# Patient Record
Sex: Female | Born: 1995 | Hispanic: No | Marital: Married | Smoking: Never smoker
Health system: Southern US, Community
[De-identification: ages and names within clinical notes are randomized; demographics above are authoritative.]

## PROBLEM LIST (undated history)

## (undated) ENCOUNTER — Inpatient Hospital Stay (HOSPITAL_COMMUNITY): Payer: Self-pay

## (undated) DIAGNOSIS — E119 Type 2 diabetes mellitus without complications: Secondary | ICD-10-CM

## (undated) DIAGNOSIS — D649 Anemia, unspecified: Secondary | ICD-10-CM

## (undated) DIAGNOSIS — R002 Palpitations: Secondary | ICD-10-CM

## (undated) DIAGNOSIS — F419 Anxiety disorder, unspecified: Secondary | ICD-10-CM

## (undated) DIAGNOSIS — G43909 Migraine, unspecified, not intractable, without status migrainosus: Secondary | ICD-10-CM

## (undated) DIAGNOSIS — J45909 Unspecified asthma, uncomplicated: Secondary | ICD-10-CM

## (undated) DIAGNOSIS — F32A Depression, unspecified: Secondary | ICD-10-CM

## (undated) HISTORY — DX: Unspecified asthma, uncomplicated: J45.909

## (undated) HISTORY — PX: CYST REMOVAL NECK: SHX6281

## (undated) HISTORY — PX: NECK SURGERY: SHX720

## (undated) HISTORY — DX: Palpitations: R00.2

## (undated) HISTORY — DX: Migraine, unspecified, not intractable, without status migrainosus: G43.909

---

## 2020-08-20 DIAGNOSIS — L249 Irritant contact dermatitis, unspecified cause: Secondary | ICD-10-CM | POA: Diagnosis not present

## 2020-09-10 DIAGNOSIS — Z3202 Encounter for pregnancy test, result negative: Secondary | ICD-10-CM | POA: Diagnosis not present

## 2020-09-14 DIAGNOSIS — Z3201 Encounter for pregnancy test, result positive: Secondary | ICD-10-CM | POA: Diagnosis not present

## 2020-09-15 DIAGNOSIS — Z3A00 Weeks of gestation of pregnancy not specified: Secondary | ICD-10-CM | POA: Diagnosis not present

## 2020-09-15 DIAGNOSIS — Z20822 Contact with and (suspected) exposure to covid-19: Secondary | ICD-10-CM | POA: Diagnosis not present

## 2020-09-15 DIAGNOSIS — O24311 Unspecified pre-existing diabetes mellitus in pregnancy, first trimester: Secondary | ICD-10-CM | POA: Diagnosis not present

## 2020-09-15 DIAGNOSIS — Z3A01 Less than 8 weeks gestation of pregnancy: Secondary | ICD-10-CM | POA: Diagnosis not present

## 2020-09-15 DIAGNOSIS — R109 Unspecified abdominal pain: Secondary | ICD-10-CM | POA: Diagnosis not present

## 2020-09-15 DIAGNOSIS — O26891 Other specified pregnancy related conditions, first trimester: Secondary | ICD-10-CM | POA: Diagnosis not present

## 2020-09-15 DIAGNOSIS — E1165 Type 2 diabetes mellitus with hyperglycemia: Secondary | ICD-10-CM | POA: Diagnosis not present

## 2020-09-15 DIAGNOSIS — R42 Dizziness and giddiness: Secondary | ICD-10-CM | POA: Diagnosis not present

## 2020-09-15 DIAGNOSIS — O99891 Other specified diseases and conditions complicating pregnancy: Secondary | ICD-10-CM | POA: Diagnosis not present

## 2020-09-15 DIAGNOSIS — Z794 Long term (current) use of insulin: Secondary | ICD-10-CM | POA: Diagnosis not present

## 2020-09-19 DIAGNOSIS — Z8759 Personal history of other complications of pregnancy, childbirth and the puerperium: Secondary | ICD-10-CM | POA: Diagnosis not present

## 2020-09-19 DIAGNOSIS — E119 Type 2 diabetes mellitus without complications: Secondary | ICD-10-CM | POA: Diagnosis not present

## 2020-09-19 DIAGNOSIS — Z3201 Encounter for pregnancy test, result positive: Secondary | ICD-10-CM | POA: Diagnosis not present

## 2020-09-19 DIAGNOSIS — Z3689 Encounter for other specified antenatal screening: Secondary | ICD-10-CM | POA: Diagnosis not present

## 2020-09-19 LAB — OB RESULTS CONSOLE HEPATITIS B SURFACE ANTIGEN: Hepatitis B Surface Ag: NEGATIVE

## 2020-09-19 LAB — OB RESULTS CONSOLE GC/CHLAMYDIA
Chlamydia: NEGATIVE
Gonorrhea: NEGATIVE

## 2020-09-19 LAB — OB RESULTS CONSOLE HIV ANTIBODY (ROUTINE TESTING): HIV: NONREACTIVE

## 2020-09-19 LAB — OB RESULTS CONSOLE RUBELLA ANTIBODY, IGM: Rubella: IMMUNE

## 2020-09-19 LAB — OB RESULTS CONSOLE ABO/RH: RH Type: POSITIVE

## 2020-09-19 LAB — OB RESULTS CONSOLE RPR: RPR: NONREACTIVE

## 2020-09-19 LAB — OB RESULTS CONSOLE ANTIBODY SCREEN: Antibody Screen: NEGATIVE

## 2020-09-19 LAB — HEPATITIS C ANTIBODY: HCV Ab: NEGATIVE

## 2020-09-21 DIAGNOSIS — Z8759 Personal history of other complications of pregnancy, childbirth and the puerperium: Secondary | ICD-10-CM | POA: Diagnosis not present

## 2020-09-27 DIAGNOSIS — K219 Gastro-esophageal reflux disease without esophagitis: Secondary | ICD-10-CM | POA: Diagnosis not present

## 2020-09-27 DIAGNOSIS — O200 Threatened abortion: Secondary | ICD-10-CM | POA: Diagnosis not present

## 2020-09-27 DIAGNOSIS — O24119 Pre-existing diabetes mellitus, type 2, in pregnancy, unspecified trimester: Secondary | ICD-10-CM | POA: Diagnosis not present

## 2020-10-11 DIAGNOSIS — Z3201 Encounter for pregnancy test, result positive: Secondary | ICD-10-CM | POA: Diagnosis not present

## 2020-10-17 ENCOUNTER — Other Ambulatory Visit: Payer: Self-pay

## 2020-10-17 ENCOUNTER — Encounter: Payer: Self-pay | Attending: Obstetrics | Admitting: Registered"

## 2020-10-17 ENCOUNTER — Encounter: Payer: Self-pay | Admitting: Registered"

## 2020-10-17 DIAGNOSIS — O24119 Pre-existing diabetes mellitus, type 2, in pregnancy, unspecified trimester: Secondary | ICD-10-CM | POA: Insufficient documentation

## 2020-10-17 NOTE — Progress Notes (Signed)
Patient has Type 2 Diabetes in Pregnancy and was given the option to have a one on one visit with dietitian. Patient decided to stay for Gestational Diabetes self-management class. Much of the same information applies to Type 2 in pregnancy. Patient stayed after class for some additional information regarding the difference between gestational diabetes and Type 2 diabetes in pregnancy.  The following learning objectives were met by the patient during this course:  States the definition of Gestational Diabetes States why dietary management is important in controlling blood glucose Describes the effects each nutrient has on blood glucose levels Demonstrates ability to create a balanced meal plan Demonstrates carbohydrate counting  States when to check blood glucose levels Demonstrates proper blood glucose monitoring techniques States the effect of stress and exercise on blood glucose levels States the importance of limiting caffeine and abstaining from alcohol and smoking  Blood glucose monitor given: Patient has meter and is checking blood sugar prior to class   Patient instructed to monitor glucose levels: FBS: 60 - <95; 1 hour: <140; 2 hour: <120  Patient received handouts: Nutrition Diabetes and Pregnancy, including carb counting list A1c chart with diagnosing criteria for Type 2 Diabetes  Patient will be seen for follow-up as needed.

## 2020-10-31 DIAGNOSIS — Z3689 Encounter for other specified antenatal screening: Secondary | ICD-10-CM | POA: Diagnosis not present

## 2020-10-31 DIAGNOSIS — O24111 Pre-existing diabetes mellitus, type 2, in pregnancy, first trimester: Secondary | ICD-10-CM | POA: Diagnosis not present

## 2020-10-31 DIAGNOSIS — Z3A10 10 weeks gestation of pregnancy: Secondary | ICD-10-CM | POA: Diagnosis not present

## 2020-11-16 DIAGNOSIS — Z23 Encounter for immunization: Secondary | ICD-10-CM | POA: Diagnosis not present

## 2020-11-16 DIAGNOSIS — Z3A12 12 weeks gestation of pregnancy: Secondary | ICD-10-CM | POA: Diagnosis not present

## 2020-11-16 DIAGNOSIS — O368990 Maternal care for other specified fetal problems, unspecified trimester, not applicable or unspecified: Secondary | ICD-10-CM | POA: Diagnosis not present

## 2020-11-29 DIAGNOSIS — U071 COVID-19: Secondary | ICD-10-CM | POA: Diagnosis not present

## 2020-11-29 DIAGNOSIS — R070 Pain in throat: Secondary | ICD-10-CM | POA: Diagnosis not present

## 2020-11-29 DIAGNOSIS — Z20822 Contact with and (suspected) exposure to covid-19: Secondary | ICD-10-CM | POA: Diagnosis not present

## 2020-12-12 DIAGNOSIS — O26892 Other specified pregnancy related conditions, second trimester: Secondary | ICD-10-CM | POA: Diagnosis not present

## 2020-12-12 DIAGNOSIS — O26899 Other specified pregnancy related conditions, unspecified trimester: Secondary | ICD-10-CM | POA: Diagnosis not present

## 2020-12-12 DIAGNOSIS — Z361 Encounter for antenatal screening for raised alphafetoprotein level: Secondary | ICD-10-CM | POA: Diagnosis not present

## 2020-12-12 DIAGNOSIS — Z3A16 16 weeks gestation of pregnancy: Secondary | ICD-10-CM | POA: Diagnosis not present

## 2020-12-12 DIAGNOSIS — O24419 Gestational diabetes mellitus in pregnancy, unspecified control: Secondary | ICD-10-CM | POA: Diagnosis not present

## 2020-12-22 ENCOUNTER — Emergency Department (HOSPITAL_COMMUNITY)
Admission: EM | Admit: 2020-12-22 | Discharge: 2020-12-22 | Disposition: A | Discharge status: Home / Self Care | Payer: BC Managed Care – PPO | Attending: Emergency Medicine | Admitting: Emergency Medicine

## 2020-12-22 ENCOUNTER — Emergency Department (HOSPITAL_COMMUNITY): Payer: BC Managed Care – PPO

## 2020-12-22 ENCOUNTER — Encounter (HOSPITAL_COMMUNITY): Payer: Self-pay

## 2020-12-22 DIAGNOSIS — O209 Hemorrhage in early pregnancy, unspecified: Secondary | ICD-10-CM | POA: Insufficient documentation

## 2020-12-22 DIAGNOSIS — Z3A18 18 weeks gestation of pregnancy: Secondary | ICD-10-CM | POA: Insufficient documentation

## 2020-12-22 DIAGNOSIS — K5641 Fecal impaction: Secondary | ICD-10-CM

## 2020-12-22 DIAGNOSIS — N939 Abnormal uterine and vaginal bleeding, unspecified: Secondary | ICD-10-CM

## 2020-12-22 DIAGNOSIS — O99612 Diseases of the digestive system complicating pregnancy, second trimester: Secondary | ICD-10-CM | POA: Diagnosis not present

## 2020-12-22 DIAGNOSIS — K5900 Constipation, unspecified: Secondary | ICD-10-CM | POA: Insufficient documentation

## 2020-12-22 LAB — CBC WITH DIFFERENTIAL/PLATELET
Abs Immature Granulocytes: 0.05 10*3/uL (ref 0.00–0.07)
Basophils Absolute: 0 10*3/uL (ref 0.0–0.1)
Basophils Relative: 0 %
Eosinophils Absolute: 0.1 10*3/uL (ref 0.0–0.5)
Eosinophils Relative: 0 %
HCT: 37.6 % (ref 36.0–46.0)
Hemoglobin: 12.8 g/dL (ref 12.0–15.0)
Immature Granulocytes: 0 %
Lymphocytes Relative: 18 %
Lymphs Abs: 2 10*3/uL (ref 0.7–4.0)
MCH: 29 pg (ref 26.0–34.0)
MCHC: 34 g/dL (ref 30.0–36.0)
MCV: 85.1 fL (ref 80.0–100.0)
Monocytes Absolute: 0.7 10*3/uL (ref 0.1–1.0)
Monocytes Relative: 6 %
Neutro Abs: 8.7 10*3/uL — ABNORMAL HIGH (ref 1.7–7.7)
Neutrophils Relative %: 76 %
Platelets: 248 10*3/uL (ref 150–400)
RBC: 4.42 MIL/uL (ref 3.87–5.11)
RDW: 13.2 % (ref 11.5–15.5)
WBC: 11.6 10*3/uL — ABNORMAL HIGH (ref 4.0–10.5)
nRBC: 0 % (ref 0.0–0.2)

## 2020-12-22 LAB — COMPREHENSIVE METABOLIC PANEL
ALT: 15 U/L (ref 0–44)
AST: 24 U/L (ref 15–41)
Albumin: 3.5 g/dL (ref 3.5–5.0)
Alkaline Phosphatase: 44 U/L (ref 38–126)
Anion gap: 9 (ref 5–15)
BUN: 14 mg/dL (ref 6–20)
CO2: 18 mmol/L — ABNORMAL LOW (ref 22–32)
Calcium: 9.2 mg/dL (ref 8.9–10.3)
Chloride: 107 mmol/L (ref 98–111)
Creatinine, Ser: 0.51 mg/dL (ref 0.44–1.00)
GFR, Estimated: >60 mL/min (ref >60)
Glucose, Bld: 121 mg/dL — ABNORMAL HIGH (ref 70–99)
Potassium: 4 mmol/L (ref 3.5–5.1)
Sodium: 134 mmol/L — ABNORMAL LOW (ref 135–145)
Total Bilirubin: 0.5 mg/dL (ref 0.3–1.2)
Total Protein: 7.7 g/dL (ref 6.5–8.1)

## 2020-12-22 LAB — PROTIME-INR
INR: 0.9 (ref 0.8–1.2)
Prothrombin Time: 12.5 seconds (ref 11.4–15.2)

## 2020-12-22 LAB — HCG, QUANTITATIVE, PREGNANCY: hCG, Beta Chain, Quant, S: 30317 m[IU]/mL — ABNORMAL HIGH (ref <5)

## 2020-12-22 LAB — TYPE AND SCREEN
ABO/RH(D): O POS
Antibody Screen: NEGATIVE

## 2020-12-22 MED ORDER — FLEET ENEMA 7-19 GM/118ML RE ENEM
1.0000 | ENEMA | Freq: Once | RECTAL | Status: AC
  Administered 2020-12-22: 1 via RECTAL
  Filled 2020-12-22: qty 1

## 2020-12-22 NOTE — ED Provider Notes (Signed)
[facility]-EMERGENCY DEPT Provider Note   CSN: [id number] Arrival date & time: 12/22/20  7412     History Chief Complaint  Patient presents with   Constipation    Allison Friedman is a 25 y.o. female.  HPI    25 year old female comes in with chief complaint of constipation and questionable bleeding.  Patient is G2 P1, her first pregnancy ended in miscarriage.  She reports that she has been constipated throughout this pregnancy.  Today when she was straining, she felt like she had stretching sensation around her rectum.  She also noted that there was blood in the commode.  She is unsure if she had vaginal bleeding or bleeding from her rectum due to constipation and straining.  So far there has not been any complications from this pregnancy.   History reviewed. No pertinent past medical history.  There are no problems to display for this patient.   History reviewed. No pertinent surgical history.   OB History     Gravida  1   Para      Term      Preterm      AB      Living         SAB      IAB      Ectopic      Multiple      Live Births              History reviewed. No pertinent family history.  Social History   Tobacco Use   Smoking status: Never   Smokeless tobacco: Never    Home Medications Prior to Admission medications   Not on File    Allergies    Patient has no known allergies.  Review of Systems   Review of Systems  Constitutional:  Positive for activity change.  Gastrointestinal:  Positive for constipation.  All other systems reviewed and are negative.  Physical Exam Updated Vital Signs BP 123/74   Pulse 96   Temp 99.2 F (37.3 C) (Oral)   Resp 20   SpO2 97%   Physical Exam Vitals and nursing note reviewed.  Constitutional:      Appearance: She is well-developed.  HENT:     Head: Atraumatic.  Cardiovascular:     Rate and Rhythm: Normal rate.  Pulmonary:     Effort: Pulmonary effort is  normal.  Abdominal:     Comments: Fecal impaction appreciated  Musculoskeletal:     Cervical back: Normal range of motion and neck supple.  Skin:    General: Skin is warm and dry.  Neurological:     Mental Status: She is alert and oriented to person, place, and time.    ED Results / Procedures / Treatments   Labs (all labs ordered are listed, but only abnormal results are displayed) Labs Reviewed  COMPREHENSIVE METABOLIC PANEL - Abnormal; Notable for the following components:      Result Value   Sodium 134 (*)    CO2 18 (*)    Glucose, Bld 121 (*)    All other components within normal limits  CBC WITH DIFFERENTIAL/PLATELET - Abnormal; Notable for the following components:   WBC 11.6 (*)    Neutro Abs 8.7 (*)    All other components within normal limits  HCG, QUANTITATIVE, PREGNANCY - Abnormal; Notable for the following components:   hCG, Beta Chain, Quant, S 30,317 (*)    All other components within normal limits  PROTIME-INR  TYPE AND SCREEN  ABO/RH    EKG None  Radiology US OB Comp + 14 Wk  Result Date: 12/22/2020 CLINICAL DATA:  Vaginal bleeding EXAM: OBSTETRIC <14 WK ULTRASOUND TECHNIQUE: Transabdominal ultrasound was performed for evaluation of the gestation as well as the maternal uterus and adnexal regions. COMPARISON:  09/15/2020 FINDINGS: Single live intrauterine gestation is seen. Placenta is along the anterior wall. Amount of fluid around the fetus is unremarkable. Cervix is closed. Heart Rate: 149 bpm Biparietal diameter is 4.06 cm suggesting gestational age of [redacted] weeks 2 days. Estimated date of delivery is 05/23/2021. Subchorionic hemorrhage:  None visualized. Maternal uterus/adnexae: Uterus is unremarkable. Ovaries are not adequately visualized. There are no adnexal masses. There is no free fluid in the pelvis. IMPRESSION: Single live intrauterine pregnancy seen. Sonographically estimated gestational age is 18 weeks 2 days. There is no demonstrable subchorionic  bleed. Cervix is closed. Electronically Signed   By: Ernie Avena M.D.   On: 12/22/2020 14:02    Procedures Fecal disimpaction  Date/Time: 12/22/2020 3:30 PM Performed by: Derwood Kaplan, MD Authorized by: Derwood Kaplan, MD  Consent: Verbal consent obtained. Risks and benefits: risks, benefits and alternatives were discussed Consent given by: patient Patient understanding: patient states understanding of the procedure being performed Patient identity confirmed: arm band Preparation: Patient was prepped and draped in the usual sterile fashion. Local anesthesia used: no  Anesthesia: Local anesthesia used: no  Sedation: Patient sedated: no  Patient tolerance: patient tolerated the procedure well with no immediate complications     Medications Ordered in ED Medications  sodium phosphate (FLEET) 7-19 GM/118ML enema 1 enema (1 enema Rectal Given 12/22/20 4356)    ED Course  I have reviewed the triage vital signs and the nursing notes.  Pertinent labs & imaging results that were available during my care of the patient were reviewed by me and considered in my medical decision making (see chart for details).    MDM Rules/Calculators/A&P                          25 year old female comes in with chief complaint of constipation.  She was noted to have fecal impaction.  She was manually disimpacted.  We did not see any blood in the stool.  Patient did notice some blood in her commode earlier today and is concerned about vaginal bleeding.  She had miscarriage with her first pregnancy and wants to make sure everything is fine with this pregnancy.  No pelvic pain, back pain.  After much discussion with the patient, ultrasound pelvis was ordered and it confirms successful IUP.  No evidence of any placental disruption or chorionic bleeding.  Patient was reassured. She also received enema, there after she had a large BM and which was helpful in improving her symptoms.  Stable for  discharge.  Final Clinical Impression(s) / ED Diagnoses Final diagnoses:  Constipation during pregnancy in second trimester  Fecal impaction [facility])  Vaginal bleeding    Rx / DC Orders ED Discharge Orders     None        Derwood Kaplan, MD 12/22/20 1532

## 2020-12-22 NOTE — Discharge Instructions (Addendum)
Ultrasound shows 18 weeks and 2 day old fetus/baby. Hydrate well.

## 2020-12-22 NOTE — ED Triage Notes (Signed)
Pt arrived via POV, c/o constipation since yesterday. States she also saw some bright red blood while trying to go to bathroom. States she has been able to have bowel mvmts this week but stool has been hard.   States [redacted]wks pregnant

## 2021-01-07 DIAGNOSIS — Z3A20 20 weeks gestation of pregnancy: Secondary | ICD-10-CM | POA: Diagnosis not present

## 2021-01-07 DIAGNOSIS — Z363 Encounter for antenatal screening for malformations: Secondary | ICD-10-CM | POA: Diagnosis not present

## 2021-01-07 DIAGNOSIS — R5383 Other fatigue: Secondary | ICD-10-CM | POA: Diagnosis not present

## 2021-01-24 DIAGNOSIS — Z362 Encounter for other antenatal screening follow-up: Secondary | ICD-10-CM | POA: Diagnosis not present

## 2021-02-20 DIAGNOSIS — Z3689 Encounter for other specified antenatal screening: Secondary | ICD-10-CM | POA: Diagnosis not present

## 2021-02-20 DIAGNOSIS — O24419 Gestational diabetes mellitus in pregnancy, unspecified control: Secondary | ICD-10-CM | POA: Diagnosis not present

## 2021-03-08 DIAGNOSIS — Z3689 Encounter for other specified antenatal screening: Secondary | ICD-10-CM | POA: Diagnosis not present

## 2021-03-08 DIAGNOSIS — Z362 Encounter for other antenatal screening follow-up: Secondary | ICD-10-CM | POA: Diagnosis not present

## 2021-03-08 DIAGNOSIS — Z23 Encounter for immunization: Secondary | ICD-10-CM | POA: Diagnosis not present

## 2021-03-12 ENCOUNTER — Other Ambulatory Visit: Payer: Self-pay

## 2021-03-12 ENCOUNTER — Inpatient Hospital Stay (HOSPITAL_COMMUNITY)
Admission: AD | Admit: 2021-03-12 | Discharge: 2021-03-12 | Disposition: A | Discharge status: Home / Self Care | Payer: BC Managed Care – PPO | Attending: Obstetrics and Gynecology | Admitting: Obstetrics and Gynecology

## 2021-03-12 ENCOUNTER — Encounter (HOSPITAL_COMMUNITY): Payer: Self-pay | Admitting: Obstetrics and Gynecology

## 2021-03-12 DIAGNOSIS — Z3A29 29 weeks gestation of pregnancy: Secondary | ICD-10-CM | POA: Diagnosis not present

## 2021-03-12 DIAGNOSIS — O24113 Pre-existing diabetes mellitus, type 2, in pregnancy, third trimester: Secondary | ICD-10-CM | POA: Insufficient documentation

## 2021-03-12 DIAGNOSIS — O368130 Decreased fetal movements, third trimester, not applicable or unspecified: Secondary | ICD-10-CM | POA: Insufficient documentation

## 2021-03-12 HISTORY — DX: Type 2 diabetes mellitus without complications: E11.9

## 2021-03-12 HISTORY — DX: Anemia, unspecified: D64.9

## 2021-03-12 NOTE — MAU Note (Addendum)
..  Allison Friedman is a 25 y.o. at [redacted]w[redacted]d here in MAU reporting: DFM - last movement felt at 0600 on 03/11/2021. Pt states baby is usually very active. Pt reports having eaten and is hydrated without change in movement status. Pt denies SROM, vaginal bleeding or bloody show. Pt denies pain, contractions or cramping.  Vitals:   03/12/21 0237  BP: (!) 146/83  Pulse: 90  Resp: 18  Temp: 98.6 F (37 C)  SpO2: 100%     FHT:152

## 2021-03-12 NOTE — MAU Provider Note (Signed)
History     CSN: [id number]  Arrival date and time: 03/12/21 0221   None     Chief Complaint  Patient presents with   Decreased Fetal Movement   HPI Allison Friedman is a 26 y.o. G1P0 at [redacted]w[redacted]d who presents to MAU for decreased fetal movement. Pregnancy is significant for T2DM Patient reports prior to MAU arrival, she had not felt baby move since 0600 yesterday morning. She has tried eating and drinking, but nothing has helped. Since her arrival to MAU, she reports she has felt baby move several times. She denies contractions, leaking fluid, or vaginal bleeding. No headaches, visual disturbances or RUQ pain.  OB History     Gravida  2   Para      Term      Preterm      AB  1   Living  0      SAB  1   IAB      Ectopic      Multiple      Live Births              Past Medical History:  Diagnosis Date   Anemia    Diabetes mellitus without complication (HCC)     Past Surgical History:  Procedure Laterality Date   NECK SURGERY      Family History  Problem Relation Age of Onset   Hypertension Mother     Social History   Tobacco Use   Smoking status: Never   Smokeless tobacco: Never  Vaping Use   Vaping Use: Never used  Substance Use Topics   Alcohol use: Never   Drug use: Never    Allergies: No Known Allergies  Medications Prior to Admission  Medication Sig Dispense Refill Last Dose   ferrous sulfate 325 (65 FE) MG tablet Take 325 mg by mouth daily with breakfast.   03/11/2021   insulin glargine (LANTUS) 100 UNIT/ML injection Inject 40 Units into the skin at bedtime.   03/11/2021   Pediatric Multiple Vitamins (FLINSTONES GUMMIES OMEGA-3 DHA PO) Take by mouth.   03/11/2021    Review of Systems  Constitutional: Negative.   Respiratory: Negative.    Cardiovascular: Negative.   Gastrointestinal: Negative.   Genitourinary: Negative.   Musculoskeletal: Negative.   Skin: Negative.   Neurological: Negative.    Physical Exam   Patient Vitals  for the past 24 hrs:  BP Temp Temp src Pulse Resp SpO2 Height Weight  03/12/21 0450 117/70 98.4 F (36.9 C) Oral 86 17 99 % -- --  03/12/21 0331 103/74 -- -- 83 -- -- -- --  03/12/21 0324 111/65 -- -- 82 -- -- -- --  03/12/21 0306 (!) 95/41 98.7 F (37.1 C) Oral 87 18 98 % -- --  03/12/21 0237 (!) 146/83 98.6 F (37 C) Oral 90 18 100 % 5\' 4"  (1.626 m) 132 kg   Physical Exam Vitals and nursing note reviewed.  Constitutional:      General: She is not in acute distress.    Appearance: She is obese.  Eyes:     Extraocular Movements: Extraocular movements intact.     Pupils: Pupils are equal, round, and reactive to light.  Cardiovascular:     Rate and Rhythm: Normal rate.  Pulmonary:     Effort: Pulmonary effort is normal.  Abdominal:     Palpations: Abdomen is soft.     Tenderness: There is no abdominal tenderness.     Comments: Gravid   Musculoskeletal:  General: Normal range of motion.     Cervical back: Normal range of motion.  Skin:    General: Skin is warm and dry.  Neurological:     General: No focal deficit present.     Mental Status: She is alert and oriented to person, place, and time.  Psychiatric:        Mood and Affect: Mood normal.        Behavior: Behavior normal.        Thought Content: Thought content normal.        Judgment: Judgment normal.   NST FHR: 140 bpm, moderate variability, +10x10 accels, no decels Toco: quiet  MAU Course  Procedures NST  MDM NST reassuring for gestational age. NST clicker given to patient at approximately 0315 and pushed 20 times in 1 hr. Patient reports feeling normal fetal movement since arrival to MAU and being placed on monitors. Initial BP elevated, however subsequent BP's normotensive. Patient asymptomatic.   Assessment and Plan  [redacted] weeks gestation of pregnancy Decreased fetal movement   - Discharge home in stable condition - Reviewed fetal kick counts - Strict return precautions reviewed. Return to MAU as  needed for worsening symptoms - Keep OB appointment as scheduled    Brand Males, CNM 03/12/2021, 4:53 AM

## 2021-03-18 DIAGNOSIS — Z3A30 30 weeks gestation of pregnancy: Secondary | ICD-10-CM | POA: Diagnosis not present

## 2021-03-18 DIAGNOSIS — Z0371 Encounter for suspected problem with amniotic cavity and membrane ruled out: Secondary | ICD-10-CM | POA: Diagnosis not present

## 2021-03-18 DIAGNOSIS — O24419 Gestational diabetes mellitus in pregnancy, unspecified control: Secondary | ICD-10-CM | POA: Diagnosis not present

## 2021-03-27 DIAGNOSIS — O24113 Pre-existing diabetes mellitus, type 2, in pregnancy, third trimester: Secondary | ICD-10-CM | POA: Diagnosis not present

## 2021-03-27 DIAGNOSIS — Z3A31 31 weeks gestation of pregnancy: Secondary | ICD-10-CM | POA: Diagnosis not present

## 2021-04-03 DIAGNOSIS — O24419 Gestational diabetes mellitus in pregnancy, unspecified control: Secondary | ICD-10-CM | POA: Diagnosis not present

## 2021-04-03 DIAGNOSIS — Z3A32 32 weeks gestation of pregnancy: Secondary | ICD-10-CM | POA: Diagnosis not present

## 2021-04-04 DIAGNOSIS — R109 Unspecified abdominal pain: Secondary | ICD-10-CM | POA: Diagnosis not present

## 2021-04-04 DIAGNOSIS — Z3A32 32 weeks gestation of pregnancy: Secondary | ICD-10-CM | POA: Diagnosis not present

## 2021-04-04 DIAGNOSIS — O368130 Decreased fetal movements, third trimester, not applicable or unspecified: Secondary | ICD-10-CM | POA: Diagnosis not present

## 2021-04-04 DIAGNOSIS — O24113 Pre-existing diabetes mellitus, type 2, in pregnancy, third trimester: Secondary | ICD-10-CM | POA: Diagnosis not present

## 2021-04-08 DIAGNOSIS — E119 Type 2 diabetes mellitus without complications: Secondary | ICD-10-CM | POA: Diagnosis not present

## 2021-04-08 DIAGNOSIS — Z3A33 33 weeks gestation of pregnancy: Secondary | ICD-10-CM | POA: Diagnosis not present

## 2021-04-08 DIAGNOSIS — O24113 Pre-existing diabetes mellitus, type 2, in pregnancy, third trimester: Secondary | ICD-10-CM | POA: Diagnosis not present

## 2021-04-11 DIAGNOSIS — Z3A33 33 weeks gestation of pregnancy: Secondary | ICD-10-CM | POA: Diagnosis not present

## 2021-04-11 DIAGNOSIS — O24113 Pre-existing diabetes mellitus, type 2, in pregnancy, third trimester: Secondary | ICD-10-CM | POA: Diagnosis not present

## 2021-04-16 DIAGNOSIS — O24113 Pre-existing diabetes mellitus, type 2, in pregnancy, third trimester: Secondary | ICD-10-CM | POA: Diagnosis not present

## 2021-04-16 DIAGNOSIS — Z3A34 34 weeks gestation of pregnancy: Secondary | ICD-10-CM | POA: Diagnosis not present

## 2021-04-17 ENCOUNTER — Inpatient Hospital Stay (HOSPITAL_BASED_OUTPATIENT_CLINIC_OR_DEPARTMENT_OTHER): Payer: BC Managed Care – PPO

## 2021-04-17 ENCOUNTER — Other Ambulatory Visit: Payer: Self-pay

## 2021-04-17 ENCOUNTER — Encounter (HOSPITAL_COMMUNITY): Payer: Self-pay | Admitting: Obstetrics and Gynecology

## 2021-04-17 ENCOUNTER — Inpatient Hospital Stay (HOSPITAL_COMMUNITY)
Admission: AD | Admit: 2021-04-17 | Discharge: 2021-04-17 | Disposition: A | Discharge status: Home / Self Care | Payer: BC Managed Care – PPO | Attending: Obstetrics and Gynecology | Admitting: Obstetrics and Gynecology

## 2021-04-17 DIAGNOSIS — O368130 Decreased fetal movements, third trimester, not applicable or unspecified: Secondary | ICD-10-CM

## 2021-04-17 DIAGNOSIS — E119 Type 2 diabetes mellitus without complications: Secondary | ICD-10-CM | POA: Diagnosis not present

## 2021-04-17 DIAGNOSIS — O24113 Pre-existing diabetes mellitus, type 2, in pregnancy, third trimester: Secondary | ICD-10-CM | POA: Diagnosis not present

## 2021-04-17 DIAGNOSIS — Z3A34 34 weeks gestation of pregnancy: Secondary | ICD-10-CM

## 2021-04-17 DIAGNOSIS — K5900 Constipation, unspecified: Secondary | ICD-10-CM | POA: Diagnosis not present

## 2021-04-17 DIAGNOSIS — E1165 Type 2 diabetes mellitus with hyperglycemia: Secondary | ICD-10-CM | POA: Diagnosis not present

## 2021-04-17 DIAGNOSIS — O99613 Diseases of the digestive system complicating pregnancy, third trimester: Secondary | ICD-10-CM | POA: Insufficient documentation

## 2021-04-17 DIAGNOSIS — Z3689 Encounter for other specified antenatal screening: Secondary | ICD-10-CM

## 2021-04-17 LAB — URINALYSIS, ROUTINE W REFLEX MICROSCOPIC
Bilirubin Urine: NEGATIVE
Glucose, UA: 50 mg/dL — AB
Hgb urine dipstick: NEGATIVE
Ketones, ur: 5 mg/dL — AB
Nitrite: NEGATIVE
Protein, ur: 30 mg/dL — AB
Specific Gravity, Urine: 1.03 (ref 1.005–1.030)
pH: 6 (ref 5.0–8.0)

## 2021-04-17 NOTE — MAU Note (Signed)
Pt reports she has only felt her baby move once today around 2pm. C/o mild abd cramping as well. Denies any vag bleeding or leaking

## 2021-04-17 NOTE — MAU Provider Note (Addendum)
History     CSN: [id number]  Arrival date and time: 04/17/21 1805   Event Date/Time   First Provider Initiated Contact with Patient 04/17/21 1840      Chief Complaint  Patient presents with   Decreased Fetal Movement   HPI Patient Allison Friedman is 26 y.o. G2P0010  At [redacted]w[redacted]d here with complaints of decreased fetal movements. Baby normally moves in the morning time and at night time. She feels like the movements were decreased on Monday (two days ago). Then, on Tuesday (yesterday), she had some movements. She told Dr. Ernestina Penna yesterday; she had a BPP where she scored "perfect" per patient. She denies any blurry vision, floating spots, she denies NV, diarrhea, chest pain, fever. She denies VB, contractions, LOF. She endorses constipation. She reports that her blood sugar has not been well controlled; she has an appt tomorrow at [facility]. She reports that she went to [employer] today and she went out for lunch for her husband. She reports that she then "laid down in bed" and tried to get her to move. Patient reports that she tried laying down and rubbing the top of her belly to get her to move. She says that she laid there for at least 1.5 hours. She called [facility] this afternoon and they told her to come into MAU.   OB History     Gravida  2   Para      Term      Preterm      AB  1   Living  0      SAB  1   IAB      Ectopic      Multiple      Live Births              Past Medical History:  Diagnosis Date   Anemia    Diabetes mellitus without complication (HCC)     Past Surgical History:  Procedure Laterality Date   NECK SURGERY      Family History  Problem Relation Age of Onset   Hypertension Mother     Social History   Tobacco Use   Smoking status: Never   Smokeless tobacco: Never  Vaping Use   Vaping Use: Never used  Substance Use Topics   Alcohol use: Never   Drug use: Never    Allergies: No Known Allergies  Medications Prior to Admission   Medication Sig Dispense Refill Last Dose   ferrous sulfate 325 (65 FE) MG tablet Take 325 mg by mouth daily with breakfast.   04/17/2021   insulin glargine (LANTUS) 100 UNIT/ML injection Inject 56 Units into the skin at bedtime.   04/16/2021   metFORMIN (GLUCOPHAGE) 500 MG tablet Take by mouth daily after supper.      Pediatric Multiple Vitamins (FLINSTONES GUMMIES OMEGA-3 DHA PO) Take by mouth.   04/17/2021    Review of Systems  Constitutional: Negative.   HENT: Negative.    Eyes: Negative.   Respiratory: Negative.    Cardiovascular: Negative.   Genitourinary: Negative.   Musculoskeletal: Negative.   Neurological: Negative.   Hematological: Negative.   Psychiatric/Behavioral: Negative.    Physical Exam   Blood pressure 107/65, pulse 99, temperature 98.3 F (36.8 C), temperature source Oral, resp. rate 18.  Physical Exam Cardiovascular:     Rate and Rhythm: Normal rate.  Pulmonary:     Effort: Pulmonary effort is normal.  Abdominal:     General: Abdomen is flat.  Genitourinary:  General: Normal vulva.  Skin:    General: Skin is warm.  Neurological:     General: No focal deficit present.     Mental Status: She is alert.  Psychiatric:        Mood and Affect: Mood normal.  Cervix checked upon arrival in MAU due to complaints of occasional side pain, cervix is closed, long, thick.   MAU Course  Procedures  MDM -NST: 140 bpm, mod var, present acel, no decels, no contractions  Patient still endorsing decreased fetal movements; based on poorly controlled DM and report of decreased fetal movements, will send for BPP.   BPP: 8/8 Assessment and Plan   1. NST (non-stress test) reactive   2. [redacted] weeks gestation of pregnancy   -patient has appt with [facility] tomorrow -reviewed BPP results with patient, discussed fetal movements and when to come to MAU. Reassured patient that she can come to MAU whenever she is concerned about movement, and that checking her BS and managing  her DM is the best thing she can do for her baby.  -reviewed other third trimester warning signs; patient verbalized understanding    Marylene Land 04/17/2021, 6:43 PM

## 2021-04-18 DIAGNOSIS — O24113 Pre-existing diabetes mellitus, type 2, in pregnancy, third trimester: Secondary | ICD-10-CM | POA: Diagnosis not present

## 2021-04-18 DIAGNOSIS — O26819 Pregnancy related exhaustion and fatigue, unspecified trimester: Secondary | ICD-10-CM | POA: Diagnosis not present

## 2021-04-18 DIAGNOSIS — O368130 Decreased fetal movements, third trimester, not applicable or unspecified: Secondary | ICD-10-CM | POA: Diagnosis not present

## 2021-04-18 DIAGNOSIS — Z3A34 34 weeks gestation of pregnancy: Secondary | ICD-10-CM | POA: Diagnosis not present

## 2021-04-18 DIAGNOSIS — O26813 Pregnancy related exhaustion and fatigue, third trimester: Secondary | ICD-10-CM | POA: Diagnosis not present

## 2021-04-22 DIAGNOSIS — Z3A35 35 weeks gestation of pregnancy: Secondary | ICD-10-CM | POA: Diagnosis not present

## 2021-04-22 DIAGNOSIS — Z3685 Encounter for antenatal screening for Streptococcus B: Secondary | ICD-10-CM | POA: Diagnosis not present

## 2021-04-22 DIAGNOSIS — R1011 Right upper quadrant pain: Secondary | ICD-10-CM | POA: Diagnosis not present

## 2021-04-22 DIAGNOSIS — O24113 Pre-existing diabetes mellitus, type 2, in pregnancy, third trimester: Secondary | ICD-10-CM | POA: Diagnosis not present

## 2021-04-22 LAB — OB RESULTS CONSOLE GBS: GBS: POSITIVE

## 2021-04-25 DIAGNOSIS — Z3A35 35 weeks gestation of pregnancy: Secondary | ICD-10-CM | POA: Diagnosis not present

## 2021-04-25 DIAGNOSIS — O24113 Pre-existing diabetes mellitus, type 2, in pregnancy, third trimester: Secondary | ICD-10-CM | POA: Diagnosis not present

## 2021-04-29 ENCOUNTER — Telehealth (HOSPITAL_COMMUNITY): Payer: Self-pay | Admitting: *Deleted

## 2021-04-29 NOTE — Telephone Encounter (Signed)
Preadmission screen  

## 2021-04-30 ENCOUNTER — Telehealth (HOSPITAL_COMMUNITY): Payer: Self-pay | Admitting: *Deleted

## 2021-04-30 DIAGNOSIS — Z3685 Encounter for antenatal screening for Streptococcus B: Secondary | ICD-10-CM | POA: Diagnosis not present

## 2021-04-30 NOTE — Telephone Encounter (Signed)
Preadmission screen  

## 2021-05-01 ENCOUNTER — Telehealth (HOSPITAL_COMMUNITY): Payer: Self-pay | Admitting: *Deleted

## 2021-05-01 NOTE — Telephone Encounter (Signed)
Preadmission screen  

## 2021-05-02 ENCOUNTER — Inpatient Hospital Stay (HOSPITAL_COMMUNITY)
Admission: AD | Admit: 2021-05-02 | Discharge: 2021-05-02 | Disposition: A | Discharge status: Home / Self Care | Payer: BC Managed Care – PPO | Attending: Obstetrics & Gynecology | Admitting: Obstetrics & Gynecology

## 2021-05-02 ENCOUNTER — Encounter (HOSPITAL_COMMUNITY): Payer: Self-pay | Admitting: Obstetrics & Gynecology

## 2021-05-02 ENCOUNTER — Encounter (HOSPITAL_COMMUNITY): Payer: Self-pay | Admitting: *Deleted

## 2021-05-02 ENCOUNTER — Other Ambulatory Visit: Payer: Self-pay

## 2021-05-02 ENCOUNTER — Telehealth (HOSPITAL_COMMUNITY): Payer: Self-pay | Admitting: *Deleted

## 2021-05-02 DIAGNOSIS — Z3689 Encounter for other specified antenatal screening: Secondary | ICD-10-CM

## 2021-05-02 DIAGNOSIS — Z3A36 36 weeks gestation of pregnancy: Secondary | ICD-10-CM | POA: Diagnosis not present

## 2021-05-02 DIAGNOSIS — O368130 Decreased fetal movements, third trimester, not applicable or unspecified: Secondary | ICD-10-CM | POA: Diagnosis not present

## 2021-05-02 LAB — URINALYSIS, ROUTINE W REFLEX MICROSCOPIC
Bilirubin Urine: NEGATIVE
Glucose, UA: NEGATIVE mg/dL
Hgb urine dipstick: NEGATIVE
Ketones, ur: NEGATIVE mg/dL
Leukocytes,Ua: NEGATIVE
Nitrite: NEGATIVE
Protein, ur: NEGATIVE mg/dL
Specific Gravity, Urine: 1.02 (ref 1.005–1.030)
pH: 8 (ref 5.0–8.0)

## 2021-05-02 NOTE — Telephone Encounter (Signed)
Preadmission screen  

## 2021-05-02 NOTE — MAU Note (Signed)
Allison Friedman is a 26 y.o. at [redacted]w[redacted]d here in MAU reporting: "I haven't felt her move since 1 o'clock today.. I'm just worried about her". States she ate sweet food, drank lemonade, laid down, but nothing has made baby move. Denies VB or LOF.  ? ?Onset of complaint: 1400 ?Pain score: 5 - pelvic pressure  ?Vitals:  ? 05/02/21 2004  ?BP: 122/63  ?Pulse: 70  ?Resp: 20  ?Temp: 98.7 ?F (37.1 ?C)  ?SpO2: 99%  ?   ?FHT:150 ?Lab orders placed from triage: U/A  ? ?

## 2021-05-02 NOTE — MAU Provider Note (Signed)
?History  ?  ? ?CSN: [id number] ? ?Arrival date and time: 05/02/21 1945 ? ? Event Date/Time  ? First Provider Initiated Contact with Patient 05/02/21 2112   ?  ? ?Chief Complaint  ?Patient presents with  ? Decreased Fetal Movement  ? ?Allison Friedman is a 26 y.o. G2P0010 at [redacted]w[redacted]d who receives care at [facility].  She presents today for Decreased Fetal Movement.  She reports she woke around 1pm and "ate thinks I know she would like and that didn't work."  Patient reports she ate some breakfast sausages with syrup, but had nothing else.  She reports having about 2 cups of water and some lemonade, but no other drinks. She reports taking a shower with no improvement in movement, although that stimulated movement yesterday.  She does endorse movement since arrival and denies abdominal cramping or contractions.  She also denies issues with urination or diarrhea, while reporting constipation that has been present throughout the pregnancy.  Patient reports some pelvic pressure, but denies vaginal concerns including abnormal discharge, bleeding, or leaking.  ? ? ?OB History   ? ? Gravida  ?2  ? Para  ?   ? Term  ?   ? Preterm  ?   ? AB  ?1  ? Living  ?0  ?  ? ? SAB  ?1  ? IAB  ?   ? Ectopic  ?   ? Multiple  ?   ? Live Births  ?   ?   ?  ?  ? ? ?Past Medical History:  ?Diagnosis Date  ? Anemia   ? Diabetes mellitus without complication (HCC)   ? Gestational diabetes   ? ? ?Past Surgical History:  ?Procedure Laterality Date  ? NECK SURGERY    ? ? ?Family History  ?Problem Relation Age of Onset  ? Hypertension Mother   ? ? ?Social History  ? ?Tobacco Use  ? Smoking status: Never  ? Smokeless tobacco: Never  ?Vaping Use  ? Vaping Use: Never used  ?Substance Use Topics  ? Alcohol use: Never  ? Drug use: Never  ? ? ?Allergies: No Known Allergies ? ?Medications Prior to Admission  ?Medication Sig Dispense Refill Last Dose  ? ferrous sulfate 325 (65 FE) MG tablet Take 325 mg by mouth daily with breakfast.   05/01/2021  ? insulin  glargine (LANTUS) 100 UNIT/ML injection Inject 56 Units into the skin at bedtime.   05/01/2021  ? metFORMIN (GLUCOPHAGE) 500 MG tablet Take by mouth daily after supper.   05/01/2021  ? Pediatric Multiple Vitamins (FLINSTONES GUMMIES OMEGA-3 DHA PO) Take by mouth.   05/01/2021  ? ? ?Review of Systems  ?Gastrointestinal:  Positive for constipation. Negative for abdominal pain, diarrhea, nausea and vomiting.  ?Genitourinary:  Positive for pelvic pain (Pressure 5/10). Negative for difficulty urinating, dysuria, vaginal bleeding and vaginal discharge.  ?Neurological:  Negative for headaches.  ?Physical Exam  ? ?Blood pressure 122/63, pulse 70, temperature 98.7 ?F (37.1 ?C), temperature source Oral, resp. rate 20, height 5\' 4"  (1.626 m), weight 133.7 kg, SpO2 99 %. ? ?Physical Exam ?Constitutional:   ?   General: She is not in acute distress. ?   Appearance: Normal appearance. She is obese.  ?HENT:  ?   Head: Normocephalic and atraumatic.  ?Eyes:  ?   Conjunctiva/sclera: Conjunctivae normal.  ?Cardiovascular:  ?   Rate and Rhythm: Normal rate.  ?Abdominal:  ?   Comments: Gravid  ?Musculoskeletal:     ?  General: Normal range of motion.  ?   Cervical back: Normal range of motion.  ?Neurological:  ?   Mental Status: She is alert and oriented to person, place, and time.  ?Psychiatric:     ?   Mood and Affect: Mood normal.     ?   Behavior: Behavior normal.     ?   Thought Content: Thought content normal.  ? ? ?Fetal Assessment ?145 bpm, Mod Var, -Decels, +Accels ?Toco: No Ctx Graphed ? ?MAU Course  ? ?Results for orders placed or performed during the hospital encounter of 05/02/21 (from the past 24 hour(s))  ?Urinalysis, Routine w reflex microscopic Urine, Clean Catch     Status: Abnormal  ? Collection Time: 05/02/21  8:18 PM  ?Result Value Ref Range  ? Color, Urine YELLOW YELLOW  ? APPearance CLOUDY (A) CLEAR  ? Specific Gravity, Urine 1.020 1.005 - 1.030  ? pH 8.0 5.0 - 8.0  ? Glucose, UA NEGATIVE NEGATIVE mg/dL  ? Hgb urine  dipstick NEGATIVE NEGATIVE  ? Bilirubin Urine NEGATIVE NEGATIVE  ? Ketones, ur NEGATIVE NEGATIVE mg/dL  ? Protein, ur NEGATIVE NEGATIVE mg/dL  ? Nitrite NEGATIVE NEGATIVE  ? Leukocytes,Ua NEGATIVE NEGATIVE  ? ?No results found. ? ?MDM ?PE ?Labs: UA ?EFM ?Assessment and Plan  ?26 year old G2P0010  ?SIUP at 36.4 weeks ?Cat I FT ?DFM ? ?-Movement present and reassurances given. ?-Will monitor and if continues to experience appropriate fetal movement, will discharge. ?-Will give liter of water for oral consumption.  ? ? ?Cherre Robins MSN, CNM ?05/02/2021, 9:12 PM  ? ?Reassessment (9:55 PM) ? ?-Reports continued movement. ?-NST reactive ?-Patient reports she is scheduled for IOL next week. ?-Further reports appt tomorrow. ?-Instructed to keep all appts as scheduled. ?-Encouraged to call primary office or return to MAU if symptoms worsen or with the onset of new symptoms. ?-Discharged to home in stable condition. ? ?Cherre Robins MSN, CNM ?Systems developer, [facility] [employer] ? ? ?

## 2021-05-02 NOTE — Progress Notes (Signed)
Total number of clicks: 31 ?

## 2021-05-03 DIAGNOSIS — Z3A36 36 weeks gestation of pregnancy: Secondary | ICD-10-CM | POA: Diagnosis not present

## 2021-05-03 DIAGNOSIS — O24113 Pre-existing diabetes mellitus, type 2, in pregnancy, third trimester: Secondary | ICD-10-CM | POA: Diagnosis not present

## 2021-05-06 ENCOUNTER — Other Ambulatory Visit: Payer: Self-pay | Admitting: Obstetrics

## 2021-05-06 ENCOUNTER — Encounter (HOSPITAL_COMMUNITY): Payer: Self-pay | Admitting: Obstetrics

## 2021-05-06 ENCOUNTER — Other Ambulatory Visit: Payer: Self-pay

## 2021-05-06 NOTE — H&P (Incomplete)
Allison Friedman is a 26 y.o. G2P0010 at [redacted]w[redacted]d presenting for IOL due to poorly controlled T2DM in pregnancy. Pt notes occarional contractions and overall pelvic pains/ discomfort . Not always feeling adequate fetal movement, No vaginal bleeding, not leaking fluid.  [facility] at [facility] since 7 wks - Dated by 7 wk u/s, unsure LMP - Pre-existing T2DM which she had been 'managing on her her own'. Starting A1C 9.2. Started on metformin 500 bid and quickly moved to Lantus, up to 68 units qhs by the end of the pregnancy. Nl fetal echo. Morbid obesity, 27# wt gain. Baby ASA, 3rd trimester testing with reactive fetal status. A1C dropped to 6.1 by the middle of the pregnancy but then despite increasing Lantus, A1C increased to 7.1 1 month ago and FBS in low 100s-120s and PP up to 200.s MFM consult/ curbside with Dr. Grace Bushy recc delivery at 37 wks - Anxiety/ depression. On Lexapro - Baseline elevation in AST/ALT into 40's, normalized into teens through pregnancy - GERD - GBS positive.    Prenatal Transfer Tool  Maternal Diabetes: Yes:  Diabetes Type:  Pre-pregnancy Genetic Screening: Normal Maternal Ultrasounds/Referrals: Normal Fetal Ultrasounds or other Referrals:  Fetal echo Maternal Substance Abuse:  No Significant Maternal Medications:  Meds include: Other:  Significant Maternal Lab Results: Group B Strep positive     OB History     Gravida  2   Para      Term      Preterm      AB  1   Living  0      SAB  1   IAB      Ectopic      Multiple      Live Births             Past Medical History:  Diagnosis Date   Anemia    Anxiety    Depression    Diabetes mellitus without complication (HCC)    Past Surgical History:  Procedure Laterality Date   NECK SURGERY     Family History: family history includes Hypertension in her mother. Social History:  reports that she has never smoked. She has never used smokeless tobacco. She reports that she does not drink alcohol  and does not use drugs.  Review of Systems - Negative except discomfort of pregnancy, anxiety     There were no vitals taken for this visit.  Physical Exam: *** Gen: well appearing, no distress CV: RRR Pulm: CTAB Back: no CVAT Abd: gravid, NT, no RUQ pain LE: 1+ edema, equal bilaterally, non-tender Toco: *** FH: baseline ***, accelerations present, no deceleratons, 10 beat variability  Prenatal labs: ABO, Rh: --/--/O POS (10/29 3664) Antibody: NEG (10/29 4034) Rubella: Immune (07/27 0000) RPR: Nonreactive (07/27 0000)  HBsAg: Negative (07/27 0000)  HIV: Non-reactive (07/27 0000)  GBS: Positive/-- (02/27 0000)  1 hr Glucola not done, known T2Dm  Genetic screening nl [employer] NIPS, nl AFP Anatomy US normal   Assessment/Plan: 26 y.o. G2P0010 at [redacted]w[redacted]d - Poorly controlled T2DM, recc IOL - IOL, cytotec x 2 o/n, pit/ foley in am.  - GBS pos, start PCN with pitocin - T2DM. Check BS q 4 hrs, may need to increase in active labor if abnl BS. Pt instructed to take 40 units Lantus (normally 68) the evening prior to MN IOL.  - SW consult PP, depression/ anxiety   Lendon Colonel 05/06/2021, 9:33 PM

## 2021-05-07 ENCOUNTER — Other Ambulatory Visit: Payer: Self-pay

## 2021-05-07 ENCOUNTER — Inpatient Hospital Stay (HOSPITAL_COMMUNITY)
Admission: AD | Admit: 2021-05-07 | Discharge: 2021-05-11 | DRG: 788 | Disposition: A | Discharge status: Home / Self Care | Payer: BC Managed Care – PPO | Attending: Obstetrics and Gynecology | Admitting: Obstetrics and Gynecology

## 2021-05-07 ENCOUNTER — Encounter (HOSPITAL_COMMUNITY): Payer: Self-pay | Admitting: Obstetrics

## 2021-05-07 ENCOUNTER — Inpatient Hospital Stay (HOSPITAL_COMMUNITY): Payer: BC Managed Care – PPO

## 2021-05-07 DIAGNOSIS — O99344 Other mental disorders complicating childbirth: Secondary | ICD-10-CM | POA: Diagnosis present

## 2021-05-07 DIAGNOSIS — O9902 Anemia complicating childbirth: Secondary | ICD-10-CM | POA: Diagnosis not present

## 2021-05-07 DIAGNOSIS — Z7984 Long term (current) use of oral hypoglycemic drugs: Secondary | ICD-10-CM | POA: Diagnosis not present

## 2021-05-07 DIAGNOSIS — Z3A37 37 weeks gestation of pregnancy: Secondary | ICD-10-CM

## 2021-05-07 DIAGNOSIS — Z20822 Contact with and (suspected) exposure to covid-19: Secondary | ICD-10-CM | POA: Diagnosis not present

## 2021-05-07 DIAGNOSIS — Z3490 Encounter for supervision of normal pregnancy, unspecified, unspecified trimester: Secondary | ICD-10-CM | POA: Diagnosis present

## 2021-05-07 DIAGNOSIS — O24429 Gestational diabetes mellitus in childbirth, unspecified control: Secondary | ICD-10-CM | POA: Diagnosis not present

## 2021-05-07 DIAGNOSIS — B951 Streptococcus, group B, as the cause of diseases classified elsewhere: Secondary | ICD-10-CM | POA: Diagnosis present

## 2021-05-07 DIAGNOSIS — E119 Type 2 diabetes mellitus without complications: Secondary | ICD-10-CM

## 2021-05-07 DIAGNOSIS — Z794 Long term (current) use of insulin: Secondary | ICD-10-CM

## 2021-05-07 DIAGNOSIS — O99214 Obesity complicating childbirth: Secondary | ICD-10-CM | POA: Diagnosis not present

## 2021-05-07 DIAGNOSIS — O99824 Streptococcus B carrier state complicating childbirth: Secondary | ICD-10-CM | POA: Diagnosis present

## 2021-05-07 DIAGNOSIS — F419 Anxiety disorder, unspecified: Secondary | ICD-10-CM | POA: Diagnosis not present

## 2021-05-07 DIAGNOSIS — F32A Depression, unspecified: Secondary | ICD-10-CM | POA: Diagnosis present

## 2021-05-07 DIAGNOSIS — E1165 Type 2 diabetes mellitus with hyperglycemia: Secondary | ICD-10-CM | POA: Diagnosis present

## 2021-05-07 DIAGNOSIS — O2412 Pre-existing diabetes mellitus, type 2, in childbirth: Secondary | ICD-10-CM | POA: Diagnosis not present

## 2021-05-07 DIAGNOSIS — Z98891 History of uterine scar from previous surgery: Secondary | ICD-10-CM

## 2021-05-07 HISTORY — DX: Anxiety disorder, unspecified: F41.9

## 2021-05-07 HISTORY — DX: Depression, unspecified: F32.A

## 2021-05-07 LAB — TYPE AND SCREEN
ABO/RH(D): O POS
Antibody Screen: NEGATIVE

## 2021-05-07 LAB — GLUCOSE, RANDOM: Glucose, Bld: 219 mg/dL — ABNORMAL HIGH (ref 70–99)

## 2021-05-07 LAB — CBC
HCT: 38.8 % (ref 36.0–46.0)
Hemoglobin: 12.6 g/dL (ref 12.0–15.0)
MCH: 28 pg (ref 26.0–34.0)
MCHC: 32.5 g/dL (ref 30.0–36.0)
MCV: 86.2 fL (ref 80.0–100.0)
Platelets: 197 10*3/uL (ref 150–400)
RBC: 4.5 MIL/uL (ref 3.87–5.11)
RDW: 14.4 % (ref 11.5–15.5)
WBC: 7.7 10*3/uL (ref 4.0–10.5)
nRBC: 0 % (ref 0.0–0.2)

## 2021-05-07 LAB — RESP PANEL BY RT-PCR (FLU A&B, COVID) ARPGX2
Influenza A by PCR: NEGATIVE
Influenza B by PCR: NEGATIVE
SARS Coronavirus 2 by RT PCR: NEGATIVE

## 2021-05-07 LAB — RPR: RPR Ser Ql: NONREACTIVE

## 2021-05-07 LAB — GLUCOSE, CAPILLARY
Glucose-Capillary: 175 mg/dL — ABNORMAL HIGH (ref 70–99)
Glucose-Capillary: 164 mg/dL — ABNORMAL HIGH (ref 70–99)
Glucose-Capillary: 111 mg/dL — ABNORMAL HIGH (ref 70–99)
Glucose-Capillary: 99 mg/dL (ref 70–99)
Glucose-Capillary: 118 mg/dL — ABNORMAL HIGH (ref 70–99)
Glucose-Capillary: 108 mg/dL — ABNORMAL HIGH (ref 70–99)
Glucose-Capillary: 68 mg/dL — ABNORMAL LOW (ref 70–99)
Glucose-Capillary: 86 mg/dL (ref 70–99)

## 2021-05-07 MED ORDER — DIPHENHYDRAMINE HCL 50 MG/ML IJ SOLN: 12.5000 mg | INTRAMUSCULAR | Status: DC | PRN

## 2021-05-07 MED ORDER — MISOPROSTOL 25 MCG QUARTER TABLET
25.0000 ug | ORAL_TABLET | ORAL | Status: AC | PRN
  Administered 2021-05-07 (×2): 25 ug via VAGINAL
  Filled 2021-05-07 (×2): qty 1

## 2021-05-07 MED ORDER — ACETAMINOPHEN 325 MG PO TABS
650.0000 mg | ORAL_TABLET | ORAL | Status: DC | PRN
  Administered 2021-05-07: 650 mg via ORAL
  Filled 2021-05-07: qty 2

## 2021-05-07 MED ORDER — FENTANYL CITRATE (PF) 100 MCG/2ML IJ SOLN
50.0000 ug | INTRAMUSCULAR | Status: DC | PRN
  Administered 2021-05-07 – 2021-05-08 (×3): 50 ug via INTRAVENOUS
  Filled 2021-05-07 (×3): qty 2

## 2021-05-07 MED ORDER — TERBUTALINE SULFATE 1 MG/ML IJ SOLN: 0.2500 mg | Freq: Once | INTRAMUSCULAR | Status: DC | PRN

## 2021-05-07 MED ORDER — PENICILLIN G POT IN DEXTROSE 60000 UNIT/ML IV SOLN
3.0000 10*6.[IU] | INTRAVENOUS | Status: DC
  Administered 2021-05-07 – 2021-05-08 (×10): 3 10*6.[IU] via INTRAVENOUS
  Filled 2021-05-07 (×10): qty 50

## 2021-05-07 MED ORDER — INSULIN ASPART 100 UNIT/ML IJ SOLN
0.0000 [IU] | INTRAMUSCULAR | Status: DC
  Administered 2021-05-07: 1 [IU] via SUBCUTANEOUS
  Administered 2021-05-07: 3 [IU] via SUBCUTANEOUS
  Administered 2021-05-08: 2 [IU] via SUBCUTANEOUS
  Administered 2021-05-08: 3 [IU] via SUBCUTANEOUS

## 2021-05-07 MED ORDER — PENICILLIN G POTASSIUM 5000000 UNITS IJ SOLR
5.0000 10*6.[IU] | Freq: Once | INTRAMUSCULAR | Status: AC
  Administered 2021-05-07: 5 10*6.[IU] via INTRAVENOUS
  Filled 2021-05-07: qty 5

## 2021-05-07 MED ORDER — PHENYLEPHRINE 40 MCG/ML (10ML) SYRINGE FOR IV PUSH (FOR BLOOD PRESSURE SUPPORT): 80.0000 ug | PREFILLED_SYRINGE | INTRAVENOUS | Status: DC | PRN

## 2021-05-07 MED ORDER — LIDOCAINE HCL (PF) 1 % IJ SOLN: 30.0000 mL | INTRAMUSCULAR | Status: DC | PRN

## 2021-05-07 MED ORDER — OXYTOCIN-SODIUM CHLORIDE 30-0.9 UT/500ML-% IV SOLN
1.0000 m[IU]/min | INTRAVENOUS | Status: DC
  Administered 2021-05-07: 2 m[IU]/min via INTRAVENOUS
  Filled 2021-05-07: qty 500

## 2021-05-07 MED ORDER — OXYTOCIN BOLUS FROM INFUSION: 333.0000 mL | Freq: Once | INTRAVENOUS | Status: DC

## 2021-05-07 MED ORDER — LACTATED RINGERS IV SOLN
INTRAVENOUS | Status: DC
Start: 2021-05-07 — End: 2021-05-09

## 2021-05-07 MED ORDER — ONDANSETRON HCL 4 MG/2ML IJ SOLN
4.0000 mg | Freq: Four times a day (QID) | INTRAMUSCULAR | Status: DC | PRN
Start: 2021-05-07 — End: 2021-05-09
  Administered 2021-05-07: 4 mg via INTRAVENOUS
  Filled 2021-05-07: qty 2

## 2021-05-07 MED ORDER — EPHEDRINE 5 MG/ML INJ: 10.0000 mg | INTRAVENOUS | Status: DC | PRN

## 2021-05-07 MED ORDER — LACTATED RINGERS IV SOLN: 500.0000 mL | Freq: Once | INTRAVENOUS | Status: DC

## 2021-05-07 MED ORDER — OXYTOCIN-SODIUM CHLORIDE 30-0.9 UT/500ML-% IV SOLN
2.5000 [IU]/h | INTRAVENOUS | Status: DC
Start: 2021-05-07 — End: 2021-05-09

## 2021-05-07 MED ORDER — FENTANYL-BUPIVACAINE-NACL 0.5-0.125-0.9 MG/250ML-% EP SOLN: 12.0000 mL/h | EPIDURAL | Status: DC | PRN

## 2021-05-07 MED ORDER — SOD CITRATE-CITRIC ACID 500-334 MG/5ML PO SOLN
30.0000 mL | ORAL | Status: DC | PRN
  Administered 2021-05-08: 30 mL via ORAL
  Filled 2021-05-07: qty 30

## 2021-05-07 MED ORDER — LACTATED RINGERS IV SOLN
500.0000 mL | INTRAVENOUS | Status: DC | PRN
  Administered 2021-05-07: 500 mL via INTRAVENOUS

## 2021-05-07 NOTE — Progress Notes (Signed)
S: ?Feeling some cramping. Husband, Allison Friedman, present and supportive. Discussed the R/B/A of Foley balloon placement for cervical ripening and patient consents to procedure. Eagerly anticipating baby girl, "Allison Friedman".  ? ?O: ?Vitals:  ? 05/07/21 0222 05/07/21 0455 05/07/21 0723 05/07/21 0800  ?BP: 131/66 124/76 132/76   ?Pulse: 81 70 81   ?Resp:  18 17 18   ?Temp:  98.3 ?F (36.8 ?C) 98.5 ?F (36.9 ?C)   ?TempSrc:  Oral Axillary   ? ?FHT:  FHR: 155 bpm, variability: moderate,  accelerations:  Present,  decelerations:  Absent ?UC:   irregular, every 3-7 minutes ?SVE:   Dilation: 1.5 ?Effacement (%): 50 ?Station: -3 ?Exam by: A. CNM ? ?Foley balloon placed without difficulty. 60cc of fluid instilled in balloon and taped to leg for traction. Patient tolerated procedure well.  ? ?A / P: ?Induction of labor due to maternal medical conditions, s/p 2 doses of Cytotec overnight, Foley balloon now placed ? ?Fetal Wellbeing:  Category I ?Pain Control:  Labor support without medications ?Anticipated MOD:   Working towards NSVB ? ?Dr. Yetta Barre updated on patient status and plan of care.  ? ?Allison Penna, MSN ?05/07/2021, 8:58 AM ? ?   ?

## 2021-05-07 NOTE — Progress Notes (Signed)
Inpatient Diabetes Program Recommendations ? ?ADA Standards of Care 2021 ?Diabetes in Pregnancy Target Glucose Ranges: ? ?Fasting: 60 - 90 mg/dL ?Preprandial: 60 - 105 mg/dL ?1 hr postprandial: Less than 140mg /dL (from first bite of meal) ?2 hr postprandial: Less than 120 mg/dL (from first bit of meal) ?  ? ?Lab Results  ?Component Value Date  ? GLUCAP 99 05/07/2021  ? ? ?Review of Glycemic Control ? ?Diabetes history: DM ?Outpatient Diabetes medications: Lantus 56 units QD, Metformin 500 mg with supper ?Current orders for Inpatient glycemic control: Novolog 0-14 units Q4H ? ? ?Referral received for DM evaluation.  Agree with current regimen. Noted correction was held this morning for CBG of 99 mg/dL.  Should have received 1 unit.   ? ?Will continue to follow while inpatient. ? ?Thank you, ?Reche Dixon, MSN, RN ?Diabetes Coordinator ?Inpatient Diabetes Program ?[phone] (team pager from 8a-5p) ? ? ? ? ? ?

## 2021-05-07 NOTE — Progress Notes (Signed)
S: ?Feeling much better after Foley balloon was expelled. ? ?O: ?Vitals:  ? 05/07/21 0222 05/07/21 0455 05/07/21 0723 05/07/21 0800  ?BP: 131/66 124/76 132/76   ?Pulse: 81 70 81   ?Resp:  18 17 18   ?Temp:  98.3 ?F (36.8 ?C) 98.5 ?F (36.9 ?C)   ?TempSrc:  Oral Axillary   ? ?FHT:  FHR: 155 bpm, variability: moderate,  accelerations:  Present,  decelerations:  Absent ?UC:   irregular, every 3-7 minutes ?SVE:   Dilation: 3 ?Effacement (%): 50 ?Station: -3 ?Exam by: A. CNM ? ?A / P: ?Induction of labor due to maternal medical conditions, s/p 2 doses of Cytotec and Foley balloon now expelled ? ?Fetal Wellbeing:  Category I ?Pain Control:  IV pain meds ?Anticipated MOD:   Working towards NSVB ? ?Will start Pitocin 2x2. Consider AROM in active labor. Epidural upon request.  ? ?Yetta Barre, MSN ?05/07/2021, 9:10 AM ? ?   ?

## 2021-05-07 NOTE — Progress Notes (Signed)
S: ?Comfortable. Mild cramping. Discussed the R/B/A of AROM for labor induction and patient consents to procedure.  ? ?O: ?Vitals:  ? 05/07/21 1801 05/07/21 1831 05/07/21 1839 05/07/21 1845  ?BP: (!) 138/92 (!) 153/87 (!) 136/97   ?Pulse: 72 75 74   ?Resp: 17 17    ?Temp:    98.4 ?F (36.9 ?C)  ?TempSrc:    Oral  ? ?FHT:  FHR: 135 bpm, variability: moderate,  accelerations:  Present,  decelerations:  Absent ?UC:   irregular, every 1-4 minutes ?SVE:   Dilation: 3 ?Effacement (%): 50 ?Station: -3 ?Exam by:: A Jaiveer Panas CNM ? ?AROM of a small amount of clear fluid at 1832.  ? ?A / P: ?Induction of labor due to maternal medical conditions, s/p 2 doses of Cytotec and Foley balloon, Pitocin infusing at 89mu ? ?Fetal Wellbeing:  Category I ?I/D: GBS positive, adequately treated ?Pain Control:  IV pain meds ?Anticipated MOD:   Working towards NSVB ? ?AROM now of clear fluid. May need Pitocin break if no active labor. Epidural on request. ? ?Dr. Ernestina Penna updated on patient status and plan of care.  ? ?Clancy Gourd, MSN ?05/07/2021, 7:02 PM ? ?   ?

## 2021-05-07 NOTE — Progress Notes (Signed)
S: ?Doing well, no complaints, pain adequately  controlled with position changes.  ? ?O: ?BP 105/60   Pulse (!) 59   Temp 98.6 ?F (37 ?C) (Oral)   Resp 18  ?Vitals:  ? 05/07/21 1930 05/07/21 2000 05/07/21 2030 05/07/21 2100  ?BP: (!) 109/58 (!) 108/58 (!) 102/59 105/60  ?Pulse: 69 73 (!) 57 (!) 59  ?Resp:      ?Temp:      ?TempSrc:      ? ? ? ? ?FHT:  FHR: 140s bpm, variability: moderate,  accelerations:  Present,  decelerations:  Absent difficult to get a continuous tracing.  ?UC:   not tracing well ?SVE:   Dilation: 3 ?Effacement (%): 50 ?Station: -3 ?Exam by:: A Jones CNM ? ?Repeat exam by me: 2/80%/vtx -3 ? ?A / P:  26 y.o.  ?OB History  ?Gravida Para Term Preterm AB Living  ?2 0 0 0 1 0  ?SAB IAB Ectopic Multiple Live Births  ?1 0 0 0 0  ? at [redacted]w[redacted]d ?IOL poorly controlled T2DM, Slow progress on IOL, s/p cervical foley (expulsed in a few hrs), cytotec x 2 and pitocin >12 hrs, up to max of 30 munits/ min, not yet in labor. Will give 4 hr pitocin break, allow diabetic diet and will follow with SSI. Overall stable  BS.  ? ?Fetal Wellbeing:  Category I ?Pain Control:  Labor support without medications ? ?Anticipated MOD:   unclear, AGA, need to get pt into active labor ? ?Allison Friedman ?05/07/2021, 10:02 PM ? ?

## 2021-05-07 NOTE — Progress Notes (Signed)
S: ?Uncomfortable with ctx ? ?O: ?BP (!) 136/97   Pulse 74   Temp 98.4 ?F (36.9 ?C) (Oral)   Resp 17  ?BS reviewed ?Bps reviewed ? ? ?FHT:  FHR: 140s bpm, variability: moderate,  accelerations:  Present,  decelerations:  Absent ?UC:   not tracing well, pit at 30 munits/ min ?SVE:   Dilation: 3 ?Effacement (%): 50 ?Station: -3 ?Exam by:: A Jones CNM ?AROM by A Jones ? ?A / P:  26 y.o.  ?OB History  ?Gravida Para Term Preterm AB Living  ?2 0 0 0 1 0  ?SAB IAB Ectopic Multiple Live Births  ?1 0 0 0 0  ? at [redacted]w[redacted]d ?IOL, poorly controlled GDM ?Cont pitocin, may need IUPC, may need pit breat ?Cont SSI ?Watch bps ? ?Fetal Wellbeing:  Category I ?Pain Control:  Labor support without medications ? ?Anticipated MOD:   working towards vaginal delivery ? ?Ala Dach ?05/07/2021, 6:57 PM ? ?

## 2021-05-08 ENCOUNTER — Inpatient Hospital Stay (HOSPITAL_COMMUNITY): Payer: BC Managed Care – PPO | Admitting: Anesthesiology

## 2021-05-08 ENCOUNTER — Encounter (HOSPITAL_COMMUNITY)
Admission: AD | Disposition: A | Discharge status: Home / Self Care | Payer: Self-pay | Source: Home / Self Care | Attending: Obstetrics and Gynecology

## 2021-05-08 LAB — CBC
HCT: 35 % — ABNORMAL LOW (ref 36.0–46.0)
Hemoglobin: 11.6 g/dL — ABNORMAL LOW (ref 12.0–15.0)
MCH: 28.1 pg (ref 26.0–34.0)
MCHC: 33.1 g/dL (ref 30.0–36.0)
MCV: 84.7 fL (ref 80.0–100.0)
Platelets: 160 10*3/uL (ref 150–400)
RBC: 4.13 MIL/uL (ref 3.87–5.11)
RDW: 14.3 % (ref 11.5–15.5)
WBC: 13.2 10*3/uL — ABNORMAL HIGH (ref 4.0–10.5)
nRBC: 0 % (ref 0.0–0.2)

## 2021-05-08 LAB — GLUCOSE, CAPILLARY
Glucose-Capillary: 130 mg/dL — ABNORMAL HIGH (ref 70–99)
Glucose-Capillary: 167 mg/dL — ABNORMAL HIGH (ref 70–99)
Glucose-Capillary: 75 mg/dL (ref 70–99)
Glucose-Capillary: 82 mg/dL (ref 70–99)
Glucose-Capillary: 89 mg/dL (ref 70–99)
Glucose-Capillary: 90 mg/dL (ref 70–99)

## 2021-05-08 SURGERY — Surgical Case
Anesthesia: Spinal

## 2021-05-08 MED ORDER — OXYTOCIN-SODIUM CHLORIDE 30-0.9 UT/500ML-% IV SOLN
INTRAVENOUS | Status: DC | PRN
  Administered 2021-05-08: 30 [IU] via INTRAVENOUS

## 2021-05-08 MED ORDER — FENTANYL CITRATE (PF) 100 MCG/2ML IJ SOLN
INTRAMUSCULAR | Status: DC | PRN
  Administered 2021-05-08: 15 ug via INTRAVENOUS
  Administered 2021-05-08: 50 ug via INTRAVENOUS

## 2021-05-08 MED ORDER — SODIUM CHLORIDE 0.9 % IV SOLN
INTRAVENOUS | Status: AC
Start: 2021-05-08 — End: ?
  Filled 2021-05-08: qty 5

## 2021-05-08 MED ORDER — CEFAZOLIN IN SODIUM CHLORIDE 3-0.9 GM/100ML-% IV SOLN
INTRAVENOUS | Status: AC
  Filled 2021-05-08: qty 100

## 2021-05-08 MED ORDER — SODIUM CHLORIDE 0.9 % IV SOLN
500.0000 mg | Freq: Once | INTRAVENOUS | Status: AC
  Administered 2021-05-08: 500 mg via INTRAVENOUS

## 2021-05-08 MED ORDER — PHENYLEPHRINE HCL-NACL 20-0.9 MG/250ML-% IV SOLN
INTRAVENOUS | Status: DC | PRN
  Administered 2021-05-08: 60 ug/min via INTRAVENOUS

## 2021-05-08 MED ORDER — PHENYLEPHRINE HCL-NACL 20-0.9 MG/250ML-% IV SOLN
INTRAVENOUS | Status: AC
  Filled 2021-05-08: qty 250

## 2021-05-08 MED ORDER — CEFAZOLIN IN SODIUM CHLORIDE 3-0.9 GM/100ML-% IV SOLN
3.0000 g | Freq: Once | INTRAVENOUS | Status: AC
  Administered 2021-05-08: 3 g via INTRAVENOUS
  Filled 2021-05-08 (×2): qty 100

## 2021-05-08 MED ORDER — SODIUM CHLORIDE 0.9 % IV SOLN: INTRAVENOUS | Status: DC | PRN

## 2021-05-08 MED ORDER — MORPHINE SULFATE (PF) 0.5 MG/ML IJ SOLN
INTRAMUSCULAR | Status: AC
  Filled 2021-05-08: qty 10

## 2021-05-08 MED ORDER — FENTANYL CITRATE (PF) 100 MCG/2ML IJ SOLN
100.0000 ug | INTRAMUSCULAR | Status: DC | PRN
  Administered 2021-05-08 (×2): 100 ug via INTRAVENOUS
  Filled 2021-05-08 (×2): qty 2

## 2021-05-08 MED ORDER — ONDANSETRON HCL 4 MG/2ML IJ SOLN
INTRAMUSCULAR | Status: AC
  Filled 2021-05-08: qty 2

## 2021-05-08 MED ORDER — FENTANYL CITRATE (PF) 100 MCG/2ML IJ SOLN
INTRAMUSCULAR | Status: AC
  Filled 2021-05-08: qty 2

## 2021-05-08 MED ORDER — MISOPROSTOL 50MCG HALF TABLET
50.0000 ug | ORAL_TABLET | ORAL | Status: DC
  Administered 2021-05-08: 50 ug via BUCCAL
  Filled 2021-05-08: qty 1

## 2021-05-08 MED ORDER — OXYTOCIN-SODIUM CHLORIDE 30-0.9 UT/500ML-% IV SOLN
INTRAVENOUS | Status: AC
  Filled 2021-05-08: qty 500

## 2021-05-08 MED ORDER — OXYTOCIN-SODIUM CHLORIDE 30-0.9 UT/500ML-% IV SOLN
1.0000 m[IU]/min | INTRAVENOUS | Status: DC
  Administered 2021-05-08: 4 m[IU]/min via INTRAVENOUS
  Filled 2021-05-08: qty 500

## 2021-05-08 MED ORDER — LACTATED RINGERS IV SOLN: INTRAVENOUS | Status: DC | PRN

## 2021-05-08 MED ORDER — TERBUTALINE SULFATE 1 MG/ML IJ SOLN: 0.2500 mg | Freq: Once | INTRAMUSCULAR | Status: DC | PRN

## 2021-05-08 SURGICAL SUPPLY — 35 items
BENZOIN TINCTURE PRP APPL 2/3 (GAUZE/BANDAGES/DRESSINGS) IMPLANT
CHLORAPREP W/TINT 26ML (MISCELLANEOUS) ×2 IMPLANT
CLAMP CORD UMBIL (MISCELLANEOUS) IMPLANT
CLOTH BEACON ORANGE TIMEOUT ST (SAFETY) ×2 IMPLANT
DRESSING PREVENA PLUS CUSTOM (GAUZE/BANDAGES/DRESSINGS) IMPLANT
DRSG OPSITE POSTOP 4X10 (GAUZE/BANDAGES/DRESSINGS) ×2 IMPLANT
DRSG PREVENA PLUS CUSTOM (GAUZE/BANDAGES/DRESSINGS) ×2
ELECT REM PT RETURN 9FT ADLT (ELECTROSURGICAL) ×2
ELECTRODE REM PT RTRN 9FT ADLT (ELECTROSURGICAL) ×1 IMPLANT
EXTRACTOR VACUUM KIWI (MISCELLANEOUS) IMPLANT
GLOVE BIOGEL PI IND STRL 7.0 (GLOVE) ×3 IMPLANT
GLOVE BIOGEL PI INDICATOR 7.0 (GLOVE) ×3
GLOVE ECLIPSE 6.5 STRL STRAW (GLOVE) ×2 IMPLANT
GOWN STRL REUS W/TWL LRG LVL3 (GOWN DISPOSABLE) ×4 IMPLANT
KIT ABG SYR 3ML LUER SLIP (SYRINGE) IMPLANT
LIGASURE IMPACT 36 18CM CVD LR (INSTRUMENTS) ×2 IMPLANT
NDL HYPO 25X5/8 SAFETYGLIDE (NEEDLE) IMPLANT
NEEDLE HYPO 25X5/8 SAFETYGLIDE (NEEDLE) IMPLANT
NS IRRIG 1000ML POUR BTL (IV SOLUTION) ×2 IMPLANT
PACK C SECTION WH (CUSTOM PROCEDURE TRAY) ×2 IMPLANT
PAD OB MATERNITY 4.3X12.25 (PERSONAL CARE ITEMS) ×2 IMPLANT
RETAINER VISCERAL (MISCELLANEOUS) ×1 IMPLANT
STRIP CLOSURE SKIN 1/2X4 (GAUZE/BANDAGES/DRESSINGS) IMPLANT
SUT MNCRL 0 VIOLET CTX 36 (SUTURE) ×2 IMPLANT
SUT MONOCRYL 0 CTX 36 (SUTURE) ×3
SUT PLAIN 0 NONE (SUTURE) IMPLANT
SUT PLAIN 2 0 (SUTURE) ×1
SUT PLAIN 2 0 XLH (SUTURE) ×1 IMPLANT
SUT PLAIN ABS 2-0 CT1 27XMFL (SUTURE) ×1 IMPLANT
SUT VIC AB 0 CT1 27 (SUTURE) ×2
SUT VIC AB 0 CT1 27XBRD ANBCTR (SUTURE) ×1 IMPLANT
SUT VIC AB 4-0 KS 27 (SUTURE) ×2 IMPLANT
TOWEL OR 17X24 6PK STRL BLUE (TOWEL DISPOSABLE) ×2 IMPLANT
TRAY FOLEY W/BAG SLVR 14FR LF (SET/KITS/TRAYS/PACK) IMPLANT
WATER STERILE IRR 1000ML POUR (IV SOLUTION) ×3 IMPLANT

## 2021-05-08 NOTE — Progress Notes (Signed)
S: ?Doing well, Pt notes back pain, trying to rest during 4 hr "pit break.". Needed IV replaced by IV team. Planning epidural ? ?O: ?BP 108/61   Pulse 80   Temp 99 ?F (37.2 ?C) (Oral)   Resp 17  ? ? ?FHT:  FHR: 140s bpm, variability: moderate,  accelerations:  Present,  decelerations:  Absent ?UC:   rare, IUPC just placed, pit restarting ?SVE:   Dilation: 3 ?Effacement (%): 50 ?Station: -3 ?Exam by:: Ernestina Penna, MD ? ? ?A / P:  26 y.o.  ?OB History  ?Gravida Para Term Preterm AB Living  ?2 0 0 0 1 0  ?SAB IAB Ectopic Multiple Live Births  ?1 0 0 0 0  ? at [redacted]w[redacted]d ?IOL poorly controlled GDM, trying to get pt into active labor. REstart pitocin.  ? ?Fetal Wellbeing:  Category I ?Pain Control:  Labor support without medications ? ?Anticipated MOD:   unclear ? ?Lendon Colonel ?05/08/2021, 3:06 AM ? ?

## 2021-05-08 NOTE — Progress Notes (Signed)
S: ?Doing well, no complaints, pain adequately controlled, mostly back pain, not in labor, eventually planning  epidural ? ?O: ?BP (!) 112/52   Pulse 84   Temp 98.6 ?F (37 ?C) (Axillary)   Resp 18  ? ? ?FHT:  FHR: 140s bpm, variability: moderate,  accelerations:  Present,  decelerations:  Absent ?UC:   irritibitlity ?SVE:   Dilation: 3 ?Effacement (%): 50 ?Station: -3 ?Exam by:: Ernestina Penna, MD ? ? ?A / P:  26 y.o.  ?OB History  ?Gravida Para Term Preterm AB Living  ?2 0 0 0 1 0  ?SAB IAB Ectopic Multiple Live Births  ?1 0 0 0 0  ? at [redacted]w[redacted]d ?IOL for T2DM, slow progress, s/p cytotec x 2, cervical foley, full day pit, 4 hr pit break, now back on pitocin. AROM about 7p on 3/14. Continue to attempt to get pt to active labor.  ? ?Fetal Wellbeing:  Category I ?Pain Control:  Labor support without medications ? ?Anticipated MOD:   unclear, working to get pt to active labor.  ? ?Lendon Colonel ?05/08/2021, 7:41 AM ? ?

## 2021-05-08 NOTE — Progress Notes (Signed)
Labor Progress Note ? ?S/O: Pt resting in recliner chair. Denies feeling ctxs. Requesting to proceed with cesarean section at this time. Patient feels exhausted, uncomfortable and does not want to continue with IOL ? ?Vitals:  ? 05/08/21 1900 05/08/21 2103  ?BP: 117/74 106/72  ?Pulse: 79 87  ?Resp: 16   ?Temp: 98.4 ?F (36.9 ?C)   ? ?SVE: declined, last checked at 1618 4/60/-1 ? ?EFM: cat I baseline 150 bpm min to mod var, no accels, no decels ?Toco: no ctxs detected ? ?A/P: 25Y G2P0010 @ 37.3 IOL for uncontrolled type II diabetes with arrest of dilation, failure to progress ? ?Patient has had prolonged induction course that has included vaginal cytotec, Foley catheter balloon, AROM, Pitocin to 19mU x2 with Pitocin breaks in between, and most recently buccal cytotec. Patient has now been ruptured for over 24 hours. She has not been able to progress past 4 cm and has not shown any signs of active labor. Patient has been extensively counseled on risks of cesarean section including bleeding, infection, damage to surrounding organs, increased risks to future pregnancies including higher risk for repeat cesarean section, DVT/VTE risks, and inherit risks of anesthesia. Additionally, patient understands she is at increased risk for postoperative complications, specifically surgical incision site infection given poorly controlled diabetes. Patient verbalizes understanding of these risks, declines continued trial of labor, and requests proceeding with primary cesarean section. Consents signed at patient bedside. She is also consented for blood transfusion if indicated. ? ?Patient last ate small amount of soup and bread around 2:30 PM earlier today ?Given reassuring fetal status and non-urgent indication, will wait 8 hours of NPO status and plan for OR at 10:30 PM ?OR team notified ?Ancef 3 g and Azithromycin 500 mg IV antibiotic prophylaxis ?SCE VTE prophylaxis ?Plan for Pravena wound vac due to BMI ?Type II DM- FSG now  82 ?Routine intraop care ? ?Rosemae Mcquown A Carmalita Wakefield ?05/08/21 ?9:45 PM ? ?

## 2021-05-08 NOTE — Progress Notes (Signed)
Labor Progress Note ? ?S: Pt resting in bed s/p taking induction break to eat and take a shower. Patient c/o being extremely tired and uncomfortable. Unable to get relief with change in position. Unable to get rest since feeling uncomfortable. Also feeling discouraged by lack of progress and how long induction is taking. She is asking about option for cesarean section. Reports occasional ctx since Pitocin turned off. Patient partner at bedside providing supportive care ? ?O: ?Vitals:  ? 05/08/21 1500 05/08/21 1605  ?BP:  112/62  ?Pulse:  84  ?Resp: 18   ?Temp:    ? ?FSG 167 @ 1600 ? ?SVE: 4/60/-1 no appreciable molding or caput ? ?EFM: cat II baseline 150 bpm mod var +accels, rare occ variable decel ?Toco: rare occ ctx ? ?A/P: 25Y G2P0010 @ 37.3 IOL poorly controlled type II DM ? ?-IOL: prolonged induction course started from 3/13 PM with vaginal cytotec, Foley balloon, AROM, and Pitocin up to 48mU with IUPC showing inadequate contraction pattern. At length discussion had regarding IOL expectations and risks and benefits of continued IOL trying for vaginal delivery versus cesarean section. Since patient has just had a meal, and no indication for urgent delivery, would need to wait 6-8 hours for anesthesia clearance. Reviewed high risk for poor wound healing and postop surgical incision complications due to BMI and poorly controlled Type II DM, would recommend proceeding with continued IOL attempts. Patient is agreeable to continued IOL efforts but if no significant change within 6-8 hours desires primary cesarean section. Will restart with Cytotec buccal 50 mcg q4hr PRN and reassess ?-Cont EFM/Toco- IUPC removed for patient shower, consider replacing if unable to adequately assess ctx pattern ?-Patient may have epidural upon request ?-Type II DM: ISS coverage per protocol, cont q4hr FSG check and inc to q1hr in active labor ?-GBS POS cont PCN protocol ?-Routine intrapartum care ?-Working towards vaginal delivery but  remain guarded given slow progress ? ?Kruti Horacek A Caren Garske ?05/08/21 ?5:27 PM ? ?

## 2021-05-08 NOTE — Progress Notes (Signed)
S: ?Doing well, no complaints, pain worsening despite IV pain medicines.  Patient getting ready to ask for epidural.  Pain mostly in her back ? ?O: ?BP (!) 108/52   Pulse 75   Temp 98.4 ?F (36.9 ?C) (Oral)   Resp 18  ? ? ?FHT:  FHR: 145's bpm, variability: moderate,  accelerations:  Present,  decelerations:  Absent ?UC:   Has been mostly irritability however over the last hour starting to have more regular contractions with Pitocin at 30 milliunits/min.  [place] units had been around 80 but now getting to the low 100s ?SVE:   Dilation: 4 ?Effacement (%): 50 ?Station: -2 ?Exam by:: Dr. Ernestina Penna ? ? ?A / P:  25 y.o.  ?OB History  ?Gravida Para Term Preterm AB Living  ?2 0 0 0 1 0  ?SAB IAB Ectopic Multiple Live Births  ?1 0 0 0 0  ? at [redacted]w[redacted]d ?Induction of labor due to poorly controlled type 2 diabetes.  Slow progress but may be entering active labor.  GBS positive.  On penicillin.  Ruptured since 7 PM on 05/07/2021.  Type 2 diabetes.  Patient has not had any Lantus since the evening prior to admission on 313.  Blood sugars have been overall well controlled but adding sliding scale insulin.  Checking blood sugars every 4 hours ? ?Fetal Wellbeing:  Category I ?Pain Control:   IV pain meds now but expect epidural soon ? ?Anticipated MOD:   Unclear.  Patient has had a prolonged induction of labor but has not been in active labor.  EFW about 7 pounds and pelvis seems adequate.  Fetal station still remains very high but suspect this is due to absence of active labor.  We will continue to follow closely ? ?Lendon Colonel ?05/08/2021, 12:21 PM ? ?

## 2021-05-08 NOTE — Anesthesia Preprocedure Evaluation (Addendum)
Anesthesia Evaluation  ?Patient identified by MRN, date of birth, ID band ?Patient awake ? ? ? ?Reviewed: ?Allergy & Precautions, NPO status , Patient's Chart, lab work & pertinent test results ? ?Airway ?Mallampati: II ? ?TM Distance: >3 FB ?Neck ROM: Full ? ? ? Dental ?no notable dental hx. ? ?  ?Pulmonary ?neg pulmonary ROS,  ?  ?Pulmonary exam normal ?breath sounds clear to auscultation ? ? ? ? ? ? Cardiovascular ?negative cardio ROS ?Normal cardiovascular exam ?Rhythm:Regular Rate:Normal ? ? ?  ?Neuro/Psych ?Anxiety Depression negative neurological ROS ? negative psych ROS  ? GI/Hepatic ?negative GI ROS, Neg liver ROS,   ?Endo/Other  ?negative endocrine ROSdiabetes, GestationalMorbid obesity ? Renal/GU ?negative Renal ROS  ?negative genitourinary ?  ?Musculoskeletal ?negative musculoskeletal ROS ?(+)  ? Abdominal ?(+) + obese,   ?Peds ?negative pediatric ROS ?(+)  Hematology ?negative hematology ROS ?(+) Blood dyscrasia, anemia ,   ?Anesthesia Other Findings ? ? Reproductive/Obstetrics ?negative OB ROS ?(+) Pregnancy ? ?  ? ? ? ? ? ? ? ? ? ? ? ? ? ?  ?  ? ? ? ? ? ? ? ?Anesthesia Physical ?Anesthesia Plan ? ?ASA: 3 ? ?Anesthesia Plan: Spinal  ? ?Post-op Pain Management: Regional block* and Minimal or no pain anticipated  ? ?Induction: Intravenous ? ?PONV Risk Score and Plan: 2 and Ondansetron, Midazolam and Treatment may vary due to age or medical condition ? ?Airway Management Planned: Natural Airway ? ?Additional Equipment:  ? ?Intra-op Plan:  ? ?Post-operative Plan:  ? ?Informed Consent: I have reviewed the patients History and Physical, chart, labs and discussed the procedure including the risks, benefits and alternatives for the proposed anesthesia with the patient or authorized representative who has indicated his/her understanding and acceptance.  ? ? ? ?Dental advisory given ? ?Plan Discussed with: CRNA ? ?Anesthesia Plan Comments:   ? ? ? ? ? ? ?Anesthesia Quick  Evaluation ? ?

## 2021-05-09 ENCOUNTER — Encounter (HOSPITAL_COMMUNITY): Payer: Self-pay | Admitting: Obstetrics and Gynecology

## 2021-05-09 DIAGNOSIS — Z98891 History of uterine scar from previous surgery: Secondary | ICD-10-CM

## 2021-05-09 LAB — CBC
HCT: 31.2 % — ABNORMAL LOW (ref 36.0–46.0)
Hemoglobin: 10.6 g/dL — ABNORMAL LOW (ref 12.0–15.0)
MCH: 29 pg (ref 26.0–34.0)
MCHC: 34 g/dL (ref 30.0–36.0)
MCV: 85.5 fL (ref 80.0–100.0)
Platelets: 166 10*3/uL (ref 150–400)
RBC: 3.65 MIL/uL — ABNORMAL LOW (ref 3.87–5.11)
RDW: 14.6 % (ref 11.5–15.5)
WBC: 13 10*3/uL — ABNORMAL HIGH (ref 4.0–10.5)
nRBC: 0 % (ref 0.0–0.2)

## 2021-05-09 LAB — GLUCOSE, CAPILLARY
Glucose-Capillary: 82 mg/dL (ref 70–99)
Glucose-Capillary: 79 mg/dL (ref 70–99)
Glucose-Capillary: 139 mg/dL — ABNORMAL HIGH (ref 70–99)
Glucose-Capillary: 88 mg/dL (ref 70–99)
Glucose-Capillary: 86 mg/dL (ref 70–99)

## 2021-05-09 MED ORDER — MENTHOL 3 MG MT LOZG: 1.0000 | LOZENGE | OROMUCOSAL | Status: DC | PRN

## 2021-05-09 MED ORDER — MORPHINE SULFATE (PF) 0.5 MG/ML IJ SOLN
INTRAMUSCULAR | Status: DC | PRN
  Administered 2021-05-08: 150 ug via INTRATHECAL

## 2021-05-09 MED ORDER — PRENATAL MULTIVITAMIN CH
1.0000 | ORAL_TABLET | Freq: Every day | ORAL | Status: DC
  Administered 2021-05-09 – 2021-05-11 (×3): 1 via ORAL
  Filled 2021-05-09 (×3): qty 1

## 2021-05-09 MED ORDER — DIPHENHYDRAMINE HCL 25 MG PO CAPS: 25.0000 mg | ORAL_CAPSULE | Freq: Four times a day (QID) | ORAL | Status: DC | PRN

## 2021-05-09 MED ORDER — MEPERIDINE HCL 25 MG/ML IJ SOLN: 6.2500 mg | INTRAMUSCULAR | Status: DC | PRN

## 2021-05-09 MED ORDER — KETOROLAC TROMETHAMINE 30 MG/ML IJ SOLN
30.0000 mg | Freq: Four times a day (QID) | INTRAMUSCULAR | Status: AC
  Administered 2021-05-09: 30 mg via INTRAVENOUS
  Filled 2021-05-09: qty 1

## 2021-05-09 MED ORDER — KETOROLAC TROMETHAMINE 30 MG/ML IJ SOLN
INTRAMUSCULAR | Status: AC
  Filled 2021-05-09: qty 1

## 2021-05-09 MED ORDER — DIPHENHYDRAMINE HCL 25 MG PO CAPS: 25.0000 mg | ORAL_CAPSULE | ORAL | Status: DC | PRN

## 2021-05-09 MED ORDER — INSULIN GLARGINE-YFGN 100 UNIT/ML ~~LOC~~ SOLN
34.0000 [IU] | Freq: Every day | SUBCUTANEOUS | Status: DC
  Filled 2021-05-09 (×2): qty 0.34

## 2021-05-09 MED ORDER — NALOXONE HCL 4 MG/10ML IJ SOLN
1.0000 ug/kg/h | INTRAVENOUS | Status: DC | PRN
  Filled 2021-05-09: qty 5

## 2021-05-09 MED ORDER — PROMETHAZINE HCL 25 MG/ML IJ SOLN: 6.2500 mg | INTRAMUSCULAR | Status: DC | PRN

## 2021-05-09 MED ORDER — IBUPROFEN 600 MG PO TABS
600.0000 mg | ORAL_TABLET | Freq: Four times a day (QID) | ORAL | Status: DC
  Administered 2021-05-09 – 2021-05-11 (×8): 600 mg via ORAL
  Filled 2021-05-09 (×8): qty 1

## 2021-05-09 MED ORDER — OXYCODONE HCL 5 MG PO TABS: 5.0000 mg | ORAL_TABLET | Freq: Once | ORAL | Status: DC | PRN

## 2021-05-09 MED ORDER — DIBUCAINE (PERIANAL) 1 % EX OINT: 1.0000 "application " | TOPICAL_OINTMENT | CUTANEOUS | Status: DC | PRN

## 2021-05-09 MED ORDER — HYDROMORPHONE HCL 1 MG/ML IJ SOLN: 0.2000 mg | INTRAMUSCULAR | Status: DC | PRN

## 2021-05-09 MED ORDER — BUPIVACAINE IN DEXTROSE 0.75-8.25 % IT SOLN
INTRATHECAL | Status: DC | PRN
  Administered 2021-05-08: 1.6 mL via INTRATHECAL

## 2021-05-09 MED ORDER — ZOLPIDEM TARTRATE 5 MG PO TABS: 5.0000 mg | ORAL_TABLET | Freq: Every evening | ORAL | Status: DC | PRN

## 2021-05-09 MED ORDER — OXYCODONE HCL 5 MG PO TABS: 5.0000 mg | ORAL_TABLET | ORAL | Status: DC | PRN

## 2021-05-09 MED ORDER — DIPHENHYDRAMINE HCL 50 MG/ML IJ SOLN: 12.5000 mg | INTRAMUSCULAR | Status: DC | PRN

## 2021-05-09 MED ORDER — OXYTOCIN-SODIUM CHLORIDE 30-0.9 UT/500ML-% IV SOLN: 2.5000 [IU]/h | INTRAVENOUS | Status: AC

## 2021-05-09 MED ORDER — OXYCODONE HCL 5 MG/5ML PO SOLN: 5.0000 mg | Freq: Once | ORAL | Status: DC | PRN

## 2021-05-09 MED ORDER — INSULIN ASPART 100 UNIT/ML IJ SOLN
0.0000 [IU] | Freq: Three times a day (TID) | INTRAMUSCULAR | Status: DC
  Administered 2021-05-09: 3 [IU] via SUBCUTANEOUS

## 2021-05-09 MED ORDER — SODIUM CHLORIDE 0.9% FLUSH: 3.0000 mL | INTRAVENOUS | Status: DC | PRN

## 2021-05-09 MED ORDER — HYDROMORPHONE HCL 1 MG/ML IJ SOLN: 0.2500 mg | INTRAMUSCULAR | Status: DC | PRN

## 2021-05-09 MED ORDER — ACETAMINOPHEN 500 MG PO TABS
1000.0000 mg | ORAL_TABLET | Freq: Four times a day (QID) | ORAL | Status: DC
  Administered 2021-05-09 – 2021-05-11 (×8): 1000 mg via ORAL
  Filled 2021-05-09 (×10): qty 2

## 2021-05-09 MED ORDER — WITCH HAZEL-GLYCERIN EX PADS: 1.0000 "application " | MEDICATED_PAD | CUTANEOUS | Status: DC | PRN

## 2021-05-09 MED ORDER — SIMETHICONE 80 MG PO CHEW
80.0000 mg | CHEWABLE_TABLET | Freq: Three times a day (TID) | ORAL | Status: DC
  Administered 2021-05-09 – 2021-05-11 (×6): 80 mg via ORAL
  Filled 2021-05-09 (×6): qty 1

## 2021-05-09 MED ORDER — SIMETHICONE 80 MG PO CHEW: 80.0000 mg | CHEWABLE_TABLET | ORAL | Status: DC | PRN

## 2021-05-09 MED ORDER — SENNOSIDES-DOCUSATE SODIUM 8.6-50 MG PO TABS
2.0000 | ORAL_TABLET | Freq: Every day | ORAL | Status: DC
  Administered 2021-05-10: 2 via ORAL
  Filled 2021-05-09 (×2): qty 2

## 2021-05-09 MED ORDER — COCONUT OIL OIL: 1.0000 "application " | TOPICAL_OIL | Status: DC | PRN

## 2021-05-09 MED ORDER — KETOROLAC TROMETHAMINE 30 MG/ML IJ SOLN
30.0000 mg | Freq: Once | INTRAMUSCULAR | Status: AC | PRN
  Administered 2021-05-09: 30 mg via INTRAVENOUS

## 2021-05-09 MED ORDER — NALOXONE HCL 0.4 MG/ML IJ SOLN: 0.4000 mg | INTRAMUSCULAR | Status: DC | PRN

## 2021-05-09 MED ORDER — TETANUS-DIPHTH-ACELL PERTUSSIS 5-2.5-18.5 LF-MCG/0.5 IM SUSY: 0.5000 mL | PREFILLED_SYRINGE | Freq: Once | INTRAMUSCULAR | Status: DC

## 2021-05-09 NOTE — Progress Notes (Signed)
? ?  Subjective: ?POD# 1 ?Live born female  ?Birth Weight: 6 lb 11.9 oz (3060 g) ?APGAR: 8, 9 ? ?Newborn Delivery   ?Birth date/time: 05/08/2021 23:21:00 ?Delivery type: C-Section, Low Transverse ?Trial of labor: Yes ?C-section categorization: Primary ?  ?  ?Baby name: Penelope ?Delivering provider: LAW, CASSANDRA A  ? ?Feeding: breast ? ?Pain control at delivery: Spinal  ? ?Reports feeling a bit dizzy with standing.  ?IV site infiltrated, will push PO fluids for now d/t difficult IV placement ? ?Patient reports tolerating PO.   ?Breast symptoms:working on latch ?Pain controlled with  Toradol ?Denies HA/SOB/C/P/N/V/dizziness. Flatus minimal. ?She reports vaginal bleeding as normal, without clots.  Has stood at [facility] once with some dizziness, foley cath in place, dark urine draining. ? ?Objective: ?  VS:  ?  ?Temp:  [98.1 ?F (36.7 ?C)-98.9 ?F (37.2 ?C)] 98.7 ?F (37.1 ?C) (03/16 0930) ?Pulse Rate:  [79-93] 80 (03/16 0500) ?Resp:  [14-29] 20 (03/16 0930) ?BP: (90-123)/(62-92) 115/78 (03/16 0500) ?SpO2:  [97 %-100 %] 99 % (03/16 0930)  ?  ?Intake/Output Summary (Last 24 hours) at 05/09/2021 1239 ?Last data filed at 05/09/2021 0930 ?Gross per 24 hour  ?Intake 3150 ml  ?Output 1478 ml  ?Net 1672 ml  ?   ? ?  ?Recent Labs  ?  05/08/21 ?1247 05/09/21 ?0410  ?WBC 13.2* 13.0*  ?HGB 11.6* 10.6*  ?HCT 35.0* 31.2*  ?PLT 160 166  ?CBG (last 3)  ?Recent Labs  ?  05/09/21 ?0105 05/09/21 ?0530 05/09/21 ?1233  ?GLUCAP 82 79 139*  ?  ? ? Blood type: --/--/O POS (03/14 8546) ? Rubella: Immune (07/27 0000)  ?Vaccines: TDaP          UTD ?        Flu             UTD ?                   COVID-19 UTD ? ? Physical Exam:  ?General: alert, cooperative, and no distress ?CV: Regular rate and rhythm ?Resp: clear ?Abdomen: soft, nontender, normal bowel sounds ?Incision: clean, dry, intact, and Prevena dressing in place ?Uterine Fundus: firm, below umbilicus, nontender ?Lochia: moderate ?Ext: +1 pedal edema, no redness or tenderness in the calves or  thighs ? ?Assessment/Plan: ?26 y.o.   POD# 1. [id number] ?                ?Principal Problem: ?  Postpartum care following cesarean delivery 3/15 ?Active Problems: ?  Encounter for induction of labor ?  Type 2 diabetes mellitus ? - poor control in pregnancy despite insulin and metformin regimen ? - postpartum insulin SS and half dose of previous insulin glargine (34 units at HS) ? - modified carbs diet ? - monitor CBG FBS, AC meals and HS ? - diabetes care coordinator consult as needed ? ?  Status post primary low transverse cesarean section 3/15 - AOD 4 cm ?  Anxiety and depression ? - stable on Lexapro ? - close PP follow up ? ? ?Doing well, stable.    ? Hold on restarting IV site for now as patient tolerating PO ?Will DC foley cath when patient able to ambulate, push PO fluids and continue monitoring UO.          ?Advance diet as tolerated ?Encourage rest when baby rests ?Breastfeeding support ?Encourage to ambulate  ?Routine post-op care ? ?Neta Mends, CNM, MSN ?05/09/2021, 12:39 PM ? ?  ?

## 2021-05-09 NOTE — Op Note (Signed)
Zack Seal ?11-06-1995 ?[id number] ? ?OPERATIVE NOTE ? ?PROCEDURE: primary low transverse cesarean section ? ?PRE-OPERATIVE DIAGNOSIS:  ?Single intrauterine pregnancy 37 weeks 4 days ?Arrest of dilation ?Poorly controlled Type II Diabetes ? ?POST-OPERATIVE DIAGNOSIS: ?Single intrauterine pregnancy 37 weeks 4 days ?Arrest of dilation ?Poorly controlled Type II Diabetes ? ?SURGEON: Clance Boll, DO ? ?ASSISTANT: Dorisann Frames, CNM ? ?FINDINGS: single viable female infant in the vertex presentation apgars 8 and 9 at the 1 and 5 minutes respectively weight 6 pounds 12 ounces ? ?EBL: 878 cc ? ?FLUIDS: 2,300 cc LR ? ?MEDICATIONS: Ancef 3 grams, Azithromycin 500 mg ? ?URINE OUTPUT: 250 cc ? ?COMPLICATIONS: none ? ?PROCEDURE IN DETAIL: ? ?After the patient was appropriately consented she was taken to the operating room where regional anesthesia was obtained without complications. The patient was placed in the dorsal supine position with leftward tilt. Fetal heart tones were obtained and found to be reassuring. A Foley catheter was placed and the bladder was drained for clear yellow urine and remained in place for the duration of the procedure. The patient was prepped and draped in the usual sterile fashion. An appropriate time out was performed that verified the correct patient, procedure, and surgical team.  ? ?The scalpel was used to make a low transverse skin incision. The incision was carried down to the fascia, maintaining hemostasis with the Bovie as needed, The fascia was incised to the left and the right of the midline. The fascia incision was carried laterally using the curved Mayo scissors on either side. The inferior aspect of the fascia was grasped with the Kocher clamps, tented upwards, and the underlying rectus muscle dissected off bluntly and sharply with curved Mayo scissors. The Kocher clamps were removed, placed on the superior aspect of the fascia and the rectus muscles dissected off in a similar  fashion. The rectus muscles were divided at the midline bluntly. The peritoneum was identified, grasped with hemostat clamps and entered sharply with the Metzenbaum scissors. The incision was then extended laterally by bluntly stretching. The bladder blade retractor was introduced. The scalpel was used to make a low transverse uterine incision. The incision was extended caudad and cephalad by bluntly stretching. The anterior placenta was encountered. The infant was found to be in the cephalic presentation. The infant's head was delivered through the hysterotomy while maintaining adequate flexion at the neck. The infant's remaining shoulders and body were subsequently delivered without difficulties. The infant was dried, stimulated, and handed off to the awaiting neonatal team. The placenta was delivered intact by manual extraction. The uterine cavity was cleared of any clot and debris. The hysterotomy was closed using 0-Monocryl in a running locking fashion. A second layer closure was performed using the same suture. Adequate hemostasis of the hysterotomy was noted. The bilateral gutters were cleared of all clot and debris. Bilateral tubes and ovaries were inspected and found to be within normal limits. The underlying fascia planes were inspected and found to be hemostatic. The fascia was closed with 0-Vicryl a normal running fashion. The subcutaneous layer was inspected and hemostasis obtained with the Bovie. The subcutaneous layer was closed with 2-0 plain. The skin layer was closed with 4-0 Vicryl. A Pravena wound dressing was placed. The patient tolerated the procedure well and was taken to the recovery room in stable condition. All instrument, needle, and sponge counts were correct.  ? ?Laiyah Exline A Gohan Collister ?05/09/21 ?12:50 AM ? ? ?

## 2021-05-09 NOTE — Lactation Note (Signed)
This note was copied from a baby's chart. ?Lactation Consultation Note ?Mom chooses to pump and bottle feed. Mom is giving formula until her milk comes in. ?Mom shown how to use DEBP & how to disassemble, clean, & reassemble parts. Mom knows to pump q3h for 15-20 min.  ?Mom pumped and got a glistening of colostrum. ?LC hand expression taught collected 1 ml colostrum and spoon fed to baby. ?Baby has had coffee ground emesis after taking 10 ml formula. ?LC changed blanket and baby's sheet on bassinet. Encouraged FOB to hold baby upright for a little bit. ?Formula feeding sheet given and reviewed. ?Encouraged STS and I&O. ?Encouraged mom to call for questions or concerns. ? ?Patient Name: Allison Friedman ?Today's Date: 05/09/2021 ?Reason for consult: Initial assessment;Early term 37-38.6wks;Maternal endocrine disorder;Primapara;Infant < 6lbs ?Age:58 hours ? ?Maternal Data ?Has patient been taught Hand Expression?: Yes ?Does the patient have breastfeeding experience prior to this delivery?: No ? ?Feeding ?Nipple Type: Slow - flow ? ?LATCH Score ?  ? ?  ? ?Type of Nipple: Everted at rest and after stimulation (very short shaft) ? ?Comfort (Breast/Nipple): Soft / non-tender ? ?  ? ?  ? ? ?Lactation Tools Discussed/Used ?Tools: Pump ?Breast pump type: Double-Electric Breast Pump ?Pump Education: Setup, frequency, and cleaning;Milk Storage ?Reason for Pumping: pump and bottle feeding ?Pumping frequency: q 3 hrs ?Pumped volume: 0 mL ? ?Interventions ?Interventions: Hand express;DEBP;LC Services brochure ? ?Discharge ?  ? ?Consult Status ?Consult Status: Follow-up ?Date: 05/10/21 ?Follow-up type: In-patient ? ? ? ?Charyl Dancer ?05/09/2021, 4:01 AM ? ? ? ?

## 2021-05-09 NOTE — Progress Notes (Signed)
Glucose=79

## 2021-05-09 NOTE — Transfer of Care (Signed)
Immediate Anesthesia Transfer of Care Note ? ?Patient: Allison Friedman ? ?Procedure(s) Performed: CESAREAN SECTION ? ?Patient Location: PACU ? ?Anesthesia Type:Spinal ? ?Level of Consciousness: awake, alert  and oriented ? ?Airway & Oxygen Therapy: Patient Spontanous Breathing ? ?Post-op Assessment: Report given to RN and Post -op Vital signs reviewed and stable ? ?Post vital signs: Reviewed and stable ? ?Last Vitals:  ?Vitals Value Taken Time  ?BP 123/77 05/09/21 0048  ?Temp 36.8 ?C 05/09/21 0048  ?Pulse 87 05/09/21 0052  ?Resp 21 05/09/21 0052  ?SpO2 100 % 05/09/21 0052  ?Vitals shown include unvalidated device data. ? ?Last Pain:  ?Vitals:  ? 05/09/21 0048  ?TempSrc:   ?PainSc: 4   ?   ? ?  ? ?Complications: No notable events documented. ?

## 2021-05-09 NOTE — Social Work (Signed)
MOB was referred for history of depression and anxiety.  ? ?* Referral screened out by Clinical Social Worker because none of the following criteria appear to apply: ? ?~ History of anxiety/depression during this pregnancy, or of post-partum depression following prior delivery. ?~ Diagnosis of anxiety and/or depression within last 3 years. ?OR ?* MOB's symptoms currently being treated with medication and/or therapy. CSW reviewed chart and notes access to Lexapro prescription.  ? ?Please contact the Clinical Social Worker if needs arise, by MOB request, or if MOB scores greater than 9/yes to question 10 on Edinburgh Postpartum Depression Screen. ? ?Allison Hoare, LCSW ?Clinical Social Work ?[facility]  ?[phone]  ?

## 2021-05-09 NOTE — Anesthesia Postprocedure Evaluation (Signed)
Anesthesia Post Note ? ?Patient: Allison Friedman ? ?Procedure(s) Performed: CESAREAN SECTION ? ?  ? ?Patient location during evaluation: PACU ?Anesthesia Type: Spinal ?Level of consciousness: awake and alert ?Pain management: pain level controlled ?Vital Signs Assessment: post-procedure vital signs reviewed and stable ?Respiratory status: spontaneous breathing, nonlabored ventilation and respiratory function stable ?Cardiovascular status: blood pressure returned to baseline and stable ?Postop Assessment: no apparent nausea or vomiting ?Anesthetic complications: no ? ? ?No notable events documented. ? ?Last Vitals:  ?Vitals:  ? 05/09/21 0400 05/09/21 0500  ?BP: 121/85 115/78  ?Pulse: 82 80  ?Resp: 18 18  ?Temp: 36.7 ?C 37.1 ?C  ?SpO2: 98% 98%  ?  ?Last Pain:  ?Vitals:  ? 05/09/21 0500  ?TempSrc: Oral  ?PainSc: 0-No pain  ? ?Pain Goal:   ? ?  ?  ?  ?  ?  ?  ?  ? ?Lowella Curb ? ? ? ? ?

## 2021-05-09 NOTE — Progress Notes (Addendum)
Inpatient Diabetes Program Recommendations ? ?AACE/ADA: New Consensus Statement on Inpatient Glycemic Control (2015) ? ?Target Ranges:  Prepandial:   less than 140 mg/dL ?     Peak postprandial:   less than 180 mg/dL (1-2 hours) ?     Critically ill patients:  140 - 180 mg/dL  ? ?Lab Results  ?Component Value Date  ? GLUCAP 79 05/09/2021  ? ? ?Review of Glycemic Control ? Latest Reference Range & Units 05/09/21 01:05 05/09/21 05:30  ?Glucose-Capillary 70 - 99 mg/dL 82 79  ? ?Diabetes history: DM2 ?Outpatient Diabetes medications:  ? ?DM medications prior to pregnancy-Metformin 500 mg QD ? ?DM medications during pregnancy-Lantus 68 units QD, Metformin 500 mg BID ? ?Current orders for Inpatient glycemic control: Semglee 34 units QHS, Novolog 0-20 units TID ? ?Inpatient Diabetes Program Recommendations:   ? ?1-Add Metformin 500 QD with supper ?2-Discontinue Semglee 34 units  ?3-Decrease correction from Novolog 0-20 units TID to Novolog 0-9 units TID ? ?Addendum@1330  ?Spoke with patient at bedside.  She confirms above home medications.  She states she ate lunch and her CBG was 139 mg/dL 1 hour after eating.  She has a functioning glucometer at home.  Encouraged her to check CBGs a few times a week.  She does not have a PCP. Encouraged her to establish with one for DM2 management.  ? ?Will continue to follow while inpatient. ? ?Thank you, ?Reche Dixon, MSN, RN ?Diabetes Coordinator ?Inpatient Diabetes Program ?[phone] (team pager from 8a-5p) ? ? ? ? ?

## 2021-05-09 NOTE — Anesthesia Procedure Notes (Signed)
Spinal ? ?Patient location during procedure: OB ?Start time: 05/08/2021 10:43 PM ?End time: 05/08/2021 10:48 PM ?Reason for block: surgical anesthesia ?Staffing ?Performed: anesthesiologist  ?Anesthesiologist: Lowella Curb, MD ?Preanesthetic Checklist ?Completed: patient identified, IV checked, risks and benefits discussed, surgical consent, monitors and equipment checked, pre-op evaluation and timeout performed ?Spinal Block ?Patient position: sitting ?Prep: DuraPrep and site prepped and draped ?Patient monitoring: heart rate, cardiac monitor, continuous pulse ox and blood pressure ?Approach: midline ?Location: L3-4 ?Injection technique: single-shot ?Needle ?Needle type: Pencan  ?Needle gauge: 24 G ?Needle length: 10 cm ?Assessment ?Sensory level: T4 ?Events: CSF return ? ? ? ?

## 2021-05-10 LAB — GLUCOSE, CAPILLARY
Glucose-Capillary: 73 mg/dL (ref 70–99)
Glucose-Capillary: 90 mg/dL (ref 70–99)
Glucose-Capillary: 85 mg/dL (ref 70–99)

## 2021-05-10 MED ORDER — POLYETHYLENE GLYCOL 3350 17 G PO PACK
17.0000 g | PACK | Freq: Every day | ORAL | Status: DC
  Administered 2021-05-10: 17 g via ORAL
  Filled 2021-05-10 (×2): qty 1

## 2021-05-10 MED ORDER — METFORMIN HCL 500 MG PO TABS
500.0000 mg | ORAL_TABLET | Freq: Two times a day (BID) | ORAL | Status: DC
  Administered 2021-05-10 – 2021-05-11 (×2): 500 mg via ORAL
  Filled 2021-05-10 (×2): qty 1

## 2021-05-10 MED ORDER — DOCUSATE SODIUM 100 MG PO CAPS
100.0000 mg | ORAL_CAPSULE | Freq: Every day | ORAL | Status: DC
Start: 2021-05-10 — End: 2021-05-11
  Administered 2021-05-10: 100 mg via ORAL
  Filled 2021-05-10 (×2): qty 1

## 2021-05-10 MED ORDER — BUPROPION HCL ER (XL) 150 MG PO TB24
150.0000 mg | ORAL_TABLET | Freq: Every day | ORAL | Status: DC
  Administered 2021-05-10 – 2021-05-11 (×2): 150 mg via ORAL
  Filled 2021-05-10 (×2): qty 1

## 2021-05-10 NOTE — Social Work (Signed)
CSW consulted for "Edinburgh score: 10, pt verbally expressing concern for PPD and desires medication." ? ?CSW met with MOB to assess and provide support. CSW observed infant 'Allison Friedman' sleeping in bassinet and FOB 'Allison Friedman' sleeping on couch. CSW introduced self and role. CSW offered to speak with MOB in private, however MOB declined and stated FOB could remain in room. CSW informed MOB of the reason for consult. MOB was cheerful as she expressed that she is very happy. MOB shared pregnancy was hard due to pelvic pain, leaving her fatigued, however emotionally she is excited to have Allison Friedman. CSW inquired on MOB mental health history. MOB reported she was diagnosed with anxiety in 2019. MOB stated she experienced some anxiety during the pregnancy only related to her body, not the pregnancy. CSW asked MOB how she coped with the feelings. MOB stated she would crochet, read and take care of her dog. MOB identified FOB as also being a positive support. MOB disclosed that she did not take mental health medication while pregnant but she has taken Zoloft and Prozac in the past. MOB stated she was on medication for 3 years before weaning herself off once symptoms improved. MOB expressed having a negative side effects of mood swings with one of the medications, however she could not recall which one. MOB stated the other medication helped her feel energized. MOB was prescribed Lexapro during the pregnancy but expressed she was afraid to take it while pregnant. CSW normalized MOB's feelings. MOB reported she plans to engage in counseling postpartum, which she has done in the past and enjoyed. MOB shared her mother will be staying with her for a month, as postpartum support. MOB denies any current SI or HI. ? ?CSW provided education regarding the baby blues period versus PPD and provided resources. CSW provided the New Mom Checklist and encouraged MOB to self evaluate and contact a medical professional if symptoms are noted at  any time.  ?CSW provided review of Sudden Infant Death Syndrome (SIDS) precautions. MOB stated she has a bassinet and car seat. MOB denies any barriers to infant follow-up care. MOB was receptive to resources received and expressed no additional needs at this time.   ? ?CSW identifies no further need for intervention and no barriers to discharge at this time. ? ?Allison Lis, LCSW ?Clinical Social Work ?[facility] ?[phone] ? ?

## 2021-05-10 NOTE — Progress Notes (Addendum)
Inpatient Diabetes Program Recommendations ? ?AACE/ADA: New Consensus Statement on Inpatient Glycemic Control (2015) ? ?Target Ranges:  Prepandial:   less than 140 mg/dL ?     Peak postprandial:   less than 180 mg/dL (1-2 hours) ?     Critically ill patients:  140 - 180 mg/dL  ? ?Lab Results  ?Component Value Date  ? GLUCAP 86 05/09/2021  ? ? ?Review of Glycemic Control ? Latest Reference Range & Units 05/09/21 05:30 05/09/21 12:33 05/09/21 18:05 05/09/21 22:03  ?Glucose-Capillary 70 - 99 mg/dL 79 403 (H) 88 86  ?(H): Data is abnormally high ?Diabetes history: DM2 ?Outpatient Diabetes medications:  ?  ?DM medications prior to pregnancy-Metformin 500 mg QD ?  ?DM medications during pregnancy-Lantus 68 units QD, Metformin 500 mg BID ?  ?Current orders for Inpatient glycemic control: Semglee 34 units QHS, Novolog 0-20 units TID ?  ?Inpatient Diabetes Program Recommendations:   ? ?Patient refused basal insulin. In agreement.  ? ? Based on current trends consider: ?1-Add Metformin 500 QD with supper ?2-Discontinue Semglee 34 units  ?3-Decrease correction from Novolog 0-20 units TID to Novolog 0-9 units TID ? ?Spoke with Arlan Organ, CNM regarding glucose trends and plan for DM management. Of note A1C prior to pregnancy was 9% on Metformin BID. Assuming diet played a role in this lab value. A GLP would be a great consideration, however if patient is breastfeeding would recommend following up with PCP, being mindful of CHO intake and continue with Metformin based on current inpatient glucose trends. Will follow while inpatient.  ?@0648 : Verified with , RN FSBG in the 80's mg/dL.  ? ?Thanks, ?Ladona Ridgel, MSN, RNC-OB ?Diabetes Coordinator ?[phone] (8a-5p) ? ? ? ?

## 2021-05-10 NOTE — Progress Notes (Signed)
? ?Subjective: ?POD# 2 ?Live born female  ?Birth Weight: 6 lb 11.9 oz (3060 g) ?APGAR: 8, 9 ? ?Newborn Delivery   ?Birth date/time: [birth year]2021 23:21:00 ?Delivery type: C-Section, Low Transverse ?Trial of labor: Yes ?C-section categorization: Primary ?  ?  ?Baby name: Penelope ?Delivering provider: LAW, CASSANDRA A  ? ? ?Feeding: breast and bottle ? ?Pain control at delivery: Spinal  ? ?Reports feeling better today.  ? ?Patient reports tolerating PO.   ?Breast symptoms:working on latch ?Pain controlled with  PO meds ?Denies HA/SOB/C/P/N/V/dizziness. Flatus present. ?She reports vaginal bleeding as normal, without clots.  She is ambulating, urinating without difficulty.    ? ? ?Reports feeling sad and down on herself because she does not feel she is taking good care of her baby, letting the father doing most of the work. She has history of depression and anxiety for witch she took medication and was in counseling, all prior to pregnancy. Patient does not remember which medication she was on but she felt it was giving her more side effects and this is why she stopped it. Per chart patient on Lexapro in the past. Would like to start something again as she is concerned for postpartum depression risk.  ? ?Objective: ?  VS:  ?  ?Vitals:  ? 05/09/21 1805 05/09/21 2203 05/10/21 0645 05/10/21 0850  ?BP: 104/70 118/71 95/77 116/72  ?Pulse: 96 86 82 78  ?Resp: 18 16 16 18   ?Temp: 98.5 ?F (36.9 ?C) 97.9 ?F (36.6 ?C) 97.8 ?F (36.6 ?C) 97.8 ?F (36.6 ?C)  ?TempSrc: Oral Oral Oral Oral  ?SpO2: 99% 99% 100% 97%  ? ?  ?Intake/Output Summary (Last 24 hours) at 05/10/2021 0933 ?Last data filed at 05/09/2021 2200 ?Gross per 24 hour  ?Intake --  ?Output 1450 ml  ?Net -1450 ml  ?   ? ?  ?Recent Labs  ?  05/08/21 ?1247 05/09/21 ?0410  ?WBC 13.2* 13.0*  ?HGB 11.6* 10.6*  ?HCT 35.0* 31.2*  ?PLT 160 166  ?CBG (last 3)  ?Recent Labs  ?  05/09/21 ?1805 05/09/21 ?2203 05/10/21 ?1045  ?GLUCAP 88 86 73  ?  ? ? Blood type: --/--/O POS (03/14  [birth year]1978) ? Rubella: Immune (07/27 0000)  ?Vaccines: TDaP          UTD ?        Flu             UTD ?                   COVID-19 UTD ? ? Physical Exam:  ?General: alert, cooperative, and no distress ?Abdomen: soft, nontender, normal bowel sounds ?Incision: clean, dry, intact, and Prevena dressing patent ?Uterine Fundus: firm, below umbilicus, nontender ?Lochia: minimal ?Ext: trace pedal edema, no redness or tenderness in the calves or thighs ? ?Assessment/Plan: ?26 y.o.   POD# 2. G2P0010  ?                ?Principal Problem: ?  Postpartum care following cesarean delivery 3/15 ? - stable post-op ? - continue routine PP care ?Active Problems: ?  Encounter for induction of labor ?  Type 2 diabetes mellitus ? - CBG's 80's yesterday and insulin glargine held last night with normal fasting this am ? - will change regimen to metformin 500 mg BID and DC insulin ? - continue carb modified diet and CBG monitoring as established.  ? ?  Status post primary low transverse cesarean section 3/15 - AOD 4 cm ?  Anxiety and depression ? - R/B of antidepressants discussed ? - will start patient on Wellbutrin 150ER for now, low weight gain profile  ? - close F/U in office postpartum ? ?POC in consult w/ Dr. Billy Coast.  ? ?Neta Mends, CNM, MSN ?05/10/2021, 9:33 AM ? ?  ?

## 2021-05-10 NOTE — Lactation Note (Signed)
This note was copied from a baby's chart. ?Lactation Consultation Note ? ?Patient Name: Allison Friedman ?Today's Date: 05/10/2021 ?Reason for consult: Follow-up assessment;1st time breastfeeding (Mom's choice is pumping only and formula feeding infant.) ?Age:26 hours ?Per mom, she is  seeing a few drops of colostrum, she has mostly been using the hand pump, LC encouraged mom to use DEBP, mom open to LC's suggestion. ?Daniel reviewed how to use DEBP, mom will pump every 3 hours for 15 minutes on initial setting.  ?LC mention for  mom to lean forward when colostrum is flowing, mom will finger feed infant any EBM that is pumped. ?Per mom, she seen few drops of colostrum when using the hand pump. ?Per mom, infant is taking 30 mls of formula per feeding. ?LC mention EBM is safe at room temperature for 4 hours whereas formula is 1 hour. ?Mom knows to call [place] if she has any BF questions or concerns. ? ?Maternal Data ?  ? ?Feeding ?Mother's Current Feeding Choice: Breast Milk and Formula ? ?LATCH Score ?  ? ?  ? ?  ? ?  ? ?  ? ?  ? ? ?Lactation Tools Discussed/Used ?  ? ?Interventions ?Interventions: Education;DEBP ? ?Discharge ?  ? ?Consult Status ?Consult Status: Follow-up ?Date: 05/11/21 ?Follow-up type: In-patient ? ? ? ?Vicente Serene ?05/10/2021, 3:47 PM ? ? ? ?

## 2021-05-10 NOTE — Plan of Care (Signed)
?  Problem: Education: ?Goal: Knowledge of Childbirth will improve ?Outcome: Progressing ?Goal: Ability to make informed decisions regarding treatment and plan of care will improve ?Outcome: Progressing ?Goal: Ability to state and carry out methods to decrease the pain will improve ?Outcome: Progressing ?Goal: Individualized Educational Video(s) ?Outcome: Progressing ?  ?Problem: Coping: ?Goal: Ability to verbalize concerns and feelings about labor and delivery will improve ?Outcome: Progressing ?  ?Problem: Life Cycle: ?Goal: Ability to make normal progression through stages of labor will improve ?Outcome: Progressing ?Goal: Ability to effectively push during vaginal delivery will improve ?Outcome: Progressing ?  ?Problem: Role Relationship: ?Goal: Will demonstrate positive interactions with the child ?Outcome: Progressing ?  ?Problem: Safety: ?Goal: Risk of complications during labor and delivery will decrease ?Outcome: Progressing ?  ?Problem: Pain Management: ?Goal: Relief or control of pain from uterine contractions will improve ?Outcome: Progressing ?  ?Problem: Education: ?Goal: Knowledge of General Education information will improve ?Description: Including pain rating scale, medication(s)/side effects and non-pharmacologic comfort measures ?Outcome: Progressing ?  ?Problem: Health Behavior/Discharge Planning: ?Goal: Ability to manage health-related needs will improve ?Outcome: Progressing ?  ?Problem: Clinical Measurements: ?Goal: Ability to maintain clinical measurements within normal limits will improve ?Outcome: Progressing ?Goal: Will remain free from infection ?Outcome: Progressing ?Goal: Diagnostic test results will improve ?Outcome: Progressing ?Goal: Respiratory complications will improve ?Outcome: Progressing ?Goal: Cardiovascular complication will be avoided ?Outcome: Progressing ?  ?Problem: Activity: ?Goal: Risk for activity intolerance will decrease ?Outcome: Progressing ?  ?Problem:  Nutrition: ?Goal: Adequate nutrition will be maintained ?Outcome: Progressing ?  ?Problem: Coping: ?Goal: Level of anxiety will decrease ?Outcome: Progressing ?  ?Problem: Elimination: ?Goal: Will not experience complications related to bowel motility ?Outcome: Progressing ?Goal: Will not experience complications related to urinary retention ?Outcome: Progressing ?  ?Problem: Pain Managment: ?Goal: General experience of comfort will improve ?Outcome: Progressing ?  ?Problem: Safety: ?Goal: Ability to remain free from injury will improve ?Outcome: Progressing ?  ?Problem: Skin Integrity: ?Goal: Risk for impaired skin integrity will decrease ?Outcome: Progressing ?  ?Problem: Education: ?Goal: Knowledge of condition will improve ?Outcome: Progressing ?Goal: Individualized Educational Video(s) ?Outcome: Progressing ?Goal: Individualized Newborn Educational Video(s) ?Outcome: Progressing ?  ?Problem: Activity: ?Goal: Will verbalize the importance of balancing activity with adequate rest periods ?Outcome: Progressing ?Goal: Ability to tolerate increased activity will improve ?Outcome: Progressing ?  ?Problem: Coping: ?Goal: Ability to identify and utilize available resources and services will improve ?Outcome: Progressing ?  ?Problem: Life Cycle: ?Goal: Chance of risk for complications during the postpartum period will decrease ?Outcome: Progressing ?  ?Problem: Role Relationship: ?Goal: Ability to demonstrate positive interaction with newborn will improve ?Outcome: Progressing ?  ?Problem: Skin Integrity: ?Goal: Demonstration of wound healing without infection will improve ?Outcome: Progressing ?  ?Pt stable throughout shift. Pt reports pain well-controlled on scheduled oral meds. Pt voided x 2 post foley removal. VS stable. Dressing intact and suction sealed. Pt able to ambulate in room. Continue to monitor and support as needed.  ?

## 2021-05-11 ENCOUNTER — Ambulatory Visit: Payer: Self-pay

## 2021-05-11 LAB — GLUCOSE, CAPILLARY: Glucose-Capillary: 81 mg/dL (ref 70–99)

## 2021-05-11 MED ORDER — OXYCODONE HCL 5 MG PO TABS: 5.0000 mg | ORAL_TABLET | ORAL | 0 refills | Status: DC | PRN

## 2021-05-11 MED ORDER — BUPROPION HCL ER (XL) 150 MG PO TB24: 150.0000 mg | ORAL_TABLET | Freq: Every day | ORAL | 1 refills | Status: DC

## 2021-05-11 MED ORDER — IBUPROFEN 600 MG PO TABS: 600.0000 mg | ORAL_TABLET | Freq: Four times a day (QID) | ORAL | 0 refills | Status: DC

## 2021-05-11 NOTE — Plan of Care (Signed)
?  Problem: Education: ?Goal: Knowledge of General Education information will improve ?Description: Including pain rating scale, medication(s)/side effects and non-pharmacologic comfort measures ?Outcome: Completed/Met ?  ?Problem: Clinical Measurements: ?Goal: Ability to maintain clinical measurements within normal limits will improve ?Outcome: Completed/Met ?Goal: Will remain free from infection ?Outcome: Completed/Met ?Goal: Diagnostic test results will improve ?Outcome: Completed/Met ?Goal: Respiratory complications will improve ?Outcome: Completed/Met ?Goal: Cardiovascular complication will be avoided ?Outcome: Completed/Met ?  ?Problem: Activity: ?Goal: Risk for activity intolerance will decrease ?Outcome: Completed/Met ?  ?Problem: Elimination: ?Goal: Will not experience complications related to bowel motility ?Outcome: Completed/Met ?Goal: Will not experience complications related to urinary retention ?Outcome: Completed/Met ?  ?Problem: Pain Managment: ?Goal: General experience of comfort will improve ?Outcome: Completed/Met ?  ?Problem: Safety: ?Goal: Ability to remain free from injury will improve ?Outcome: Completed/Met ?  ?Problem: Skin Integrity: ?Goal: Risk for impaired skin integrity will decrease ?Outcome: Completed/Met ?  ?Problem: Education: ?Goal: Knowledge of condition will improve ?Outcome: Completed/Met ?  ?

## 2021-05-11 NOTE — Lactation Note (Signed)
This note was copied from a baby's chart. ?Lactation Consultation Note ? ?Patient Name: Allison Friedman ?Today's Date: 05/11/2021 ?Reason for consult: Follow-up assessment (Mom is predominantly formula feeding. Initially her feeding choice was pumping only and formula feeding.) ?Age:26 hours, P1, early term female infant with 5% weight loss.  ?LC discussed with mom importance of using DEBP every 3 hours for 15 minutes if she wants to establish milk supply. ?Per mom, she thinks about pumping, but has not been consistent. Mom reports she has not pumped at all today. ? ? ?Maternal Data ?  ? ?Feeding ?  ? ?LATCH Score ?  ? ?  ? ?  ? ?  ? ?  ? ?  ? ? ?Lactation Tools Discussed/Used ?  ? ?Interventions ?Interventions: Pace feeding;Education;DEBP ? ?Discharge ?  ? ?Consult Status ?Consult Status: Follow-up ?Date: 05/12/21 ?Follow-up type: In-patient ? ? ? ?Jovita Gamma ?05/11/2021, 5:01 PM ? ? ? ?

## 2021-05-11 NOTE — Progress Notes (Signed)
No c/o; pain controlled, no prob voiding; nml lochia; +flatus ?Ambulating and tol po ? ?Upon entering room - pt standing next to baby and pt crying; says she doesn't know how to comfort daughter while she is under phototherapy; told not to touch her, etc; h/o anxiety and depression, says most of her anxiety is about baby - knows that phototherapy will help her but feels bad for her in addition to feeling like she can't help baby ? ?Patient Vitals for the past 24 hrs: ? BP Temp Temp src Pulse Resp SpO2  ?05/11/21 0501 (!) 93/59 97.7 ?F (36.5 ?C) Oral 88 18 --  ?05/10/21 2038 106/64 98.1 ?F (36.7 ?C) Oral 81 18 100 %  ?05/10/21 1500 118/70 97.8 ?F (36.6 ?C) Oral 94 14 99 %  ? ?A&ox3 ?Rrr ?Ctab ?Abd: +bs, soft,nt,nd; fundus firm and below umb; dressing c/d/I; prevena dressing in place ?LE: 1-2+ le, no edema, nt bilat ? ?CBC Latest Ref Rng & Units 05/09/2021 05/08/2021 05/07/2021  ?WBC 4.0 - 10.5 K/uL 13.0(H) 13.2(H) 7.7  ?Hemoglobin 12.0 - 15.0 g/dL 10.6(L) 11.6(L) 12.6  ?Hematocrit 36.0 - 46.0 % 31.2(L) 35.0(L) 38.8  ?Platelets 150 - 400 K/uL 166 160 197  ? ?Bs: am fasting 81, 2 hr pp 80s-90s ? ?A/P: pod 3 s/p 1ltcs ?Doing well - d/c home today, plan 1 wk f/u for dressing removal ?Anxiety/depression - wellbutrin, no support system here, encourage therapy along with meds, will have SW see patient today also since sx worsened sine initially seen; plan 1 wk f/u; call office Mon and can get list of local therapists ?Type 2DM - now on metformin 500mg  bid, carb modified diet; monitor bs and inform if too low or if not controlled ?RH pos ?RI ?Anemia of pregnancy - continue iron q day ?

## 2021-05-11 NOTE — Discharge Summary (Signed)
? ?  Postpartum Discharge Summary ? ?Date of Service updated ? ?   ?Patient Name: Allison Friedman ?DOB: [birth year] ?MRN: [id number] ? ?Date of admission: 05/07/2021 ?Delivery date:05/08/2021  ?Delivering provider: LAW, CASSANDRA A  ?Date of discharge: 05/11/2021 ? ?Admitting diagnosis: Encounter for induction of labor [Z34.90] ?Intrauterine pregnancy: [redacted]w[redacted]d     ?Secondary diagnosis:  Principal Problem: ?  Postpartum care following cesarean delivery 3/15 ?Active Problems: ?  Encounter for induction of labor ?  Type 2 diabetes mellitus ?  Status post primary low transverse cesarean section 3/15 - AOD 4 cm ?  Anxiety and depression ? ?Additional problems: none    ?Discharge diagnosis: Term Pregnancy Delivered, Type 2 DM, and Anemia                                              ?Post partum procedures: none ?Augmentation: AROM and Pitocin, cytotec ?Complications: None ? ?Hospital course: Induction of Labor With Cesarean Section   ?26 y.o. yo G2P0010 at [redacted]w[redacted]d was admitted to the hospital 05/07/2021 for induction of labor. Patient had a labor course significant for arrest of dilation. The patient went for cesarean section due to Arrest of Dilation. Delivery details are as follows: ?Membrane Rupture Time/Date: 6:32 PM ,05/07/2021   ?Delivery Method:C-Section, Low Transverse  ?Details of operation can be found in separate operative Note.  Patient had an uncomplicated postpartum course. She is ambulating, tolerating a regular diet, passing flatus, and urinating well.  Patient is discharged home in stable condition on 05/11/21.     ? ?Newborn Data: ?Birth date:05/08/2021  ?Birth time:11:21 PM  ?Gender:Female  ?Living status:Living  ?Apgars:8 ,9  ?Weight:3060 g                               ? ? ?Physical exam  ?Vitals:  ? 05/10/21 0850 05/10/21 1500 05/10/21 2038 05/11/21 0501  ?BP: 116/72 118/70 106/64 (!) 93/59  ?Pulse: 78 94 81 88  ?Resp: 18 14 18 18   ?Temp: 97.8 ?F (36.6 ?C) 97.8 ?F (36.6 ?C) 98.1 ?F (36.7 ?C) 97.7 ?F (36.5 ?C)   ?TempSrc: Oral Oral Oral Oral  ?SpO2: 97% 99% 100%   ? ?General: alert, cooperative, and no distress ?Lochia: appropriate ?Uterine Fundus: firm ?Incision: Healing well with no significant drainage ?DVT Evaluation: No evidence of DVT seen on physical exam. ?Labs: ?Lab Results  ?Component Value Date  ? WBC 13.0 (H) 05/09/2021  ? HGB 10.6 (L) 05/09/2021  ? HCT 31.2 (L) 05/09/2021  ? MCV 85.5 05/09/2021  ? PLT 166 05/09/2021  ? ?CMP Latest Ref Rng & Units 05/07/2021  ?Glucose 70 - 99 mg/dL 05/09/2021)  ?BUN 6 - 20 mg/dL -  ?Creatinine 0.44 - 1.00 mg/dL -  ?Sodium 135 - 145 mmol/L -  ?Potassium 3.5 - 5.1 mmol/L -  ?Chloride 98 - 111 mmol/L -  ?CO2 22 - 32 mmol/L -  ?Calcium 8.9 - 10.3 mg/dL -  ?Total Protein 6.5 - 8.1 g/dL -  ?Total Bilirubin 0.3 - 1.2 mg/dL -  ?Alkaline Phos 38 - 126 U/L -  ?AST 15 - 41 U/L -  ?ALT 0 - 44 U/L -  ? ?Edinburgh Score: ?Edinburgh Postnatal Depression Scale Screening Tool 05/10/2021  ?I have been able to laugh and see the funny side of things. 0  ?I have looked  forward with enjoyment to things. 0  ?I have blamed myself unnecessarily when things went wrong. 3  ?I have been anxious or worried for no good reason. 2  ?I have felt scared or panicky for no good reason. 1  ?Things have been getting on top of me. 2  ?I have been so unhappy that I have had difficulty sleeping. 0  ?I have felt sad or miserable. 1  ?I have been so unhappy that I have been crying. 1  ?The thought of harming myself has occurred to me. 0  ?Edinburgh Postnatal Depression Scale Total 10  ? ? ? ? ?After visit meds:  ?Allergies as of 05/11/2021   ?No Known Allergies ?  ? ?  ?Medication List  ?  ? ?STOP taking these medications   ? ?insulin glargine 100 UNIT/ML injection ?Commonly known as: LANTUS ?  ?metFORMIN 500 MG tablet ?Commonly known as: GLUCOPHAGE ?  ? ?  ? ?TAKE these medications   ? ?buPROPion 150 MG 24 hr tablet ?Commonly known as: WELLBUTRIN XL ?Take 1 tablet (150 mg total) by mouth daily. ?Start taking on: May 12, 2021 ?  ?ferrous sulfate 325 (65 FE) MG tablet ?Take 325 mg by mouth daily with breakfast. ?  ?FLINSTONES GUMMIES OMEGA-3 DHA PO ?Take by mouth. ?  ?ibuprofen 600 MG tablet ?Commonly known as: ADVIL ?Take 1 tablet (600 mg total) by mouth every 6 (six) hours. ?  ?oxyCODONE 5 MG immediate release tablet ?Commonly known as: Oxy IR/ROXICODONE ?Take 1-2 tablets (5-10 mg total) by mouth every 4 (four) hours as needed for moderate pain. ?  ? ?  ? ? ? ?Discharge home in stable condition ?Infant Feeding: Breast ?Infant Disposition:rooming in ?Discharge instruction: per After Visit Summary and Postpartum booklet. ?Activity: Advance as tolerated. Pelvic rest for 6 weeks.  ?Diet: carb modified diet ?Anticipated Birth Control: Unsure ?Postpartum Appointment:6 weeks ?Additional Postpartum F/U: Postpartum Depression checkup 1 wk ?Future Appointments:No future appointments. ?Follow up Visit: ? Follow-up Information   ? ? Law, Cassandra A, DO Follow up.   ?Specialty: Obstetrics and Gynecology ?Contact information: ?[address] ?[place] [address] ?[phone] ? ? ?  ?  ? ?  ?  ? ?  ? ? ? ?  ? ?05/11/2021 ?Vick Frees, MD ? ? ?

## 2021-05-11 NOTE — Progress Notes (Signed)
CSW met with MOB and FOB at bedside in room 403.  When CSW arrived, FOB was holding infant while infant was on photo lights. MOB was observing FOB and infant interactions and everyone appeared happy and comfortable.  CSW assessed MOB for anx/dep.  MOB openly shared feeling tearful this morning due to infant having to be placed on photo therapy.  MOB stated, "I'm an emotional person any ways and it just made me sad that my baby girl was in the need for therapy at this time." CSW validated and normalized MOB's thoughts and feelings.  CSW offered to have provider to update MOB about infant's health; MOB declined and communicated that she feels well informed about infant's health.  CSW assessed for safety and MOB denied SI and HI and reported feeling attached and bonded to infant.  MOB reported being prescribed Wellbutrin and is expecting to have a decrease in her anxiety and increase in her mood in the next couple of days.  ? ?There are no barriers to discharge.  ? ?Allison Friedman, MSW, LCSW ?Clinical Social Work ?[phone] ? ?

## 2021-05-12 ENCOUNTER — Ambulatory Visit: Payer: Self-pay

## 2021-05-12 NOTE — Lactation Note (Signed)
This note was copied from a baby's chart. ?Lactation Consultation Note ? ?Patient Name: Allison Friedman ?Today's Date: 05/12/2021 ?Reason for consult: Follow-up assessment ?Age:26 days ? ? ?P1 mother whose infant is now 80 hours old.  This is an ETI at 37+3 weeks.  Mother's current feeding preference is to pump and bottle feed her EBM.  She is using formula at this time. ? ?Mother stated that she has not been pumping.  She has been "overstimulated" by all that has been going on since birth.  She desires to wait and begin pumping in her quiet home.  Acknowledged her needs and suggested she begin pumping after the next feeding after arriving home.  She will include breast massage and hand expression.  Discussed supplementation volumes of 30+ mls/feeding session.   ? ?Baby's first pediatric visit is tomorrow.  Mother with a few questions which I answered.  Family packing up belongings.  RN notified. ? ? ?Maternal Data ?  ? ?Feeding ?  ? ?LATCH Score ?  ? ?  ? ?  ? ?  ? ?  ? ?  ? ? ?Lactation Tools Discussed/Used ?  ? ?Interventions ?  ? ?Discharge ?Discharge Education: Engorgement and breast care ? ?Consult Status ?Consult Status: Complete ?Date: 05/12/21 ?Follow-up type: Call as needed ? ? ? ?Lynell Greenhouse R Takyah Ciaramitaro ?05/12/2021, 1:35 PM ? ? ? ?

## 2021-05-13 ENCOUNTER — Telehealth (HOSPITAL_COMMUNITY): Payer: Self-pay | Admitting: *Deleted

## 2021-05-13 NOTE — Telephone Encounter (Signed)
SW consult during inpatient stay revealed EPDS of 10. Dr. Amado Nash notified via fax. ?

## 2021-05-15 DIAGNOSIS — E119 Type 2 diabetes mellitus without complications: Secondary | ICD-10-CM | POA: Diagnosis not present

## 2021-05-15 DIAGNOSIS — F32A Depression, unspecified: Secondary | ICD-10-CM | POA: Diagnosis not present

## 2021-05-15 DIAGNOSIS — R3915 Urgency of urination: Secondary | ICD-10-CM | POA: Diagnosis not present

## 2021-05-27 DIAGNOSIS — M79644 Pain in right finger(s): Secondary | ICD-10-CM | POA: Diagnosis not present

## 2021-05-27 DIAGNOSIS — L03011 Cellulitis of right finger: Secondary | ICD-10-CM | POA: Diagnosis not present

## 2021-05-27 DIAGNOSIS — Z6841 Body Mass Index (BMI) 40.0 and over, adult: Secondary | ICD-10-CM | POA: Diagnosis not present

## 2021-05-28 DIAGNOSIS — L03011 Cellulitis of right finger: Secondary | ICD-10-CM | POA: Diagnosis not present

## 2021-06-18 ENCOUNTER — Encounter (HOSPITAL_COMMUNITY): Payer: Self-pay | Admitting: Obstetrics

## 2021-06-18 ENCOUNTER — Other Ambulatory Visit: Payer: Self-pay

## 2021-06-18 ENCOUNTER — Inpatient Hospital Stay (HOSPITAL_COMMUNITY)
Admission: AD | Admit: 2021-06-18 | Discharge: 2021-06-18 | Disposition: A | Discharge status: Home / Self Care | Payer: BC Managed Care – PPO | Attending: Obstetrics | Admitting: Obstetrics

## 2021-06-18 DIAGNOSIS — N920 Excessive and frequent menstruation with regular cycle: Secondary | ICD-10-CM | POA: Insufficient documentation

## 2021-06-18 LAB — CBC
HCT: 38 % (ref 36.0–46.0)
Hemoglobin: 12.3 g/dL (ref 12.0–15.0)
MCH: 27.8 pg (ref 26.0–34.0)
MCHC: 32.4 g/dL (ref 30.0–36.0)
MCV: 86 fL (ref 80.0–100.0)
Platelets: 245 10*3/uL (ref 150–400)
RBC: 4.42 MIL/uL (ref 3.87–5.11)
RDW: 13.9 % (ref 11.5–15.5)
WBC: 6.8 10*3/uL (ref 4.0–10.5)
nRBC: 0 % (ref 0.0–0.2)

## 2021-06-18 NOTE — MAU Note (Signed)
Allison Friedman is a 26 y.o. at Unknown here in MAU reporting: VB that began yesterday, passing small clots.  States has continued to bleed today, and has changed sanitary napkins every couple hours. ?S/P Cesarean 05/08/2021.  Reports isn't nursing. ? ?Onset of complaint: yesterday ?Pain score: 0 ?Vitals:  ? 06/18/21 1824  ?BP: 122/79  ?Pulse: 97  ?Resp: 18  ?Temp: 98.6 ?F (37 ?C)  ?SpO2: 97%  ?   ?FHT:N/A ?Lab orders placed from triage:  None  ?

## 2021-06-18 NOTE — MAU Provider Note (Signed)
?History  ?  ? ?[id number] ? ?Arrival date and time: 06/18/21 1806 ?  ? ?Chief Complaint  ?Patient presents with  ? Vaginal Bleeding  ? ? ? ?HPI ?Allison Friedman is a 26 y.o. at 6 weeks post partum who presents for vaginal bleeding. Had primary c/section on 3/15 for failure to progress. Stopped bleeding from delivery 2-3 weeks ago. Reports bleeding started yesterday. States she woke up this morning with her underwear saturated. Has had to change a full pad every 2 hours. Denies abdominal pain or fever. No contraception. Is not breastfeeding. Has had intercourse once since delivery; 3 days ago.  ? ?OB History   ? ? Gravida  ?2  ? Para  ?1  ? Term  ?1  ? Preterm  ?   ? AB  ?1  ? Living  ?1  ?  ? ? SAB  ?1  ? IAB  ?   ? Ectopic  ?   ? Multiple  ?   ? Live Births  ?1  ?   ?  ?  ? ? ?Past Medical History:  ?Diagnosis Date  ? Anemia   ? Anxiety   ? Depression   ? Diabetes mellitus without complication (HCC)   ? ? ?Past Surgical History:  ?Procedure Laterality Date  ? CESAREAN SECTION N/A 05/08/2021  ? Procedure: CESAREAN SECTION;  Surgeon: Toy Baker, DO;  Location: [facility]S;  Service: Obstetrics;  Laterality: N/A;  ? CYST REMOVAL NECK    ? infected sweat gland - cyst removal on the chest  ? NECK SURGERY    ? ? ?Family History  ?Problem Relation Age of Onset  ? Diabetes Mother   ? Hypertension Mother   ? Hypertension Father   ? ? ?No Known Allergies ? ?No current facility-administered medications on file prior to encounter.  ? ?Current Outpatient Medications on File Prior to Encounter  ?Medication Sig Dispense Refill  ? buPROPion (WELLBUTRIN XL) 150 MG 24 hr tablet Take 1 tablet (150 mg total) by mouth daily. 30 tablet 1  ? metFORMIN (GLUCOPHAGE) 500 MG tablet Take by mouth 2 (two) times daily with a meal.    ? ferrous sulfate 325 (65 FE) MG tablet Take 325 mg by mouth daily with breakfast.    ? ibuprofen (ADVIL) 600 MG tablet Take 1 tablet (600 mg total) by mouth every 6 (six) hours. 30 tablet 0  ? oxyCODONE (OXY  IR/ROXICODONE) 5 MG immediate release tablet Take 1-2 tablets (5-10 mg total) by mouth every 4 (four) hours as needed for moderate pain. 30 tablet 0  ? Pediatric Multiple Vitamins (FLINSTONES GUMMIES OMEGA-3 DHA PO) Take by mouth.    ? ? ? ?ROS ?Pertinent positives and negative per HPI, all others reviewed and negative ? ?Physical Exam  ? ?BP 126/64 (BP Location: Left Arm)   Pulse 76   Temp 98.6 ?F (37 ?C) (Oral)   Resp 18   Ht 5\' 4"  (1.626 m)   Wt 124.7 kg   SpO2 97%   BMI 47.19 kg/m?  ? ?Patient Vitals for the past 24 hrs: ? BP Temp Temp src Pulse Resp SpO2 Height Weight  ?06/18/21 1945 126/64 -- -- 76 -- -- -- --  ?06/18/21 1824 122/79 98.6 ?F (37 ?C) Oral 97 18 97 % -- --  ?06/18/21 1816 -- -- -- -- -- -- 5\' 4"  (1.626 m) 124.7 kg  ? ? ?Orthostatic VS for the past 24 hrs: ? BP- Lying Pulse- Lying BP- Sitting Pulse-  Sitting BP- Standing at 0 minutes Pulse- Standing at 0 minutes BP-Standing at 3 minutes Pulse-Standing at 3 minutes  ?06/18/21 1900 94/43 86 129/67 78 103/87 98 119/76 98  ? ?Physical Exam ?Vitals and nursing note reviewed. Exam conducted with a chaperone present.  ?Constitutional:   ?   General: She is not in acute distress. ?   Appearance: Normal appearance. She is not ill-appearing or diaphoretic.  ?HENT:  ?   Head: Normocephalic and atraumatic.  ?Eyes:  ?   Conjunctiva/sclera: Conjunctivae normal.  ?   Pupils: Pupils are equal, round, and reactive to light.  ?Abdominal:  ?   Palpations: Abdomen is soft.  ?   Tenderness: There is no abdominal tenderness.  ?Genitourinary: ?   Comments: Pelvic exam: NEFG. Scant dark red blood in vagina. No active bleeding from os.  ?Skin: ?   General: Skin is warm and dry.  ?   Coloration: Skin is not pale.  ?Neurological:  ?   Mental Status: She is alert.  ?  ? ? ? ?Labs ?Results for orders placed or performed during the hospital encounter of 06/18/21 (from the past 24 hour(s))  ?CBC     Status: None  ? Collection Time: 06/18/21  6:51 PM  ?Result Value Ref  Range  ? WBC 6.8 4.0 - 10.5 K/uL  ? RBC 4.42 3.87 - 5.11 MIL/uL  ? Hemoglobin 12.3 12.0 - 15.0 g/dL  ? HCT 38.0 36.0 - 46.0 %  ? MCV 86.0 80.0 - 100.0 fL  ? MCH 27.8 26.0 - 34.0 pg  ? MCHC 32.4 30.0 - 36.0 g/dL  ? RDW 13.9 11.5 - 15.5 %  ? Platelets 245 150 - 400 K/uL  ? nRBC 0.0 0.0 - 0.2 %  ? ? ?Imaging ?No results found. ? ?MAU Course  ?Procedures ?Lab Orders    ?     CBC    ?No orders of the defined types were placed in this encounter. ? ?Imaging Orders  ?No imaging studies ordered today  ? ? ?MDM ?Patient nearly 6 weeks post partum. Vital signs normal. Not orthostatic. Minimal bleeding on exam. Hemoglobin stable. Bleeding is likely her first period since delivery.  ?Assessment and Plan  ? ?1. Menorrhagia with regular cycle   ? ?-Reviewed bleeding precautions & reasons to return to hospital ?-Keep scheduled appointment at [facility] on Friday ? ?Judeth Horn, NP ?06/18/21 ?7:47 PM ? ? ?

## 2021-06-18 NOTE — Discharge Instructions (Signed)
Return to care  If you have heavier bleeding that soaks through more than 2 pads per hour for an hour or more If you bleed so much that you feel like you might pass out or you do pass out If you have significant abdominal pain that is not improved with Tylenol   

## 2021-06-24 DIAGNOSIS — E119 Type 2 diabetes mellitus without complications: Secondary | ICD-10-CM | POA: Diagnosis not present

## 2021-06-24 DIAGNOSIS — Z3202 Encounter for pregnancy test, result negative: Secondary | ICD-10-CM | POA: Diagnosis not present

## 2021-06-24 DIAGNOSIS — Z30430 Encounter for insertion of intrauterine contraceptive device: Secondary | ICD-10-CM | POA: Diagnosis not present

## 2021-06-24 DIAGNOSIS — Z6841 Body Mass Index (BMI) 40.0 and over, adult: Secondary | ICD-10-CM | POA: Diagnosis not present

## 2021-07-25 DIAGNOSIS — E119 Type 2 diabetes mellitus without complications: Secondary | ICD-10-CM | POA: Diagnosis not present

## 2021-07-25 DIAGNOSIS — Z30431 Encounter for routine checking of intrauterine contraceptive device: Secondary | ICD-10-CM | POA: Diagnosis not present

## 2021-08-08 ENCOUNTER — Ambulatory Visit: Payer: BC Managed Care – PPO | Admitting: Family Medicine

## 2021-08-08 ENCOUNTER — Encounter: Payer: Self-pay | Admitting: Family Medicine

## 2021-08-08 VITALS — BP 112/78 | HR 78 | Temp 97.3°F | Ht 64.0 in | Wt 273.1 lb

## 2021-08-08 DIAGNOSIS — K219 Gastro-esophageal reflux disease without esophagitis: Secondary | ICD-10-CM | POA: Diagnosis not present

## 2021-08-08 DIAGNOSIS — E1165 Type 2 diabetes mellitus with hyperglycemia: Secondary | ICD-10-CM | POA: Diagnosis not present

## 2021-08-08 DIAGNOSIS — F339 Major depressive disorder, recurrent, unspecified: Secondary | ICD-10-CM

## 2021-08-08 DIAGNOSIS — F411 Generalized anxiety disorder: Secondary | ICD-10-CM

## 2021-08-08 MED ORDER — FAMOTIDINE 40 MG PO TABS: 20.0000 mg | ORAL_TABLET | Freq: Two times a day (BID) | ORAL | 1 refills | Status: DC | PRN

## 2021-08-08 MED ORDER — CITALOPRAM HYDROBROMIDE 40 MG PO TABS: 40.0000 mg | ORAL_TABLET | Freq: Every day | ORAL | 3 refills | Status: DC

## 2021-08-08 NOTE — Patient Instructions (Signed)

## 2021-08-08 NOTE — Progress Notes (Signed)
New Patient Office Visit  Subjective    Patient ID: Allison Friedman, female    DOB: November 22, 1995  Age: 26 y.o. MRN: [id number]  CC:  Chief Complaint  Patient presents with   New Patient (Initial Visit)    HPI Allison Friedman presents to establish care.   She has recently been diagnosed with T2DM. Reports that her a1c during her recent pregnany was 9.2. She delivered 3 months ago. At her last appt with her OB on 07/25/21, her a1c was 6.4. She was started on ozempic. She has noticed decreased appetite, earlier fullness, and smaller portion sizes since starting. She has also been more fatigued and nauseous as well. She also reports some indigestion. She reports that her fasting blood sugars have been around 120.   She has been on wellbutrin for 3 months. Reports that she has not noticed an improvement in her symptoms. She denies SI. She has tried prozac and zoloft in the past. She reports that one of these made her have suicidal thoughts but cannot remember which one. She would like to try another medication to help with her symptoms.      08/08/2021   11:33 AM 10/17/2020    2:25 PM  Depression screen PHQ 2/9  Decreased Interest 0 0  Down, Depressed, Hopeless 1 0  PHQ - 2 Score 1 0  Altered sleeping 2   Tired, decreased energy 2   Change in appetite 0   Feeling bad or failure about yourself  0   Trouble concentrating 0   Moving slowly or fidgety/restless 0   Suicidal thoughts 0   PHQ-9 Score 5   Difficult doing work/chores Not difficult at all       08/08/2021   11:33 AM  GAD 7 : Generalized Anxiety Score  Nervous, Anxious, on Edge 1  Control/stop worrying 0  Worry too much - different things 1  Trouble relaxing 1  Restless 0  Easily annoyed or irritable 1  Afraid - awful might happen 0  Total GAD 7 Score 4  Anxiety Difficulty Not difficult at all      Outpatient Encounter Medications as of 08/08/2021  Medication Sig   buPROPion (WELLBUTRIN XL) 150 MG 24 hr tablet Take 1  tablet (150 mg total) by mouth daily.   ibuprofen (ADVIL) 600 MG tablet Take 1 tablet (600 mg total) by mouth every 6 (six) hours.   OZEMPIC, 0.25 OR 0.5 MG/DOSE, 2 MG/3ML SOPN Inject 0.25 mg into the skin once a week.   [DISCONTINUED] metFORMIN (GLUCOPHAGE) 500 MG tablet Take by mouth 2 (two) times daily with a meal.   No facility-administered encounter medications on file as of 08/08/2021.    Past Medical History:  Diagnosis Date   Anemia    Anxiety    Childhood asthma    Depression    Diabetes mellitus without complication ([place])     Past Surgical History:  Procedure Laterality Date   CESAREAN SECTION N/A 05/08/2021   Procedure: CESAREAN SECTION;  Surgeon: Armandina Stammer, DO;  Location: [facility] ORS;  Service: Obstetrics;  Laterality: N/A;   CYST REMOVAL NECK     infected sweat gland - cyst removal on the chest   NECK SURGERY      Family History  Problem Relation Age of Onset   Depression Mother    Arthritis Mother    Anxiety disorder Mother    Diabetes Mother    Hypertension Mother    Diabetes Father    Alcohol  abuse Father    Hypertension Father    Congenital Murmur Father    Heart attack Father        late 52's or early 55's   Hearing loss Brother        born deaf   Asthma Brother     Social History   Socioeconomic History   Marital status: Married    Spouse name: Allison Friedman   Number of children: 1   Years of education: 12   Highest education level: High school graduate  Occupational History   Not on file  Tobacco Use   Smoking status: Never   Smokeless tobacco: Never  Vaping Use   Vaping Use: Never used  Substance and Sexual Activity   Alcohol use: Never   Drug use: Never   Sexual activity: Not Currently    Birth control/protection: I.U.D.    Comment: Paraguard  Other Topics Concern   Not on file  Social History Narrative   Not on file   Social Determinants of Health   Financial Resource Strain: Not on file  Food Insecurity: Not on file   Transportation Needs: Not on file  Physical Activity: Not on file  Stress: Not on file  Social Connections: Not on file  Intimate Partner Violence: Not on file    Review of Systems  Constitutional:  Negative for chills, diaphoresis, fever and weight loss.  Eyes:  Negative for blurred vision, double vision and photophobia.  Respiratory:  Negative for cough and wheezing.   Cardiovascular:  Negative for chest pain, palpitations and leg swelling.  Gastrointestinal:  Positive for heartburn and nausea. Negative for abdominal pain and vomiting.  Neurological:  Negative for dizziness, seizures, loss of consciousness and weakness.  Psychiatric/Behavioral:  Positive for depression. Negative for hallucinations and suicidal ideas. The patient is nervous/anxious.          Objective    BP 112/78   Pulse 78   Temp (!) 97.3 F (36.3 C) (Temporal)   Ht '5\' 4"'  (1.626 m)   Wt 273 lb 2 oz (123.9 kg)   SpO2 98%   BMI 46.88 kg/m   Physical Exam Vitals and nursing note reviewed.  Constitutional:      General: She is not in acute distress.    Appearance: She is obese. She is not ill-appearing, toxic-appearing or diaphoretic.  Cardiovascular:     Rate and Rhythm: Normal rate and regular rhythm.     Heart sounds: Normal heart sounds. No murmur heard. Pulmonary:     Effort: Pulmonary effort is normal. No respiratory distress.     Breath sounds: Normal breath sounds. No wheezing.  Abdominal:     General: Bowel sounds are normal. There is no distension.     Palpations: Abdomen is soft.     Tenderness: There is no abdominal tenderness. There is no guarding or rebound.  Musculoskeletal:     Right lower leg: No edema.     Left lower leg: No edema.  Skin:    General: Skin is warm and dry.  Neurological:     General: No focal deficit present.     Mental Status: She is alert and oriented to person, place, and time.  Psychiatric:        Mood and Affect: Mood normal.        Behavior: Behavior  normal.        Thought Content: Thought content normal.        Judgment: Judgment normal.  Assessment & Plan:   [name] was seen today for new patient (initial visit).  Diagnoses and all orders for this visit:  Type 2 diabetes mellitus with hyperglycemia, without long-term current use of insulin (HCC) A1c 6.4. Labs pending as below. Discussed statin pending lipid panel. She is fasting this morning. Discussed nutrition, diet, and ozempic in detail. Increase ozempic to 0.5 mg weekly after 4 week at the 0.25 mg dosage.  -     CBC with Differential/Platelet -     CMP14+EGFR -     Lipid panel  Morbid obesity (HCC) BMI 46. Diet and exercise.   Gastroesophageal reflux disease without esophagitis Try pepcid as below.  -     famotidine (PEPCID) 40 MG tablet; Take 0.5 tablets (20 mg total) by mouth 2 (two) times daily as needed for heartburn or indigestion.  Generalized anxiety disorder Depression, recurrent (HCC) Try celexa as below. Discussed can directly switch over. Denies SI.  -     citalopram (CELEXA) 40 MG tablet; Take 1 tablet (40 mg total) by mouth daily.   Return in about 4 weeks (around 09/05/2021) for for medication follow up, can schedule new DM with Almyra Free anytime for education.   The patient indicates understanding of these issues and agrees with the plan.  Gwenlyn Perking, FNP

## 2021-08-09 ENCOUNTER — Other Ambulatory Visit: Payer: Self-pay | Admitting: Family Medicine

## 2021-08-09 DIAGNOSIS — E785 Hyperlipidemia, unspecified: Secondary | ICD-10-CM

## 2021-08-09 DIAGNOSIS — E1169 Type 2 diabetes mellitus with other specified complication: Secondary | ICD-10-CM

## 2021-08-09 LAB — CBC WITH DIFFERENTIAL/PLATELET
Basophils Absolute: 0.1 10*3/uL (ref 0.0–0.2)
Basos: 1 %
EOS (ABSOLUTE): 0.2 10*3/uL (ref 0.0–0.4)
Eos: 2 %
Hematocrit: 42.4 % (ref 34.0–46.6)
Hemoglobin: 14 g/dL (ref 11.1–15.9)
Immature Grans (Abs): 0 10*3/uL (ref 0.0–0.1)
Immature Granulocytes: 0 %
Lymphocytes Absolute: 1.8 10*3/uL (ref 0.7–3.1)
Lymphs: 25 %
MCH: 27.2 pg (ref 26.6–33.0)
MCHC: 33 g/dL (ref 31.5–35.7)
MCV: 83 fL (ref 79–97)
Monocytes Absolute: 0.5 10*3/uL (ref 0.1–0.9)
Monocytes: 6 %
Neutrophils Absolute: 4.8 10*3/uL (ref 1.4–7.0)
Neutrophils: 66 %
Platelets: 263 10*3/uL (ref 150–450)
RBC: 5.14 x10E6/uL (ref 3.77–5.28)
RDW: 13.6 % (ref 11.7–15.4)
WBC: 7.3 10*3/uL (ref 3.4–10.8)

## 2021-08-09 LAB — CMP14+EGFR
ALT: 71 IU/L — ABNORMAL HIGH (ref 0–32)
AST: 40 IU/L (ref 0–40)
Albumin/Globulin Ratio: 1.4 (ref 1.2–2.2)
Albumin: 4.5 g/dL (ref 3.9–5.0)
Alkaline Phosphatase: 84 IU/L (ref 44–121)
BUN/Creatinine Ratio: 11 (ref 9–23)
BUN: 9 mg/dL (ref 6–20)
Bilirubin Total: 0.2 mg/dL (ref 0.0–1.2)
CO2: 22 mmol/L (ref 20–29)
Calcium: 9.9 mg/dL (ref 8.7–10.2)
Chloride: 101 mmol/L (ref 96–106)
Creatinine, Ser: 0.8 mg/dL (ref 0.57–1.00)
Globulin, Total: 3.2 g/dL (ref 1.5–4.5)
Glucose: 105 mg/dL — ABNORMAL HIGH (ref 70–99)
Potassium: 4.6 mmol/L (ref 3.5–5.2)
Sodium: 141 mmol/L (ref 134–144)
Total Protein: 7.7 g/dL (ref 6.0–8.5)
eGFR: 105 mL/min/{1.73_m2} (ref >59)

## 2021-08-09 LAB — LIPID PANEL
Chol/HDL Ratio: 4.5 ratio — ABNORMAL HIGH (ref 0.0–4.4)
Cholesterol, Total: 172 mg/dL (ref 100–199)
HDL: 38 mg/dL — ABNORMAL LOW (ref >39)
LDL Chol Calc (NIH): 108 mg/dL — ABNORMAL HIGH (ref 0–99)
Triglycerides: 144 mg/dL (ref 0–149)
VLDL Cholesterol Cal: 26 mg/dL (ref 5–40)

## 2021-08-09 MED ORDER — ATORVASTATIN CALCIUM 20 MG PO TABS: 20.0000 mg | ORAL_TABLET | Freq: Every day | ORAL | 3 refills | Status: DC

## 2021-08-29 ENCOUNTER — Ambulatory Visit (INDEPENDENT_AMBULATORY_CARE_PROVIDER_SITE_OTHER): Payer: BC Managed Care – PPO | Admitting: Pharmacist

## 2021-08-29 DIAGNOSIS — E1165 Type 2 diabetes mellitus with hyperglycemia: Secondary | ICD-10-CM | POA: Diagnosis not present

## 2021-08-29 MED ORDER — SEMAGLUTIDE (1 MG/DOSE) 4 MG/3ML ~~LOC~~ SOPN: 1.0000 mg | PEN_INJECTOR | SUBCUTANEOUS | 5 refills | Status: DC

## 2021-08-29 MED ORDER — FREESTYLE LIBRE 2 SENSOR MISC: 5 refills | Status: DC

## 2021-08-29 NOTE — Progress Notes (Signed)
    08/29/2021 Name: Allison Friedman MRN: [id number] DOB: 09/11/1995   S:  25 yoF Presents for diabetes evaluation, education, and management.  Patient was referred and last seen by Primary Care Provider on 08/08/21. Patient reports her T2 diabetes was diagnosed during pregnancy.  She is now 4 months post-partum.  She was pre-diabetic before pregnancy.  During pregnancy, her A1c increased to 9.2% and she was placed on metformin.  She did not tolerate metformin due to GI side effects/intestinal cramps.  She was then transitioned to insulin glargine for the remainder of her pregnancy.  Patient is now well controlled (A1c 6.4%) on Ozempic 0.5mg  (on 2nd dose).  She is interested in additional weight loss.  She is not breastfeeding.  Insurance coverage/medication affordability: bcbs  Patient reports adherence with medications. Current diabetes medications include: ozempic Current hypertension medications include: n/a Goal 130/80 Current hyperlipidemia medications include: atorvastatin  Patient denies hypoglycemic events.   Patient reported dietary habits: Eats 3 meals/day Discussed meal planning options and Plate method for healthy eating Avoid sugary drinks and desserts Incorporate balanced protein, non starchy veggies, 1 serving of carbohydrate with each meal Increase water intake Increase physical activity as able  Patient-reported exercise habits: encouraged  O:  A1c 6.2% at OB appt 1 month ago   Lipid Panel     Component Value Date/Time   CHOL 172 08/08/2021 1424   TRIG 144 08/08/2021 1424   HDL 38 (L) 08/08/2021 1424   CHOLHDL 4.5 (H) 08/08/2021 1424   LDLCALC 108 (H) 08/08/2021 1424     Home fasting blood sugars: <130  2 hour post-meal/random blood sugars: not checking-will see if we can get libre 2 CGM.    Clinical Atherosclerotic Cardiovascular Disease (ASCVD): No   The ASCVD Risk score (Arnett DK, et al., 2019) failed to calculate for the following reasons:   The  2019 ASCVD risk score is only valid for ages 72 to 35    A/P:  Diabetes T2DM currently controlled.  Patient feels her post prandials may be increased, but she is not checking.  Will try to get Libre 2 CGM covered. Intolerance to metformin (updated allergy list).  Patient would like additional weight loss, therefore we will escalate GLP1 therapy as directed below:  -Increase Ozempic to 1mg  sq weekly after 2 more weeks of Ozempic 0.5mg  weekly Denies personal and family history of Medullary thyroid cancer (MTC) Patient has IUD in place Tolerating well Plan to increase to 2mg  weekly as tolerated Consider mounjaro, but insurance will make try/fail 2 or more GLP1s 1st  -Extensively discussed pathophysiology of diabetes, recommended lifestyle interventions, dietary effects on blood sugar control  -Counseled on s/sx of and management of hypoglycemia  -Next A1C anticipated 3 months.   Written patient instructions provided.  Total time in face to face counseling 25 minutes.   Follow up PCP Clinic Visit ON 09/05/21.   [place], PharmD, BCPS Clinical Pharmacist, [facility]  II Phone [phone]

## 2021-09-05 ENCOUNTER — Ambulatory Visit: Payer: BC Managed Care – PPO | Admitting: Family Medicine

## 2021-09-06 ENCOUNTER — Inpatient Hospital Stay (INDEPENDENT_AMBULATORY_CARE_PROVIDER_SITE_OTHER): Payer: BC Managed Care – PPO

## 2021-09-06 ENCOUNTER — Encounter: Payer: Self-pay | Admitting: Family Medicine

## 2021-09-06 ENCOUNTER — Ambulatory Visit: Payer: BC Managed Care – PPO | Admitting: Family Medicine

## 2021-09-06 VITALS — BP 148/89 | HR 104 | Temp 97.5°F | Resp 20 | Ht 64.0 in | Wt 277.0 lb

## 2021-09-06 DIAGNOSIS — F411 Generalized anxiety disorder: Secondary | ICD-10-CM

## 2021-09-06 DIAGNOSIS — E1165 Type 2 diabetes mellitus with hyperglycemia: Secondary | ICD-10-CM

## 2021-09-06 DIAGNOSIS — R002 Palpitations: Secondary | ICD-10-CM | POA: Diagnosis not present

## 2021-09-06 DIAGNOSIS — R079 Chest pain, unspecified: Secondary | ICD-10-CM | POA: Diagnosis not present

## 2021-09-06 DIAGNOSIS — F339 Major depressive disorder, recurrent, unspecified: Secondary | ICD-10-CM | POA: Diagnosis not present

## 2021-09-06 NOTE — Progress Notes (Signed)
Established Patient Office Visit  Subjective   Patient ID: Allison Friedman, female    DOB: 09/21/1995  Age: 26 y.o. MRN: [id number]  Chief Complaint  Patient presents with   Depression    Depression        Allison Friedman is here for follow up of anxiety and depression. She has noticed some improvement with Celexa compared to Wellbutrin. She denies side effects. She would like to be referred to psychiatry. She was seen by psych prior to her move here from [place] and they were planning to do further evaluation for the possibility of BPD.    She would also like to discuss palpitations today. She reports palpitations daily that occur intermittently for the last few years. She reports a having to catch her breath for a second with the palpitations. She also has intermittent sharp, pressure/pain that starts under her left axilla and extends to the center of her chest. The occurs a few days a week for hours at a time and not necessarily in relation to palpitations. She has had a normal EKG in the past. She was supposed to have an echo done prior to her move. Denies precipitating or relieving factors for pain or palpitations. Denies dizziness, edema, orthopnea, visual disturbances, nausea, focal weakness, or diaphoresis. Her father did have a congenital heart murmur and a MI prior to the age of 56.  She started on ozempic 1 mg last week. Denies side effects.      09/06/2021    1:41 PM 08/08/2021   11:33 AM 10/17/2020    2:25 PM  Depression screen PHQ 2/9  Decreased Interest 0 0 0  Down, Depressed, Hopeless 0 1 0  PHQ - 2 Score 0 1 0  Altered sleeping 1 2   Tired, decreased energy 1 2   Change in appetite 1 0   Feeling bad or failure about yourself  0 0   Trouble concentrating 0 0   Moving slowly or fidgety/restless 0 0   Suicidal thoughts 0 0   PHQ-9 Score 3 5   Difficult doing work/chores Not difficult at all Not difficult at all       09/06/2021    1:41 PM 08/08/2021   11:33 AM  GAD 7 :  Generalized Anxiety Score  Nervous, Anxious, on Edge 0 1  Control/stop worrying 0 0  Worry too much - different things 0 1  Trouble relaxing 0 1  Restless 0 0  Easily annoyed or irritable 1 1  Afraid - awful might happen 0 0  Total GAD 7 Score 1 4  Anxiety Difficulty Not difficult at all Not difficult at all     Past Medical History:  Diagnosis Date   Anemia    Anxiety    Childhood asthma    Depression    Diabetes mellitus without complication (HCC)     Review of Systems  Psychiatric/Behavioral:  Positive for depression.    As per HPI.   Objective:     BP (!) 148/89   Pulse (!) 104   Temp (!) 97.5 F (36.4 C) (Temporal)   Resp 20   Ht 5\' 4"  (1.626 m)   Wt 277 lb (125.6 kg)   SpO2 92%   BMI 47.55 kg/m  Wt Readings from Last 3 Encounters:  09/06/21 277 lb (125.6 kg)  08/08/21 273 lb 2 oz (123.9 kg)  06/18/21 274 lb 14.4 oz (124.7 kg)      Physical Exam Vitals and nursing note reviewed.  Constitutional:      General: She is not in acute distress.    Appearance: Normal appearance. She is not ill-appearing, toxic-appearing or diaphoretic.  HENT:     Head: Normocephalic and atraumatic.  Eyes:     Pupils: Pupils are equal, round, and reactive to light.  Neck:     Vascular: No carotid bruit.  Cardiovascular:     Rate and Rhythm: Normal rate and regular rhythm.     Heart sounds: Normal heart sounds. No murmur heard. Pulmonary:     Effort: Pulmonary effort is normal. No respiratory distress.     Breath sounds: Normal breath sounds. No stridor. No wheezing, rhonchi or rales.  Chest:     Chest wall: No tenderness.  Abdominal:     General: Bowel sounds are normal.     Palpations: Abdomen is soft.  Musculoskeletal:     Cervical back: Neck supple.     Right lower leg: No edema.     Left lower leg: No edema.  Skin:    General: Skin is warm and dry.  Neurological:     General: No focal deficit present.     Mental Status: She is alert and oriented to person,  place, and time.  Psychiatric:        Mood and Affect: Mood normal.        Behavior: Behavior normal.      No results found for any visits on 09/06/21.    The ASCVD Risk score (Arnett DK, et al., 2019) failed to calculate for the following reasons:   The 2019 ASCVD risk score is only valid for ages 14 to 61    Assessment & Plan:   Allison Friedman was seen today for depression.  Diagnoses and all orders for this visit:  Depression, recurrent (HCC) Generalized anxiety disorder Well controlled on current regimen. Referral to psychiatry for further evaluation related to possible BPD. -     Ambulatory referral to Psychiatry  Palpitations Chest pain, unspecified type Atypical chest pain. Zio placed today for 14 day wear. Will referral to cardiology given family hx of early MI.  -     LONG TERM MONITOR (3-14 DAYS); Future -     Ambulatory referral to Cardiology  Type 2 diabetes mellitus with hyperglycemia, without long-term current use of insulin (HCC) Well controlled on current regimen. Last a1c at goal. Continue on ozempic 1 mg weekly.  Morbid obesity (HCC) Continue ozempic 1 mg weekly. Continue working on diet and exercise.    Return in about 4 weeks (around 10/04/2021) for follow up.   The patient indicates understanding of these issues and agrees with the plan.  Gabriel Earing, FNP

## 2021-09-26 ENCOUNTER — Ambulatory Visit: Payer: BC Managed Care – PPO | Admitting: Cardiology

## 2021-09-30 DIAGNOSIS — R1013 Epigastric pain: Secondary | ICD-10-CM | POA: Diagnosis not present

## 2021-09-30 DIAGNOSIS — R748 Abnormal levels of other serum enzymes: Secondary | ICD-10-CM | POA: Diagnosis not present

## 2021-09-30 DIAGNOSIS — K760 Fatty (change of) liver, not elsewhere classified: Secondary | ICD-10-CM | POA: Diagnosis not present

## 2021-09-30 DIAGNOSIS — N3001 Acute cystitis with hematuria: Secondary | ICD-10-CM | POA: Diagnosis not present

## 2021-09-30 DIAGNOSIS — R110 Nausea: Secondary | ICD-10-CM | POA: Diagnosis not present

## 2021-09-30 DIAGNOSIS — R109 Unspecified abdominal pain: Secondary | ICD-10-CM | POA: Diagnosis not present

## 2021-09-30 DIAGNOSIS — E119 Type 2 diabetes mellitus without complications: Secondary | ICD-10-CM | POA: Diagnosis not present

## 2021-09-30 DIAGNOSIS — R1010 Upper abdominal pain, unspecified: Secondary | ICD-10-CM | POA: Diagnosis not present

## 2021-10-07 ENCOUNTER — Ambulatory Visit: Payer: BC Managed Care – PPO | Admitting: Family Medicine

## 2021-10-07 ENCOUNTER — Encounter: Payer: Self-pay | Admitting: Family Medicine

## 2021-10-07 VITALS — BP 110/69 | HR 84 | Temp 98.0°F | Ht 64.0 in | Wt 279.2 lb

## 2021-10-07 DIAGNOSIS — R1013 Epigastric pain: Secondary | ICD-10-CM | POA: Diagnosis not present

## 2021-10-07 DIAGNOSIS — F411 Generalized anxiety disorder: Secondary | ICD-10-CM | POA: Diagnosis not present

## 2021-10-07 DIAGNOSIS — R748 Abnormal levels of other serum enzymes: Secondary | ICD-10-CM | POA: Diagnosis not present

## 2021-10-07 DIAGNOSIS — F339 Major depressive disorder, recurrent, unspecified: Secondary | ICD-10-CM

## 2021-10-07 DIAGNOSIS — K760 Fatty (change of) liver, not elsewhere classified: Secondary | ICD-10-CM

## 2021-10-07 MED ORDER — DESVENLAFAXINE SUCCINATE ER 50 MG PO TB24: 50.0000 mg | ORAL_TABLET | Freq: Every day | ORAL | 0 refills | Status: DC

## 2021-10-07 NOTE — Patient Instructions (Addendum)
[facility]. [place], [address] - [address] [address] [phone]  Food Choices for Gastroesophageal Reflux Disease, Adult When you have gastroesophageal reflux disease (GERD), the foods you eat and your eating habits are very important. Choosing the right foods can help ease the discomfort of GERD. Consider working with a dietitian to help you make healthy food choices. What are tips for following this plan? Reading food labels Look for foods that are low in saturated fat. Foods that have less than 5% of daily value (DV) of fat and 0 g of trans fats may help with your symptoms. Cooking Cook foods using methods other than frying. This may include baking, steaming, grilling, or broiling. These are all methods that do not need a lot of fat for cooking. To add flavor, try to use herbs that are low in spice and acidity. Meal planning  Choose healthy foods that are low in fat, such as fruits, vegetables, whole grains, low-fat dairy products, lean meats, fish, and poultry. Eat frequent, small meals instead of three large meals each day. Eat your meals slowly, in a relaxed setting. Avoid bending over or lying down until 2-3 hours after eating. Limit high-fat foods such as fatty meats or fried foods. Limit your intake of fatty foods, such as oils, butter, and shortening. Avoid the following as told by your health care provider: Foods that cause symptoms. These may be different for different people. Keep a food diary to keep track of foods that cause symptoms. Alcohol. Drinking large amounts of liquid with meals. Eating meals during the 2-3 hours before bed. Lifestyle Maintain a healthy weight. Ask your health care provider what weight is healthy for you. If you need to lose weight, work with your health care provider to do so safely. Exercise for at least 30 minutes on 5 or more days each week, or as told by your health care provider. Avoid wearing  clothes that fit tightly around your waist and chest. Do not use any products that contain nicotine or tobacco. These products include cigarettes, chewing tobacco, and vaping devices, such as e-cigarettes. If you need help quitting, ask your health care provider. Sleep with the head of your bed raised. Use a wedge under the mattress or blocks under the bed frame to raise the head of the bed. Chew sugar-free gum after mealtimes. What foods should I eat?  Eat a healthy, well-balanced diet of fruits, vegetables, whole grains, low-fat dairy products, lean meats, fish, and poultry. Each person is different. Foods that may trigger symptoms in one person may not trigger any symptoms in another person. Work with your health care provider to identify foods that are safe for you. The items listed above may not be a complete list of recommended foods and beverages. Contact a dietitian for more information. What foods should I avoid? Limiting some of these foods may help manage the symptoms of GERD. Everyone is different. Consult a dietitian or your health care provider to help you identify the exact foods to avoid, if any. Fruits Any fruits prepared with added fat. Any fruits that cause symptoms. For some people this may include citrus fruits, such as oranges, grapefruit, pineapple, and lemons. Vegetables Deep-fried vegetables. [place] fries. Any vegetables prepared with added fat. Any vegetables that cause symptoms. For some people, this may include tomatoes and tomato products, chili peppers, onions and garlic, and horseradish. Grains Pastries or quick breads with added fat. Meats and other proteins High-fat meats, such  as fatty beef or pork, hot dogs, ribs, ham, sausage, salami, and bacon. Fried meat or protein, including fried fish and fried chicken. Nuts and nut butters, in large amounts. Dairy Whole milk and chocolate milk. Sour cream. Cream. Ice cream. Cream cheese. Milkshakes. Fats and oils Butter.  Margarine. Shortening. Ghee. Beverages Coffee and tea, with or without caffeine. Carbonated beverages. Sodas. Energy drinks. Fruit juice made with acidic fruits, such as orange or grapefruit. Tomato juice. Alcoholic drinks. Sweets and desserts Chocolate and cocoa. Donuts. Seasonings and condiments Pepper. Peppermint and spearmint. Added salt. Any condiments, herbs, or seasonings that cause symptoms. For some people, this may include curry, hot sauce, or vinegar-based salad dressings. The items listed above may not be a complete list of foods and beverages to avoid. Contact a dietitian for more information. Questions to ask your health care provider Diet and lifestyle changes are usually the first steps that are taken to manage symptoms of GERD. If diet and lifestyle changes do not improve your symptoms, talk with your health care provider about taking medicines. Where to find more information International Foundation for Gastrointestinal Disorders: aboutgerd.org Summary When you have gastroesophageal reflux disease (GERD), food and lifestyle choices may be very helpful in easing the discomfort of GERD. Eat frequent, small meals instead of three large meals each day. Eat your meals slowly, in a relaxed setting. Avoid bending over or lying down until 2-3 hours after eating. Limit high-fat foods such as fatty meats or fried foods. This information is not intended to replace advice given to you by your health care provider. Make sure you discuss any questions you have with your health care provider. Document Revised: 08/22/2019 Document Reviewed: 08/22/2019 Elsevier Patient Education  2023 [employer].

## 2021-10-07 NOTE — Progress Notes (Signed)
   Established Patient Office Visit  Subjective   Patient ID: Allison Friedman, female    DOB: 26-Oct-1995  Age: 26 y.o. MRN: [id number]  Chief Complaint  Patient presents with  . Depression    HPI  She has been taking Celexa but feels like this has actually made her symptoms worse. She feels more irrtiable, fatigued,   She continues to have abdominal pain in the epigastric region for the last 10 days. The pain is intermittent and occurs every other day roughly. The pain is severe when it occurs. It lasts for up to a few hours. She also has a lot of belching.   She went to the ER on 8/7 with nausea, vomting. Since then the vomiting and nasuea has resolved     10/07/2021    9:50 AM 09/06/2021    1:41 PM 08/08/2021   11:33 AM  Depression screen PHQ 2/9  Decreased Interest 0 0 0  Down, Depressed, Hopeless 1 0 1  PHQ - 2 Score 1 0 1  Altered sleeping 1 1 2   Tired, decreased energy 1 1 2   Change in appetite 0 1 0  Feeling bad or failure about yourself  0 0 0  Trouble concentrating 0 0 0  Moving slowly or fidgety/restless 0 0 0  Suicidal thoughts 0 0 0  PHQ-9 Score 3 3 5   Difficult doing work/chores Not difficult at all Not difficult at all Not difficult at all      10/07/2021    9:51 AM 09/06/2021    1:41 PM 08/08/2021   11:33 AM  GAD 7 : Generalized Anxiety Score  Nervous, Anxious, on Edge 0 0 1  Control/stop worrying 0 0 0  Worry too much - different things 0 0 1  Trouble relaxing 0 0 1  Restless 0 0 0  Easily annoyed or irritable 1 1 1   Afraid - awful might happen 0 0 0  Total GAD 7 Score 1 1 4   Anxiety Difficulty Not difficult at all Not difficult at all Not difficult at all      Past Medical History:  Diagnosis Date  . Anemia   . Anxiety   . Childhood asthma   . Depression   . Diabetes mellitus without complication (HCC)   . Palpitations       ROS    Objective:     BP 110/69   Pulse 84   Temp 98 F (36.7 C) (Temporal)   Ht 5\' 4"  (1.626 m)   Wt 279  lb 4 oz (126.7 kg)   SpO2 96%   BMI 47.93 kg/m  {Vitals History (Optional):23777}  Physical Exam   No results found for any visits on 10/07/21.  {Labs (Optional):23779}  The ASCVD Risk score (Arnett DK, et al., 2019) failed to calculate for the following reasons:   The 2019 ASCVD risk score is only valid for ages 39 to 67    Assessment & Plan:   Problem List Items Addressed This Visit   None   No follow-ups on file.    , FNP

## 2021-10-08 ENCOUNTER — Encounter: Payer: Self-pay | Admitting: *Deleted

## 2021-10-08 LAB — LIPASE: Lipase: 73 U/L — ABNORMAL HIGH (ref 14–72)

## 2021-10-25 ENCOUNTER — Ambulatory Visit (INDEPENDENT_AMBULATORY_CARE_PROVIDER_SITE_OTHER): Payer: BC Managed Care – PPO | Admitting: Gastroenterology

## 2021-10-25 ENCOUNTER — Encounter: Payer: Self-pay | Admitting: Gastroenterology

## 2021-10-25 ENCOUNTER — Encounter: Payer: Self-pay | Admitting: *Deleted

## 2021-10-25 VITALS — BP 120/80 | HR 81 | Temp 97.7°F | Ht 64.0 in | Wt 280.4 lb

## 2021-10-25 DIAGNOSIS — R748 Abnormal levels of other serum enzymes: Secondary | ICD-10-CM | POA: Insufficient documentation

## 2021-10-25 DIAGNOSIS — R1319 Other dysphagia: Secondary | ICD-10-CM

## 2021-10-25 DIAGNOSIS — K219 Gastro-esophageal reflux disease without esophagitis: Secondary | ICD-10-CM

## 2021-10-25 DIAGNOSIS — R1013 Epigastric pain: Secondary | ICD-10-CM | POA: Insufficient documentation

## 2021-10-25 DIAGNOSIS — K5904 Chronic idiopathic constipation: Secondary | ICD-10-CM | POA: Diagnosis not present

## 2021-10-25 DIAGNOSIS — K760 Fatty (change of) liver, not elsewhere classified: Secondary | ICD-10-CM | POA: Insufficient documentation

## 2021-10-25 DIAGNOSIS — R1310 Dysphagia, unspecified: Secondary | ICD-10-CM | POA: Insufficient documentation

## 2021-10-25 DIAGNOSIS — K5900 Constipation, unspecified: Secondary | ICD-10-CM | POA: Insufficient documentation

## 2021-10-25 HISTORY — DX: Abnormal levels of other serum enzymes: R74.8

## 2021-10-25 MED ORDER — LINACLOTIDE 145 MCG PO CAPS: 145.0000 ug | ORAL_CAPSULE | Freq: Every day | ORAL | 3 refills | Status: DC

## 2021-10-25 NOTE — Patient Instructions (Addendum)
Continue pantoprazole daily. Continue sulcralfate four times daily as needed. Linzess daily for constipation. Samples provided. If too strong or not effective please let me know. I am sending in RX as well. Upper endoscopy to be scheduled. See separate instructions.  Please limit Advil, Aleve. Tylenol okay per instructions on the bottle.  For fatty liver, please work at eating healthy diet consisting of fruits/vegetables/lean meat. Decrease sweets especially drinks sweetened with sugar. Starting walking 20-30 minutes every day.  Return to the office in 4 months.   Fatty Liver Disease  The liver converts food into energy, removes toxic material from the blood, makes important proteins, and absorbs necessary vitamins from food. Fatty liver disease occurs when too much fat has built up in your liver cells. Fatty liver disease is also called hepatic steatosis. In many cases, fatty liver disease does not cause symptoms or problems. It is often diagnosed when tests are being done for other reasons. However, over time, fatty liver can cause inflammation that may lead to more serious liver problems, such as scarring of the liver (cirrhosis) and liver failure. Fatty liver is associated with insulin resistance, increased body fat, high blood pressure (hypertension), and high cholesterol. These are features of metabolic syndrome and increase your risk for stroke, diabetes, and heart disease. What are the causes? This condition may be caused by components of metabolic syndrome: Obesity. Insulin resistance. High cholesterol. Other causes: Alcohol abuse. Poor nutrition. Cushing syndrome. Pregnancy. Certain drugs. Poisons. Some viral infections. What increases the risk? You are more likely to develop this condition if you: Abuse alcohol. Are overweight. Have diabetes. Have hepatitis. Have a high triglyceride level. Are pregnant. What are the signs or symptoms? Fatty liver disease often  does not cause symptoms. If symptoms do develop, they can include: Fatigue and weakness. Weight loss. Confusion. Nausea, vomiting, or abdominal pain. Yellowing of your skin and the white parts of your eyes (jaundice). Itchy skin. How is this diagnosed? This condition may be diagnosed by: A physical exam and your medical history. Blood tests. Imaging tests, such as an ultrasound, CT scan, or MRI. A liver biopsy. A small sample of liver tissue is removed using a needle. The sample is then looked at under a microscope. How is this treated? Fatty liver disease is often caused by other health conditions. Treatment for fatty liver may involve medicines and lifestyle changes to manage conditions such as: Alcoholism. High cholesterol. Diabetes. Being overweight or obese. Follow these instructions at home:  Do not drink alcohol. If you have trouble quitting, ask your health care provider how to safely quit with the help of medicine or a supervised program. This is important to keep your condition from getting worse. Eat a healthy diet as told by your health care provider. Ask your health care provider about working with a dietitian to develop an eating plan. Exercise regularly. This can help you lose weight and control your cholesterol and diabetes. Talk to your health care provider about an exercise plan and which activities are best for you. Take over-the-counter and prescription medicines only as told by your health care provider. Keep all follow-up visits. This is important. Contact a health care provider if: You have trouble controlling your: Blood sugar. This is especially important if you have diabetes. Cholesterol. Drinking of alcohol. Get help right away if: You have abdominal pain. You have jaundice. You have nausea and are vomiting. You vomit blood or material that looks like coffee grounds. You have stools that are black,  tar-like, or bloody. Summary Fatty liver disease  develops when too much fat builds up in the cells of your liver. Fatty liver disease often causes no symptoms or problems. However, over time, fatty liver can cause inflammation that may lead to more serious liver problems, such as scarring of the liver (cirrhosis). You are more likely to develop this condition if you abuse alcohol, are pregnant, are overweight, have diabetes, have hepatitis, or have high triglyceride or cholesterol levels. Contact your health care provider if you have trouble controlling your blood sugar, cholesterol, or drinking of alcohol. This information is not intended to replace advice given to you by your health care provider. Make sure you discuss any questions you have with your health care provider. Document Revised: 11/24/2019 Document Reviewed: 11/24/2019 Elsevier Patient Education  2023 Elsevier Inc.  Nonalcoholic Fatty Liver Disease Diet, Adult Nonalcoholic fatty liver disease is a condition that causes fat to build up in and around the liver. The disease makes it harder for the liver to work the way that it should. Following a healthy diet can help to keep nonalcoholic fatty liver disease under control. It can also help to prevent or improve conditions that are associated with the disease, such as heart disease, diabetes, high blood pressure, and abnormal cholesterol levels. Along with regular exercise, this diet: Promotes weight loss. Helps to control blood sugar levels. Helps to improve the way that the body uses insulin. What are tips for following this plan? Reading food labels Always check food labels for: The amount of saturated fat in a food. You should limit your intake of saturated fat. Saturated fat is found in foods that come from animals, including meat and dairy products such as butter, cheese, and whole milk. The amount of fiber in a food. You should choose high-fiber foods such as fruits, vegetables, and whole grains. Try to get 25-30 grams (g) of  fiber a day.  Cooking When cooking, use heart-healthy oils that are high in monounsaturated fats. These include olive oil, canola oil, and avocado oil. Limit frying or deep-frying foods. Cook foods using healthy methods such as baking, boiling, steaming, and grilling instead. Meal planning You may want to keep track of how many calories you take in. Eating the right amount of calories will help you achieve a healthy weight. Meeting with a registered dietitian can help you get started. Limit how often you eat takeout and fast food. These foods are usually very high in fat, salt, and sugar. Use the glycemic index (GI) to plan your meals. The index tells you how quickly a food will raise your blood sugar. Choose low-GI foods (GI less than 55). These foods take a longer time to raise blood sugar. A registered dietitian can help you identify foods lower on the GI scale. Lifestyle You may want to follow a Mediterranean diet. This diet includes a lot of vegetables, lean meats or fish, whole grains, fruits, and healthy oils and fats. What foods can I eat?  Fruits Bananas. Apples. Oranges. Grapes. Papaya. Mango. Pomegranate. Kiwi. Grapefruit. Cherries. Vegetables Lettuce. Spinach. Peas. Beets. Cauliflower. Cabbage. Broccoli. Carrots. Tomatoes. Squash. Eggplant. Herbs. Peppers. Onions. Cucumbers. Brussels sprouts. Yams and sweet potatoes. Beans. Lentils. Grains Whole wheat or whole-grain foods, including breads, crackers, cereals, and pasta. Stone-ground whole wheat. Unsweetened oatmeal. Bulgur. Barley. Quinoa. Brown or wild rice. Corn or whole wheat flour tortillas. Meats and other proteins Lean meats. Poultry. Tofu. Seafood and shellfish. Dairy Low-fat or fat-free dairy products, such as yogurt, cottage cheese,  or cheese. Beverages Water. Sugar-free drinks. Tea. Coffee. Low-fat or skim milk. Milk alternatives, such as soy or almond milk. Real fruit juice. Fats and oils Avocado. Canola or olive oil.  Nuts and nut butters. Seeds. Seasonings and condiments Mustard. Relish. Low-fat, low-sugar ketchup and barbecue sauce. Low-fat or fat-free mayonnaise. Sweets and desserts Sugar-free sweets. The items listed above may not be a complete list of foods and beverages you can eat. Contact a dietitian for more information. What foods should I limit or avoid? Meats and other proteins Limit red meat to 1-2 times a week. Dairy [employer]. Fats and oils Palm oil and coconut oil. Fried foods. Other foods Processed foods. Foods that contain a lot of salt or sodium. Sweets and desserts Sweets that contain sugar. Beverages Sweetened drinks, such as sweet tea, milkshakes, iced sweet drinks, and sodas. Alcohol. The items listed above may not be a complete list of foods and beverages you should avoid. Contact a dietitian for more information. Where to find more information The [employer] of Diabetes and Digestive and Kidney Diseases: [email or web address] Summary Nonalcoholic fatty liver disease is a condition that causes fat to build up in and around the liver. Following a healthy diet can help to keep nonalcoholic fatty liver disease under control. Your diet should be rich in fruits, vegetables, whole grains, and lean proteins. Limit your intake of saturated fat. Saturated fat is found in foods that come from animals, including meat and dairy products such as butter, cheese, and whole milk. This diet promotes weight loss, helps to control blood sugar levels, and helps to improve the way that the body uses insulin. This information is not intended to replace advice given to you by your health care provider. Make sure you discuss any questions you have with your health care provider. Document Revised: 06/04/2018 Document Reviewed: 03/04/2018 Elsevier Patient Education  2023 [employer].

## 2021-10-25 NOTE — Progress Notes (Signed)
GI Office Note    Referring Provider: Gabriel Earing, FNP Primary Care Physician:  Gabriel Earing, FNP  Primary Gastroenterologist: Hennie Duos. Marletta Lor, DO   Chief Complaint   Chief Complaint  Patient presents with   Constipation    Currently does not take anything for it. History of obstructions. Last bm was two to three days ago.   belching    Has issues with belching.      History of Present Illness   Allison Friedman is a 26 y.o. female with medical history significant for anxiety/depression, diabetes, obesity presenting today at the request of Harlow Mares, FNP for further evaluation of elevated lipase, epigastric pain, fatty liver.   Patient states she was prediabetic before her pregnancy. She delivered her daughter on 05/08/21. Reports her sugar was an issue during pregnancy and got better short term after delivery. She states however now she has type 2 DM and was started on Ozempic. She started at 0.5 and is not on 1mg . She notes she is at her heaviest weight except during pregnancy. At her lowest adult weight she was 210 pounds. She struggled with post partum depression. She has been making positive lifestyle changes, wanting to take better care of her self.   She recently noted GI issues. Does not think it is related to ozempic. Occurred a couple of weeks after she increased to 1mg  dosing.  Recently had noted excessive belching, not usual symptoms for her.  Regurgitated oily liquid a few times.  Woke up 1 morning recently and had vague abdominal pain.  While at church she developed acute onset epigastric pain associated with sweats, fatigue.  She went home and took a nap.  When she woke up she felt better except for continuous burping.  The following day she developed recurrent sharp abdominal pain and frequent vomiting.  Seen in the ED.  She was given GI cocktail which helped a lot with the pain.  Work-up included labs and right upper quadrant ultrasound as outlined  below.  She was started on pantoprazole daily and sucralfate 4 times daily.  She denies any further vomiting or abdominal pain.  She continues to have frequent burping, does not matter what she eats.  Chronically has had some reflux symptoms, during pregnancy acid will come up in through her nose at times.  Complains of solid food dysphagia.  Currently no heartburn on PPI.  Chronically with issues of constipation but seems to be worse over the past 5 years.  Use a stool softener or over-the-counter stimulants regularly.  Denies any melena or rectal bleeding.  Describes having 2 episodes of fecal impaction, once had to be seen in the ED for disimpaction.  This occurred while pregnant.  She stopped NSAID use recently with onset of GI symptoms.  While in the ED at [facility] on September 30, 2021  Lipase minimally elevated at 141.  Urine positive for protein, blood, bacteria, started on Keflex.  Urine pregnancy test negative. White blood cell count 9900, hemoglobin 14.1, platelets 268,000, sodium 140, potassium 4.7, BUN 13, creatinine 0.93, total bilirubin 0.2, alkaline phosphatase 93, AST 25, ALT 47,  Upper quadrant ultrasound September 30, 2021: IMPRESSION:  1. The echogenicity of the liver is mildly increased. This is a  nonspecific finding but is most commonly seen with fatty  infiltration of the liver. There are no obvious focal liver lesions.  2. Unremarkable gallbladder.     March 2023:294 pounds. June 2023: 273 pounds Today: 280  pounds   Medications   Current Outpatient Medications  Medication Sig Dispense Refill   atorvastatin (LIPITOR) 20 MG tablet Take 1 tablet (20 mg total) by mouth daily. 90 tablet 3   Continuous Blood Gluc Sensor (FREESTYLE LIBRE 2 SENSOR) MISC Use to test blood sugar continuously. Apply sensor to arm every 14 days.  DX: E11.65 2 each 5   desvenlafaxine (PRISTIQ) 50 MG 24 hr tablet Take 1 tablet (50 mg total) by mouth daily. 90 tablet 0   pantoprazole (PROTONIX) 20  MG tablet Take 40 mg by mouth daily.     PARAGARD INTRAUTERINE COPPER IU by Intrauterine route.     Semaglutide, 1 MG/DOSE, 4 MG/3ML SOPN Inject 1 mg as directed once a week. DX: E11.65 3 mL 5   sucralfate (CARAFATE) 1 g tablet Take 1 g by mouth 4 (four) times daily.     No current facility-administered medications for this visit.    Allergies   Allergies as of 10/25/2021 - Review Complete 10/25/2021  Allergen Reaction Noted   Metformin and related Diarrhea 08/29/2021   Prozac [fluoxetine] Other (See Comments) 08/29/2021   Xanax [alprazolam] Other (See Comments) 08/29/2021    Past Medical History   Past Medical History:  Diagnosis Date   Anemia    Anxiety    Childhood asthma    Depression    Diabetes mellitus without complication ([place])    Palpitations     Past Surgical History   Past Surgical History:  Procedure Laterality Date   CESAREAN SECTION N/A 05/08/2021   Procedure: CESAREAN SECTION;  Surgeon: Armandina Stammer, DO;  Location: [facility] LD ORS;  Service: Obstetrics;  Laterality: N/A;   CYST REMOVAL NECK     infected sweat gland - cyst removal on the chest   NECK SURGERY      Past Family History   Family History  Problem Relation Age of Onset   Depression Mother    Arthritis Mother    Anxiety disorder Mother    Diabetes Mother    Hypertension Mother    Diabetes Father    Alcohol abuse Father    Hypertension Father    Congenital Murmur Father    Heart attack Father        late 25's or early 67's   Hearing loss Brother        born deaf   Asthma Brother    Colon cancer Maternal Grandfather     Past Social History   Social History   Socioeconomic History   Marital status: Married    Spouse name: Quillian Quince   Number of children: 1   Years of education: 12   Highest education level: High school graduate  Occupational History   Not on file  Tobacco Use   Smoking status: Never   Smokeless tobacco: Never  Vaping Use   Vaping Use: Never used  Substance  and Sexual Activity   Alcohol use: Never   Drug use: Never   Sexual activity: Yes    Birth control/protection: I.U.D.    Comment: Paraguard  Other Topics Concern   Not on file  Social History Narrative   Not on file   Social Determinants of Health   Financial Resource Strain: Not on file  Food Insecurity: Not on file  Transportation Needs: Not on file  Physical Activity: Not on file  Stress: Not on file  Social Connections: Not on file  Intimate Partner Violence: Not on file    Review of Systems  General: Negative for anorexia, weight loss, fever, chills, fatigue, weakness. Eyes: Negative for vision changes.  ENT: Negative for hoarseness,  nasal congestion. See hpi CV: Negative for chest pain, angina, palpitations, dyspnea on exertion, peripheral edema.  Respiratory: Negative for dyspnea at rest, dyspnea on exertion, cough, sputum, wheezing.  GI: See history of present illness. GU:  Negative for dysuria, hematuria, urinary incontinence, urinary frequency, nocturnal urination.  MS: Negative for joint pain, low back pain.  Derm: Negative for rash or itching.  Neuro: Negative for weakness, abnormal sensation, seizure, frequent headaches, memory loss,  confusion.  Psych: Negative for suicidal ideation, hallucinations. H/o anxiety/depression Endo: Negative for unusual weight change.  Heme: Negative for bruising or bleeding. Allergy: Negative for rash or hives.  Physical Exam   BP 120/80 (BP Location: Right Arm, Patient Position: Sitting, Cuff Size: Large)   Pulse 81   Temp 97.7 F (36.5 C) (Temporal)   Ht 5\' 4"  (1.626 m)   Wt 280 lb 6.4 oz (127.2 kg)   LMP  (LMP Unknown) Comment: IUD  SpO2 97%   BMI 48.13 kg/m    General: Pleasant, Well-nourished, well-developed in no acute distress.  Head: Normocephalic, atraumatic.   Eyes: Conjunctiva pink, no icterus. Mouth: Oropharyngeal mucosa moist and pink , no lesions erythema or exudate. Neck: Supple without thyromegaly,  masses, or lymphadenopathy.  Lungs: Clear to auscultation bilaterally.  Heart: Regular rate and rhythm, no murmurs rubs or gallops.  Abdomen: Bowel sounds are normal, nontender, nondistended, no hepatosplenomegaly or masses,  no abdominal bruits or hernia, no rebound or guarding.   Rectal: not performed Extremities: No lower extremity edema. No clubbing or deformities.  Neuro: Alert and oriented x 4 , grossly normal neurologically.  Skin: Warm and dry, no rash or jaundice.   Psych: Alert and cooperative, normal mood and affect.  Labs   See hpi  Imaging Studies   LONG TERM MONITOR (3-14 DAYS)  Result Date: 10/02/2021 No significant arrhthymias noted. Patch Wear Time:  7 days and 0 hours (2023-07-14T14:18:36-0400 to 2023-07-21T14:43:18-0400) Patient had a min HR of 57 bpm, max HR of 155 bpm, and avg HR of 84 bpm. Predominant underlying rhythm was Sinus Rhythm. Isolated SVEs were rare (<1.0%), and no SVE Couplets or SVE Triplets were present. Isolated VEs were rare (<1.0%, 45), VE Triplets were rare (<1.0%, 1), and no VE Couplets were present.    Assessment   Epigastric pain/GERD/Dysphagia: Chronic intermittent typical reflux symptoms.  Describes intermittent solid food dysphagia more chronically.  Recently with frequent belching, some regurgitation.  Acute onset epigastric pain with vomiting prompting ED evaluation, now resolved.  Symptoms could be secondary to gastritis or peptic ulcer disease in the setting of NSAID use.  Also could be secondary to reflux and delayed gastric emptying in the setting of Ozempic.  Biliary etiology remains differential although felt to be less likely given normal gallbladder, no stones visualized.  An upper endoscopy for further evaluation, to rule out gastritis, peptic ulcer disease, hiatal hernia, complicated GERD.  Consider esophageal dilation at the same time for history of dysphagia.  Elevated lipase: Somewhat nonspecific with mild elevation.  Clinical  presentation not consistent with acute pancreatitis.  With recurrent abdominal pain would be reasonable to recheck LFTs and lipase to help exclude biliary etiology.  Monitor for any signs of pancreatitis, especially given that she recently started Ozempic which can be associated with pancreatitis.  Fatty liver: Recent LFTs normal.  Recommend tight glycemic control.  Low-fat diet.  Increase daily activity.  Strive for weight loss.  Constipation: Poorly controlled.  History of fecal impaction per patient report.  Previously failed MiraLAX and over-the-counter stool softeners.   PLAN   Continue pantoprazole daily. Continue sucralfate 4 times daily as needed. Linzess 145 mcg daily for constipation. Limit Advil, Aleve is much as possible. Tight glycemic control, healthy low-fat diet, increase dietary fiber.  Walk 20 to 30 minutes every day.  Strive for slow gradual weight loss. Upper endoscopy with Dr. Marletta Lor.  ASA 3.  I have discussed the risks, alternatives, benefits with regards to but not limited to the risk of reaction to medication, bleeding, infection, perforation and the patient is agreeable to proceed. Written consent to be obtained. Return to the office in 4 months for follow-up.   Leanna Battles. Melvyn Neth, MHS, PA-C [name]

## 2021-10-30 ENCOUNTER — Encounter: Payer: Self-pay | Admitting: *Deleted

## 2021-11-13 ENCOUNTER — Encounter: Payer: Self-pay | Admitting: Family Medicine

## 2021-11-13 ENCOUNTER — Ambulatory Visit (INDEPENDENT_AMBULATORY_CARE_PROVIDER_SITE_OTHER): Payer: BC Managed Care – PPO | Admitting: Family Medicine

## 2021-11-13 VITALS — BP 97/69 | HR 89 | Temp 99.0°F | Ht 64.0 in | Wt 279.4 lb

## 2021-11-13 DIAGNOSIS — K760 Fatty (change of) liver, not elsewhere classified: Secondary | ICD-10-CM

## 2021-11-13 DIAGNOSIS — K219 Gastro-esophageal reflux disease without esophagitis: Secondary | ICD-10-CM

## 2021-11-13 DIAGNOSIS — Z23 Encounter for immunization: Secondary | ICD-10-CM | POA: Diagnosis not present

## 2021-11-13 DIAGNOSIS — F339 Major depressive disorder, recurrent, unspecified: Secondary | ICD-10-CM | POA: Diagnosis not present

## 2021-11-13 DIAGNOSIS — E1169 Type 2 diabetes mellitus with other specified complication: Secondary | ICD-10-CM

## 2021-11-13 DIAGNOSIS — E1165 Type 2 diabetes mellitus with hyperglycemia: Secondary | ICD-10-CM | POA: Diagnosis not present

## 2021-11-13 DIAGNOSIS — F411 Generalized anxiety disorder: Secondary | ICD-10-CM

## 2021-11-13 DIAGNOSIS — E785 Hyperlipidemia, unspecified: Secondary | ICD-10-CM

## 2021-11-13 LAB — BAYER DCA HB A1C WAIVED: HB A1C (BAYER DCA - WAIVED): 6.1 % — ABNORMAL HIGH (ref 4.8–5.6)

## 2021-11-13 MED ORDER — PANTOPRAZOLE SODIUM 20 MG PO TBEC: 20.0000 mg | DELAYED_RELEASE_TABLET | Freq: Two times a day (BID) | ORAL | 3 refills | Status: DC

## 2021-11-13 MED ORDER — DESVENLAFAXINE SUCCINATE ER 100 MG PO TB24: 100.0000 mg | ORAL_TABLET | Freq: Every day | ORAL | 1 refills | Status: DC

## 2021-11-13 MED ORDER — SEMAGLUTIDE (2 MG/DOSE) 8 MG/3ML ~~LOC~~ SOPN: 2.0000 mg | PEN_INJECTOR | SUBCUTANEOUS | 3 refills | Status: DC

## 2021-11-13 NOTE — Progress Notes (Signed)
Established Patient Office Visit  Subjective   Patient ID: Allison Friedman, female    DOB: 1995/11/19  Age: 26 y.o. MRN: [id number]  Chief Complaint  Patient presents with   Medical Management of Chronic Issues    HPI Blood sugars have been well controlled. She has been trying to track her calories but this has been difficult for her to accomplish. She has been trying to go to the gym to exercise. Exercise is limited by childcare difficulties. Her weight has been stable. She has been on ozempic 1 mg for over 1 month now without any additional weight loss.   She has been seen by GI. She reports that GERD symptoms and abdominal pain has resolved with protonix BID and carafate. She has an EGD scheduled at the end of this month.   Has noticed some improvement with Pristic. She continues to be easily irritable and having periods of sadness. Denies SI. She has tried to call psychiatry to schedule an appt but they have not returned her call.      11/13/2021   10:35 AM 10/07/2021    9:50 AM 09/06/2021    1:41 PM  Depression screen PHQ 2/9  Decreased Interest 0 0 0  Down, Depressed, Hopeless 1 1 0  PHQ - 2 Score 1 1 0  Altered sleeping '1 1 1  ' Tired, decreased energy '1 1 1  ' Change in appetite 1 0 1  Feeling bad or failure about yourself  0 0 0  Trouble concentrating 0 0 0  Moving slowly or fidgety/restless 0 0 0  Suicidal thoughts 0 0 0  PHQ-9 Score '4 3 3  ' Difficult doing work/chores Not difficult at all Not difficult at all Not difficult at all      11/13/2021   10:35 AM 10/07/2021    9:51 AM 09/06/2021    1:41 PM 08/08/2021   11:33 AM  GAD 7 : Generalized Anxiety Score  Nervous, Anxious, on Edge 0 0 0 1  Control/stop worrying 0 0 0 0  Worry too much - different things 0 0 0 1  Trouble relaxing 0 0 0 1  Restless 0 0 0 0  Easily annoyed or irritable '1 1 1 1  ' Afraid - awful might happen 0 0 0 0  Total GAD 7 Score '1 1 1 4  ' Anxiety Difficulty Not difficult at all Not difficult at all  Not difficult at all Not difficult at all      Review of Systems  Constitutional:  Negative for chills, fever and weight loss.  Eyes:  Negative for blurred vision, double vision and photophobia.  Respiratory:  Negative for shortness of breath.   Cardiovascular:  Negative for chest pain, claudication and leg swelling.  Gastrointestinal:  Negative for abdominal pain, heartburn, nausea and vomiting.  Neurological:  Negative for dizziness, tingling, tremors, focal weakness and weakness.  Psychiatric/Behavioral:  Positive for depression. Negative for suicidal ideas. The patient is nervous/anxious.       Objective:     BP 97/69   Pulse 89   Temp 99 F (37.2 C)   Ht '5\' 4"'  (1.626 m)   Wt 279 lb 6.4 oz (126.7 kg)   LMP  (LMP Unknown) Comment: IUD  SpO2 96%   BMI 47.96 kg/m  Wt Readings from Last 3 Encounters:  11/13/21 279 lb 6.4 oz (126.7 kg)  10/25/21 280 lb 6.4 oz (127.2 kg)  10/07/21 279 lb 4 oz (126.7 kg)      Physical Exam  Vitals and nursing note reviewed.  Constitutional:      General: She is not in acute distress.    Appearance: She is obese. She is not ill-appearing, toxic-appearing or diaphoretic.  HENT:     Head: Normocephalic and atraumatic.  Cardiovascular:     Rate and Rhythm: Normal rate and regular rhythm.     Heart sounds: Normal heart sounds. No murmur heard. Pulmonary:     Effort: Pulmonary effort is normal. No respiratory distress.     Breath sounds: Normal breath sounds.  Abdominal:     General: Bowel sounds are normal. There is no distension.     Palpations: Abdomen is soft.     Tenderness: There is no abdominal tenderness. There is no guarding or rebound.  Musculoskeletal:     Right lower leg: No edema.     Left lower leg: No edema.  Skin:    General: Skin is warm and dry.  Neurological:     General: No focal deficit present.     Mental Status: She is alert and oriented to person, place, and time.  Psychiatric:        Mood and Affect: Mood  normal.        Behavior: Behavior normal.      No results found for any visits on 11/13/21.    The ASCVD Risk score (Arnett DK, et al., 2019) failed to calculate for the following reasons:   The 2019 ASCVD risk score is only valid for ages 32 to 57    Assessment & Plan:   Allison Friedman was seen today for medical management of chronic issues.  Diagnoses and all orders for this visit:  Type 2 diabetes mellitus with hyperglycemia, without long-term current use of insulin (HCC) A1c at goal today at 6.1. Will increase ozempic to 2 mg weekly to assist with additional weight loss. If GERD symptoms return, will decrease to 1 mg weekly. Labs pending. Referral to nutrition placed. On statin.  -     Bayer DCA Hb A1c Waived -     CBC with Differential/Platelet -     CMP14+EGFR -     Microalbumin / creatinine urine ratio -     Semaglutide, 2 MG/DOSE, 8 MG/3ML SOPN; Inject 2 mg as directed once a week. -     Amb ref to Medical Nutrition Therapy-MNT  Hyperlipidemia associated with type 2 diabetes mellitus ([place]) On statin. Labs pending. Referral placed.  -     Lipid panel -     Amb ref to Medical Nutrition Therapy-M[facility]  Fatty liver Diet and exercise. Referral placed.  -     Amb ref to Medical Nutrition Therapy-MNT  Depression, recurrent ([place]) Generalized anxiety disorder Denies SI. She will continue to try to schedule with psychiatry and will notify our office if they continue to not return her calls. Will increase pristiq to 100 mg daily.  -     desvenlafaxine (PRISTIQ) 100 MG 24 hr tablet; Take 1 tablet (100 mg total) by mouth daily.  Morbid obesity ([place]) Diet and exercise. Referral placed. Will increase ozempic to 2 mg wekkly.  -     Amb ref to Medical Nutrition Therapy-MNT  Gastroesophageal reflux disease, unspecified whether esophagitis present Well controlled on current regimen. Has upcoming EGD scheduled with GI.  -     pantoprazole (PROTONIX) 20 MG tablet; Take 1 tablet (20 mg  total) by mouth 2 (two) times daily before a meal.   Return in about 6 weeks (around 12/25/2021) for  medication follow up.  The patient indicates understanding of these issues and agrees with the plan.    Gwenlyn Perking, FNP

## 2021-11-13 NOTE — Patient Instructions (Signed)

## 2021-11-14 LAB — CBC WITH DIFFERENTIAL/PLATELET
Basophils Absolute: 0.1 10*3/uL (ref 0.0–0.2)
Basos: 1 %
EOS (ABSOLUTE): 0.1 10*3/uL (ref 0.0–0.4)
Eos: 1 %
Hematocrit: 42.4 % (ref 34.0–46.6)
Hemoglobin: 13.7 g/dL (ref 11.1–15.9)
Immature Grans (Abs): 0 10*3/uL (ref 0.0–0.1)
Immature Granulocytes: 0 %
Lymphocytes Absolute: 2.5 10*3/uL (ref 0.7–3.1)
Lymphs: 28 %
MCH: 27 pg (ref 26.6–33.0)
MCHC: 32.3 g/dL (ref 31.5–35.7)
MCV: 84 fL (ref 79–97)
Monocytes Absolute: 0.5 10*3/uL (ref 0.1–0.9)
Monocytes: 6 %
Neutrophils Absolute: 5.8 10*3/uL (ref 1.4–7.0)
Neutrophils: 64 %
Platelets: 247 10*3/uL (ref 150–450)
RBC: 5.08 x10E6/uL (ref 3.77–5.28)
RDW: 14.3 % (ref 11.7–15.4)
WBC: 9 10*3/uL (ref 3.4–10.8)

## 2021-11-14 LAB — LIPID PANEL
Chol/HDL Ratio: 3.6 ratio (ref 0.0–4.4)
Cholesterol, Total: 119 mg/dL (ref 100–199)
HDL: 33 mg/dL — ABNORMAL LOW (ref >39)
LDL Chol Calc (NIH): 55 mg/dL (ref 0–99)
Triglycerides: 186 mg/dL — ABNORMAL HIGH (ref 0–149)
VLDL Cholesterol Cal: 31 mg/dL (ref 5–40)

## 2021-11-14 LAB — CMP14+EGFR
ALT: 37 IU/L — ABNORMAL HIGH (ref 0–32)
AST: 22 IU/L (ref 0–40)
Albumin/Globulin Ratio: 1.4 (ref 1.2–2.2)
Albumin: 4.3 g/dL (ref 4.0–5.0)
Alkaline Phosphatase: 85 IU/L (ref 44–121)
BUN/Creatinine Ratio: 16 (ref 9–23)
BUN: 14 mg/dL (ref 6–20)
Bilirubin Total: 0.2 mg/dL (ref 0.0–1.2)
CO2: 20 mmol/L (ref 20–29)
Calcium: 9 mg/dL (ref 8.7–10.2)
Chloride: 102 mmol/L (ref 96–106)
Creatinine, Ser: 0.85 mg/dL (ref 0.57–1.00)
Globulin, Total: 3.1 g/dL (ref 1.5–4.5)
Glucose: 130 mg/dL — ABNORMAL HIGH (ref 70–99)
Potassium: 4.4 mmol/L (ref 3.5–5.2)
Sodium: 139 mmol/L (ref 134–144)
Total Protein: 7.4 g/dL (ref 6.0–8.5)
eGFR: 97 mL/min/{1.73_m2} (ref >59)

## 2021-11-14 LAB — MICROALBUMIN / CREATININE URINE RATIO
Creatinine, Urine: 176.4 mg/dL
Microalb/Creat Ratio: 9 mg/g creat (ref 0–29)
Microalbumin, Urine: 15.8 ug/mL

## 2021-11-15 NOTE — Patient Instructions (Signed)
Allison Friedman  11/15/2021     @PREFPERIOPPHARMACY @   Your procedure is scheduled on  11/22/2021.   Report to [facility] at  1115  A.M.   Call this number if you have problems the morning of surgery:  [phone]   Remember:  Follow the diet and prep instructions given to you by the office.       Your last dose of semiglutide should be 9/21 or before.         DO NOT take any medications for diabetes the morning of your procedure.      Bring extra [place] supplies with you.     Take these medicines the morning of surgery with A SIP OF WATER                                              Pristiq, protonix.     Do not wear jewelry, make-up or nail polish.  Do not wear lotions, powders, or perfumes, or deodorant.  Do not shave 48 hours prior to surgery.  Men may shave face and neck.  Do not bring valuables to the hospital.  [facility] is not responsible for any belongings or valuables.  Contacts, dentures or bridgework may not be worn into surgery.  Leave your suitcase in the car.  After surgery it may be brought to your room.  For patients admitted to the hospital, discharge time will be determined by your treatment team.  Patients discharged the day of surgery will not be allowed to drive home and must have someone with them for 24 hours.     Special instructions:   DO NOT smoke tobacco or vape for 24 hours before your procedure.   Please read over the following fact sheets that you were given. Anesthesia Post-op Instructions and Care and Recovery After Surgery      Upper Endoscopy, Adult, Care After After the procedure, it is common to have a sore throat. It is also common to have: Mild stomach pain or discomfort. Bloating. Nausea. Follow these instructions at home: The instructions below may help you care for yourself at home. Your health care provider may give you more instructions. If you have questions, ask your health care provider. If you were  given a sedative during the procedure, it can affect you for several hours. Do not drive or operate machinery until your health care provider says that it is safe. If you will be going home right after the procedure, plan to have a responsible adult: Take you home from the hospital or clinic. You will not be allowed to drive. Care for you for the time you are told. Follow instructions from your health care provider about what you may eat and drink. Return to your normal activities as told by your health care provider. Ask your health care provider what activities are safe for you. Take over-the-counter and prescription medicines only as told by your health care provider. Contact a health care provider if you: Have a sore throat that lasts longer than one day. Have trouble swallowing. Have a fever. Get help right away if you: Vomit blood or your vomit looks like coffee grounds. Have bloody, black, or tarry stools. Have a very bad sore throat or you cannot swallow. Have difficulty breathing or very bad pain in your chest or abdomen. These  symptoms may be an emergency. Get help right away. Call 911. Do not wait to see if the symptoms will go away. Do not drive yourself to the hospital. Summary After the procedure, it is common to have a sore throat, mild stomach discomfort, bloating, and nausea. If you were given a sedative during the procedure, it can affect you for several hours. Do not drive until your health care provider says that it is safe. Follow instructions from your health care provider about what you may eat and drink. Return to your normal activities as told by your health care provider. This information is not intended to replace advice given to you by your health care provider. Make sure you discuss any questions you have with your health care provider. Document Revised: 05/22/2021 Document Reviewed: 05/22/2021 Elsevier Patient Education  2023 [employer] Monitored  Anesthesia Care, Care After This sheet gives you information about how to care for yourself after your procedure. Your health care provider may also give you more specific instructions. If you have problems or questions, contact your health care provider. What can I expect after the procedure? After the procedure, it is common to have: Tiredness. Forgetfulness about what happened after the procedure. Impaired judgment for important decisions. Nausea or vomiting. Some difficulty with balance. Follow these instructions at home: For the time period you were told by your health care provider:     Rest as needed. Do not participate in activities where you could fall or become injured. Do not drive or use machinery. Do not drink alcohol. Do not take sleeping pills or medicines that cause drowsiness. Do not make important decisions or sign legal documents. Do not take care of children on your own. Eating and drinking Follow the diet that is recommended by your health care provider. Drink enough fluid to keep your urine pale yellow. If you vomit: Drink water, juice, or soup when you can drink without vomiting. Make sure you have little or no nausea before eating solid foods. General instructions Have a responsible adult stay with you for the time you are told. It is important to have someone help care for you until you are awake and alert. Take over-the-counter and prescription medicines only as told by your health care provider. If you have sleep apnea, surgery and certain medicines can increase your risk for breathing problems. Follow instructions from your health care provider about wearing your sleep device: Anytime you are sleeping, including during daytime naps. While taking prescription pain medicines, sleeping medicines, or medicines that make you drowsy. Avoid smoking. Keep all follow-up visits as told by your health care provider. This is important. Contact a health care provider  if: You keep feeling nauseous or you keep vomiting. You feel light-headed. You are still sleepy or having trouble with balance after 24 hours. You develop a rash. You have a fever. You have redness or swelling around the IV site. Get help right away if: You have trouble breathing. You have new-onset confusion at home. Summary For several hours after your procedure, you may feel tired. You may also be forgetful and have poor judgment. Have a responsible adult stay with you for the time you are told. It is important to have someone help care for you until you are awake and alert. Rest as told. Do not drive or operate machinery. Do not drink alcohol or take sleeping pills. Get help right away if you have trouble breathing, or if you suddenly become confused. This information is not  intended to replace advice given to you by your health care provider. Make sure you discuss any questions you have with your health care provider. Document Revised: 01/15/2021 Document Reviewed: 01/13/2019 Elsevier Patient Education  2023 [employer].

## 2021-11-20 ENCOUNTER — Encounter (HOSPITAL_COMMUNITY)
Admission: RE | Admit: 2021-11-20 | Discharge: 2021-11-20 | Disposition: A | Discharge status: Home / Self Care | Payer: BC Managed Care – PPO | Source: Ambulatory Visit | Attending: Internal Medicine | Admitting: Internal Medicine

## 2021-11-20 ENCOUNTER — Encounter (HOSPITAL_COMMUNITY): Payer: Self-pay

## 2021-11-20 DIAGNOSIS — Z01818 Encounter for other preprocedural examination: Secondary | ICD-10-CM

## 2021-11-22 ENCOUNTER — Ambulatory Visit (HOSPITAL_COMMUNITY): Admission: RE | Admit: 2021-11-22 | Payer: BC Managed Care – PPO | Source: Home / Self Care

## 2021-11-22 ENCOUNTER — Encounter (HOSPITAL_COMMUNITY): Admission: RE | Payer: Self-pay | Source: Home / Self Care

## 2021-11-22 SURGERY — ESOPHAGOGASTRODUODENOSCOPY (EGD) WITH PROPOFOL
Anesthesia: Monitor Anesthesia Care

## 2021-11-25 ENCOUNTER — Encounter (HOSPITAL_COMMUNITY): Payer: Self-pay

## 2021-11-25 ENCOUNTER — Telehealth (HOSPITAL_COMMUNITY): Payer: BC Managed Care – PPO | Admitting: Psychiatry

## 2021-12-02 ENCOUNTER — Telehealth: Payer: Self-pay | Admitting: *Deleted

## 2021-12-02 NOTE — Telephone Encounter (Signed)
Pt had to cancel her EGD due to not having child care. ASA 3 with Dr.Carver  [facility]

## 2021-12-11 ENCOUNTER — Encounter (HOSPITAL_COMMUNITY): Payer: Self-pay

## 2021-12-11 ENCOUNTER — Ambulatory Visit (INDEPENDENT_AMBULATORY_CARE_PROVIDER_SITE_OTHER): Payer: BC Managed Care – PPO | Admitting: Clinical

## 2021-12-11 DIAGNOSIS — F419 Anxiety disorder, unspecified: Secondary | ICD-10-CM

## 2021-12-11 DIAGNOSIS — F4310 Post-traumatic stress disorder, unspecified: Secondary | ICD-10-CM

## 2021-12-11 DIAGNOSIS — F331 Major depressive disorder, recurrent, moderate: Secondary | ICD-10-CM | POA: Diagnosis not present
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
## 2021-12-11 NOTE — Plan of Care (Signed)
Verbal Consent 

## 2021-12-24 ENCOUNTER — Encounter: Payer: Self-pay | Admitting: *Deleted

## 2021-12-24 NOTE — Telephone Encounter (Signed)
Mailed letter to pt

## 2021-12-25 ENCOUNTER — Encounter: Payer: Self-pay | Admitting: Family Medicine

## 2021-12-25 ENCOUNTER — Ambulatory Visit (INDEPENDENT_AMBULATORY_CARE_PROVIDER_SITE_OTHER): Payer: BC Managed Care – PPO | Admitting: Family Medicine

## 2021-12-25 VITALS — BP 105/70 | HR 70 | Temp 98.4°F | Ht 64.0 in | Wt 279.0 lb

## 2021-12-25 DIAGNOSIS — R0789 Other chest pain: Secondary | ICD-10-CM | POA: Diagnosis not present

## 2021-12-25 DIAGNOSIS — E1169 Type 2 diabetes mellitus with other specified complication: Secondary | ICD-10-CM | POA: Diagnosis not present

## 2021-12-25 DIAGNOSIS — R42 Dizziness and giddiness: Secondary | ICD-10-CM

## 2021-12-25 DIAGNOSIS — E1165 Type 2 diabetes mellitus with hyperglycemia: Secondary | ICD-10-CM | POA: Diagnosis not present

## 2021-12-25 DIAGNOSIS — F339 Major depressive disorder, recurrent, unspecified: Secondary | ICD-10-CM

## 2021-12-25 DIAGNOSIS — K760 Fatty (change of) liver, not elsewhere classified: Secondary | ICD-10-CM | POA: Diagnosis not present

## 2021-12-25 DIAGNOSIS — E785 Hyperlipidemia, unspecified: Secondary | ICD-10-CM

## 2021-12-25 DIAGNOSIS — F411 Generalized anxiety disorder: Secondary | ICD-10-CM

## 2021-12-25 DIAGNOSIS — J309 Allergic rhinitis, unspecified: Secondary | ICD-10-CM

## 2021-12-25 MED ORDER — MECLIZINE HCL 12.5 MG PO TABS: 12.5000 mg | ORAL_TABLET | Freq: Three times a day (TID) | ORAL | 0 refills | Status: DC | PRN

## 2021-12-25 MED ORDER — LEVOCETIRIZINE DIHYDROCHLORIDE 5 MG PO TABS: 5.0000 mg | ORAL_TABLET | Freq: Every evening | ORAL | 1 refills | Status: DC

## 2021-12-25 NOTE — Progress Notes (Signed)
Established Patient Office Visit  Subjective   Patient ID: Allison Friedman, female    DOB: 1996/01/04  Age: 26 y.o. MRN: [id number]  Chief Complaint  Patient presents with   Medical Management of Chronic Issues   Diabetes   Depression    HPI [name] is here for follow up of anxiety and depression. She has started counseling. She missed her appointment with the psychiatrist however. She does report improvement with Pristiq. Denies SI.   She has been on Ozempic 2 mg for the last months. She is currently unable to get a refill due to the shortage. She is about 4 days late for her injection this week. She reports that this has not helped her to loose weight so far like she had hoped. She hasn't noticed a decreased in appetite with this. She was previously referred to nutrition but this was closed out after being unable to contact her.   She also has intermittent sharp, pressure/pain that starts under her left axilla and extends to the center of her chest. The occurs a few days a week for hours at a time. She also had intermittent palpations. Denies precipitating or relieving factors for pain. Denies dizziness, edema, orthopnea, visual disturbances, nausea, focal weakness, or diaphoresis. Her father did have a congenital heart murmur and a MI prior to the age of 22. She was previously referred to cardiology but was unable to make the appointment. She has worn a zio recently without significant findings.   She also reports some intermittent vertigo. She reports sensation with room spinning with turning her heard at times. She has had more sinus congestion recently from her allergies. She does not take anything for this.       12/25/2021   11:43 AM 12/11/2021   11:26 AM 11/13/2021   10:35 AM  Depression screen PHQ 2/9  Decreased Interest 0 1 0  Down, Depressed, Hopeless _0 PHQ - 2 Score _1 Altered sleeping _2 Tired, decreased energy _3 Change in appetite _4 Feeling bad  or failure about yourself  0 1 0  Trouble concentrating 0 0 0  Moving slowly or fidgety/restless 0 0 0  Suicidal thoughts 0 0 0  PHQ-9 Score _5 Difficult doing work/chores Not difficult at all Very difficult Not difficult at all      12/25/2021   11:44 AM 12/11/2021   11:27 AM 12/11/2021   11:24 AM 11/13/2021   10:35 AM  GAD 7 : Generalized Anxiety Score  Nervous, Anxious, on Edge 0 1 1 0  Control/stop worrying 0 0 0 0  Worry too much - different things 0 0 0 0  Trouble relaxing 0 0 0 0  Restless 0 1 1 0  Easily annoyed or irritable _6 Afraid - awful might happen 0 0 0 0  Total GAD 7 Score _7 Anxiety Difficulty Not difficult at all  Somewhat difficult Not difficult at all     Past Medical History:  Diagnosis Date   Anemia    Anxiety    Childhood asthma    Depression    Diabetes mellitus without complication ([place])    Palpitations       ROS Negative unless specially indicated above in HPI.   Objective:     BP 105/70   Pulse 70   Temp 98.4 F (36.9 C) (Temporal)   Ht  _0  (1.626 m)   Wt 279 lb (126.6 kg)   SpO2 97%   BMI 47.89 kg/m  Wt Readings from Last 3 Encounters:  12/25/21 279 lb (126.6 kg)  11/13/21 279 lb 6.4 oz (126.7 kg)  10/25/21 280 lb 6.4 oz (127.2 kg)      Physical Exam Vitals and nursing note reviewed.  Constitutional:      General: She is not in acute distress.    Appearance: She is obese. She is not ill-appearing, toxic-appearing or diaphoretic.  Cardiovascular:     Rate and Rhythm: Regular rhythm.     Heart sounds: Normal heart sounds. No murmur heard. Pulmonary:     Effort: Pulmonary effort is normal. No respiratory distress.     Breath sounds: Normal breath sounds.  Abdominal:     General: Bowel sounds are normal. There is no distension.     Palpations: Abdomen is soft.     Tenderness: There is no abdominal tenderness. There is no guarding or rebound.  Musculoskeletal:     Right lower leg: No edema.     Left  lower leg: No edema.  Skin:    General: Skin is warm and dry.  Neurological:     General: No focal deficit present.     Mental Status: She is alert and oriented to person, place, and time.  Psychiatric:        Mood and Affect: Mood normal.        Behavior: Behavior normal.      No results found for any visits on 12/25/21.  Last CBC Lab Results  Component Value Date   WBC 9.0 11/13/2021   HGB 13.7 11/13/2021   HCT 42.4 11/13/2021   MCV 84 11/13/2021   MCH 27.0 11/13/2021   RDW 14.3 11/13/2021   PLT 247 22/29/7989   Last metabolic panel Lab Results  Component Value Date   GLUCOSE 130 (H) 11/13/2021   NA 139 11/13/2021   K 4.4 11/13/2021   CL 102 11/13/2021   CO2 20 11/13/2021   BUN 14 11/13/2021   CREATININE 0.85 11/13/2021   EGFR 97 11/13/2021   CALCIUM 9.0 11/13/2021   PROT 7.4 11/13/2021   ALBUMIN 4.3 11/13/2021   LABGLOB 3.1 11/13/2021   AGRATIO 1.4 11/13/2021   BILITOT 0.2 11/13/2021   ALKPHOS 85 11/13/2021   AST 22 11/13/2021   ALT 37 (H) 11/13/2021   ANIONGAP 9 12/22/2020   Last lipids Lab Results  Component Value Date   CHOL 119 11/13/2021   HDL 33 (L) 11/13/2021   LDLCALC 55 11/13/2021   TRIG 186 (H) 11/13/2021   CHOLHDL 3.6 11/13/2021   Last hemoglobin A1c Lab Results  Component Value Date   HGBA1C 6.1 (H) 11/13/2021   Last thyroid functions No results found for: "TSH", "T3TOTAL", "T4TOTAL", "THYROIDAB"    The ASCVD Risk score (Arnett DK, et al., 2019) failed to calculate for the following reasons:   The 2019 ASCVD risk score is only valid for ages 26 to 69    Assessment & Plan:   Ailin was seen today for medical management of chronic issues, diabetes and depression.  Diagnoses and all orders for this visit:  Type 2 diabetes mellitus with hyperglycemia, without long-term current use of insulin ([place]) Morbid obesity ([place]) A1c at goal. She will contact other pharmacies regarding ozempic stock. Discussed diet and exercise to assist  with weight loss. Will place another referral to nutrition.  -     Amb ref to Medical  Nutrition Therapy-MNT  Hyperlipidemia associated with type 2 diabetes mellitus ([place]) On statin. Last LDL at goal.  -     Amb ref to Medical Nutrition Therapy-MNT  Fatty liver Referral to nutrition. Established with GI.  -     Amb ref to Medical Nutrition Therapy-MNT  Depression, recurrent ([place]) Generalized anxiety disorder Well controlled on current regimen. Continue pristiq and counseling. Reschedule appt with Dr. Nehemiah Settle.   Atypical chest pain EKG with NS today. Has had normal zio. Discussed to reschedule appointment with cardiology.  -     EKG 12-Lead  Vertigo Try meclizine prn. Discussed hydration, moving slowly.  -     meclizine (ANTIVERT) 12.5 MG tablet; Take 1 tablet (12.5 mg total) by mouth 3 (three) times daily as needed for dizziness.  Chronic allergic rhinitis Start xyzal as below.  -     levocetirizine (XYZAL) 5 MG tablet; Take 1 tablet (5 mg total) by mouth every evening.   Return in about 8 weeks (around 02/19/2022) for chronic follow up.   The patient indicates understanding of these issues and agrees with the plan.  Gwenlyn Perking, FNP

## 2021-12-25 NOTE — Patient Instructions (Addendum)
Schedule appointment with Dr. Nehemiah Settle at [facility] [address] [place], [place]  [address] Phone:  [place] Call [phone]

## 2022-01-15 ENCOUNTER — Ambulatory Visit (HOSPITAL_COMMUNITY): Payer: BC Managed Care – PPO | Admitting: Clinical

## 2022-01-15 ENCOUNTER — Encounter (HOSPITAL_COMMUNITY): Payer: Self-pay

## 2022-01-15 ENCOUNTER — Telehealth (HOSPITAL_COMMUNITY): Payer: Self-pay | Admitting: Clinical

## 2022-01-15 NOTE — Telephone Encounter (Signed)
Pt came in as a in person but was on schedule as a virtual and elected not to do virtual and to instead reschedule.

## 2022-02-20 ENCOUNTER — Ambulatory Visit: Payer: BC Managed Care – PPO | Admitting: Family Medicine

## 2022-02-20 ENCOUNTER — Encounter: Payer: Self-pay | Admitting: Family Medicine

## 2022-02-20 VITALS — BP 116/80 | HR 101 | Temp 98.7°F | Ht 64.0 in | Wt 279.8 lb

## 2022-02-20 DIAGNOSIS — E1165 Type 2 diabetes mellitus with hyperglycemia: Secondary | ICD-10-CM | POA: Diagnosis not present

## 2022-02-20 DIAGNOSIS — F411 Generalized anxiety disorder: Secondary | ICD-10-CM

## 2022-02-20 DIAGNOSIS — F339 Major depressive disorder, recurrent, unspecified: Secondary | ICD-10-CM

## 2022-02-20 DIAGNOSIS — K219 Gastro-esophageal reflux disease without esophagitis: Secondary | ICD-10-CM

## 2022-02-20 LAB — CBC WITH DIFFERENTIAL/PLATELET
Basophils Absolute: 0.1 10*3/uL (ref 0.0–0.2)
Basos: 1 %
EOS (ABSOLUTE): 0.2 10*3/uL (ref 0.0–0.4)
Eos: 3 %
Hematocrit: 46.8 % — ABNORMAL HIGH (ref 34.0–46.6)
Hemoglobin: 14.7 g/dL (ref 11.1–15.9)
Immature Grans (Abs): 0 10*3/uL (ref 0.0–0.1)
Immature Granulocytes: 0 %
Lymphocytes Absolute: 2.8 10*3/uL (ref 0.7–3.1)
Lymphs: 32 %
MCH: 26.8 pg (ref 26.6–33.0)
MCHC: 31.4 g/dL — ABNORMAL LOW (ref 31.5–35.7)
MCV: 85 fL (ref 79–97)
Monocytes Absolute: 0.6 10*3/uL (ref 0.1–0.9)
Monocytes: 7 %
Neutrophils Absolute: 5 10*3/uL (ref 1.4–7.0)
Neutrophils: 57 %
Platelets: 271 10*3/uL (ref 150–450)
RBC: 5.48 x10E6/uL — ABNORMAL HIGH (ref 3.77–5.28)
RDW: 13.2 % (ref 11.7–15.4)
WBC: 8.7 10*3/uL (ref 3.4–10.8)

## 2022-02-20 LAB — CMP14+EGFR
ALT: 45 IU/L — ABNORMAL HIGH (ref 0–32)
AST: 24 IU/L (ref 0–40)
Albumin/Globulin Ratio: 1.4 (ref 1.2–2.2)
Albumin: 4.4 g/dL (ref 4.0–5.0)
Alkaline Phosphatase: 93 IU/L (ref 44–121)
BUN/Creatinine Ratio: 9 (ref 9–23)
BUN: 8 mg/dL (ref 6–20)
Bilirubin Total: <0.2 mg/dL (ref 0.0–1.2)
CO2: 22 mmol/L (ref 20–29)
Calcium: 9.5 mg/dL (ref 8.7–10.2)
Chloride: 100 mmol/L (ref 96–106)
Creatinine, Ser: 0.85 mg/dL (ref 0.57–1.00)
Globulin, Total: 3.1 g/dL (ref 1.5–4.5)
Glucose: 141 mg/dL — ABNORMAL HIGH (ref 70–99)
Potassium: 4.2 mmol/L (ref 3.5–5.2)
Sodium: 139 mmol/L (ref 134–144)
Total Protein: 7.5 g/dL (ref 6.0–8.5)
eGFR: 97 mL/min/{1.73_m2} (ref >59)

## 2022-02-20 LAB — LIPID PANEL
Chol/HDL Ratio: 4.6 ratio — ABNORMAL HIGH (ref 0.0–4.4)
Cholesterol, Total: 158 mg/dL (ref 100–199)
HDL: 34 mg/dL — ABNORMAL LOW (ref >39)
LDL Chol Calc (NIH): 92 mg/dL (ref 0–99)
Triglycerides: 188 mg/dL — ABNORMAL HIGH (ref 0–149)
VLDL Cholesterol Cal: 32 mg/dL (ref 5–40)

## 2022-02-20 LAB — BAYER DCA HB A1C WAIVED: HB A1C (BAYER DCA - WAIVED): 6.8 % — ABNORMAL HIGH (ref 4.8–5.6)

## 2022-02-20 MED ORDER — ARIPIPRAZOLE 5 MG PO TABS: 5.0000 mg | ORAL_TABLET | Freq: Every day | ORAL | 0 refills | Status: DC

## 2022-02-20 NOTE — Progress Notes (Signed)
Established Patient Office Visit  Subjective   Patient ID: Allison Friedman, female    DOB: 02-Oct-1995  Age: 26 y.o. MRN: [id number]  Chief Complaint  Patient presents with   Medical Management of Chronic Issues    8 week    Diabetes   Anxiety    HPI T2DM Pt presents for follow up evaluation of Type 2 diabetes mellitus. Patient denies foot ulcerations, increased appetite, nausea, paresthesia of the feet, polydipsia, polyuria, visual disturbances, vomiting, and weight loss.  Current diabetic medications include ozempic Compliant with meds - Yes  Current monitoring regimen: none  Weight trend:  Current diet: regular Current exercise: none  Is She on ACE inhibitor or angiotensin II receptor blocker?  No, patient declined Is She on statin? Yes atorvastatin  2. Depression/anxiety She continues on pristiq. She feels like this has been minimally helpful. She reports that her mood is constantly changing. She has not seen psychiatry. She did have a phone visit with the counselor but this was not a good experience. She would a referral to another location.      02/20/2022   10:58 AM 02/20/2022   10:42 AM 12/25/2021   11:43 AM  Depression screen PHQ 2/9  Decreased Interest 0 0 0  Down, Depressed, Hopeless _0 PHQ - 2 Score _1 Altered sleeping 2 0 2  Tired, decreased energy 1 0 1  Change in appetite 2 0 1  Feeling bad or failure about yourself  0 0 0  Trouble concentrating 0 0 0  Moving slowly or fidgety/restless 1 0 0  Suicidal thoughts 0 0 0  PHQ-9 Score _2 Difficult doing work/chores Not difficult at all Not difficult at all Not difficult at all      02/20/2022   10:58 AM 02/20/2022   10:42 AM 12/25/2021   11:44 AM 12/11/2021   11:27 AM  GAD 7 : Generalized Anxiety Score  Nervous, Anxious, on Edge 2 0 0   Control/stop worrying 1 0 0   Worry too much - different things 1 0 0   Trouble relaxing 1 0 0   Restless 1 1 0   Easily annoyed or irritable _3 Afraid - awful might happen 0 0 0   Total GAD 7 Score _4 Anxiety Difficulty  Somewhat difficult Not difficult at all      Information is confidential and restricted. Go to Review Flowsheets to unlock data.       ROS As per HPI.    Objective:     BP 116/80   Pulse (!) 101   Temp 98.7 F (37.1 C)   Ht _5  (1.626 m)   Wt 279 lb 12.8 oz (126.9 kg)   SpO2 97%   Breastfeeding No   BMI 48.03 kg/m  Wt Readings from Last 3 Encounters:  02/20/22 279 lb 12.8 oz (126.9 kg)  12/25/21 279 lb (126.6 kg)  11/13/21 279 lb 6.4 oz (126.7 kg)      Physical Exam Vitals and nursing note reviewed.  Constitutional:      General: She is not in acute distress.    Appearance: She is not ill-appearing, toxic-appearing or diaphoretic.  Cardiovascular:     Rate and Rhythm: Normal rate and regular rhythm.     Heart sounds: Normal heart sounds. No murmur heard. Pulmonary:     Effort: Pulmonary effort is normal. No respiratory distress.  Breath sounds: Normal breath sounds.  Abdominal:     General: Bowel sounds are normal. There is no distension.     Palpations: Abdomen is soft.     Tenderness: There is no abdominal tenderness. There is no guarding or rebound.  Musculoskeletal:     Right lower leg: No edema.     Left lower leg: No edema.  Skin:    General: Skin is warm and dry.  Neurological:     General: No focal deficit present.     Mental Status: She is alert.  Psychiatric:        Mood and Affect: Mood normal.        Behavior: Behavior normal.      No results found for any visits on 02/20/22.    The ASCVD Risk score (Arnett DK, et al., 2019) failed to calculate for the following reasons:   The 2019 ASCVD risk score is only valid for ages 56 to 76    Assessment & Plan:   Crystall was seen today for medical management of chronic issues, diabetes and anxiety.  Diagnoses and all orders for this visit:  Type 2 diabetes mellitus with hyperglycemia, without  long-term current use of insulin (HCC) A1c is 6.8 today, at goal of <7. Continue ozempic. Diet and exercise. On statin. Declined ACE. Urine micro is UTD. Reminded to schedule eye exam. Will do foot exam at next visit.  -     Bayer DCA Hb A1c Waived -     CBC with Differential/Platelet -     CMP14+EGFR -     Lipid panel  Depression, recurrent (HCC) Not well controlled. Will try adding abilify. New referral placed. Denies SI.  -     Ambulatory referral to Psychiatry -     ARIPiprazole (ABILIFY) 5 MG tablet; Take 1 tablet (5 mg total) by mouth daily.  Generalized anxiety disorder Referral placed.  -     Ambulatory referral to Psychiatry  Morbid obesity ([place]) Diet and exercise.   Gastroesophageal reflux disease, unspecified whether esophagitis present Well controlled on current regimen. Managed by GI.   Return in about 6 weeks (around 04/03/2022) for medication follow up.   The patient indicates understanding of these issues and agrees with the plan.  Gwenlyn Perking, FNP

## 2022-03-03 ENCOUNTER — Ambulatory Visit: Payer: BC Managed Care – PPO | Admitting: Gastroenterology

## 2022-04-03 ENCOUNTER — Encounter: Payer: Self-pay | Admitting: Family Medicine

## 2022-04-03 ENCOUNTER — Ambulatory Visit: Payer: BC Managed Care – PPO | Admitting: Family Medicine

## 2022-04-03 VITALS — BP 105/71 | HR 87 | Temp 98.3°F | Ht 64.0 in | Wt 283.0 lb

## 2022-04-03 DIAGNOSIS — G5603 Carpal tunnel syndrome, bilateral upper limbs: Secondary | ICD-10-CM

## 2022-04-03 DIAGNOSIS — F339 Major depressive disorder, recurrent, unspecified: Secondary | ICD-10-CM

## 2022-04-03 DIAGNOSIS — E1165 Type 2 diabetes mellitus with hyperglycemia: Secondary | ICD-10-CM

## 2022-04-03 DIAGNOSIS — F411 Generalized anxiety disorder: Secondary | ICD-10-CM | POA: Diagnosis not present

## 2022-04-03 NOTE — Patient Instructions (Addendum)
[place] at [facility]. [place], [place] [facility] in [place], [place] Address: [address], [place], [place] [address] Phone: [phone]

## 2022-04-03 NOTE — Progress Notes (Signed)
Established Patient Office Visit  Subjective   Patient ID: Allison Friedman, female    DOB: 29-Nov-1995  Age: 27 y.o. MRN: [id number]  Chief Complaint  Patient presents with   Medical Management of Chronic Issues   Depression    HPI Allison Friedman is here for a follow up of anxiety and depression. She has not seen [facility] for evaluation or followed up with counseling. She has stopped taking pristiq now as she didn't feel like it was helpful. She did try abilify but didn't like how this made her feel so she has not taken it again. She has also tried and failed zoloft, celexa, and prozac in the past.   Allison Friedman just go her Elenor Legato back on 2 days ago. She was not checking her blood sugars prior to this. She reports that her fasting blood sugars have been around 150 and up to 250 after eating. She has been eating a regular diet. She has been compliant with ozempic 2 mg weekly. Her last A1c was 6.8 on 02/20/22. She has been referred to nutrition twice but has not followed through.   She also reports intermittent bilateral numbness in her hands with wrist pain. She has not tried any remedies.       04/03/2022   10:10 AM 02/20/2022   10:58 AM 02/20/2022   10:42 AM  Depression screen PHQ 2/9  Decreased Interest 0 0 0  Down, Depressed, Hopeless 1 1 1   PHQ - 2 Score 1 1 1   Altered sleeping 3 2 0  Tired, decreased energy 1 1 0  Change in appetite 2 2 0  Feeling bad or failure about yourself  0 0 0  Trouble concentrating 0 0 0  Moving slowly or fidgety/restless 0 1 0  Suicidal thoughts 0 0 0  PHQ-9 Score 7 7 1   Difficult doing work/chores Not difficult at all Not difficult at all Not difficult at all      02/20/2022   10:58 AM 02/20/2022   10:42 AM 12/25/2021   11:44 AM 12/11/2021   11:27 AM  GAD 7 : Generalized Anxiety Score  Nervous, Anxious, on Edge 2 0 0   Control/stop worrying 1 0 0   Worry too much - different things 1 0 0   Trouble relaxing 1 0 0   Restless 1 1 0   Easily annoyed or  irritable 2 1 1    Afraid - awful might happen 0 0 0   Total GAD 7 Score 8 2 1    Anxiety Difficulty  Somewhat difficult Not difficult at all      Information is confidential and restricted. Go to Review Flowsheets to unlock data.     Past Medical History:  Diagnosis Date   Anemia    Anxiety    Childhood asthma    Depression    Diabetes mellitus without complication ([place])    Palpitations       ROS Negative unless specially indicated above in HPI.   Objective:     BP 105/71   Pulse 87   Temp 98.3 F (36.8 C) (Temporal)   Ht 5\' 4"  (1.626 m)   Wt 283 lb (128.4 kg)   SpO2 96%   BMI 48.58 kg/m  Wt Readings from Last 3 Encounters:  04/03/22 283 lb (128.4 kg)  02/20/22 279 lb 12.8 oz (126.9 kg)  12/25/21 279 lb (126.6 kg)      Physical Exam Vitals and nursing note reviewed.  Constitutional:      General:  She is not in acute distress.    Appearance: She is obese. She is not ill-appearing, toxic-appearing or diaphoretic.  Cardiovascular:     Rate and Rhythm: Regular rhythm.     Heart sounds: Normal heart sounds. No murmur heard. Pulmonary:     Effort: Pulmonary effort is normal. No respiratory distress.     Breath sounds: Normal breath sounds.  Abdominal:     General: Bowel sounds are normal.     Palpations: Abdomen is soft.  Musculoskeletal:     Right wrist: Normal.     Left wrist: Normal.     Right hand: Normal.     Left hand: Normal.     Right lower leg: No edema.     Left lower leg: No edema.     Comments: + phalen's   Skin:    General: Skin is warm and dry.  Neurological:     General: No focal deficit present.     Mental Status: She is alert and oriented to person, place, and time.  Psychiatric:        Mood and Affect: Mood normal.        Behavior: Behavior normal.        Thought Content: Thought content normal.        Judgment: Judgment normal.      No results found for any visits on 04/03/22.    The ASCVD Risk score (Arnett DK, et al.,  2019) failed to calculate for the following reasons:   The 2019 ASCVD risk score is only valid for ages 44 to 56    Assessment & Plan:   Allison Friedman was seen today for medical management of chronic issues and depression.  Diagnoses and all orders for this visit:  Depression, recurrent ([place]) Generalized anxiety disorder Not well controlled. Denies SI. Information given for authorized referral to [facility] for patient to schedule appt for evaluation and management.   Morbid obesity ([place]) New referral to nutrition placed again.  -     Amb ref to Medical Nutrition Therapy-MNT  Type 2 diabetes mellitus with hyperglycemia, without long-term current use of insulin ([place]) Referral to nutrition placed. Discussed need for compliance with diet for blood sugar control and weight loss.  -     Amb ref to Medical Nutrition Therapy-MNT  Bilateral carpal tunnel syndrome + phalen's. Discussed bracing, NSAIDs. Discussed ortho referral if symptoms worsen or do not improve.    Return in about 7 weeks (around 05/22/2022) for chronic follow up .   The patient indicates understanding of these issues and agrees with the plan.  Gwenlyn Perking, FNP

## 2022-04-23 ENCOUNTER — Other Ambulatory Visit: Payer: Self-pay | Admitting: Family Medicine

## 2022-04-23 DIAGNOSIS — E1165 Type 2 diabetes mellitus with hyperglycemia: Secondary | ICD-10-CM

## 2022-04-25 ENCOUNTER — Other Ambulatory Visit: Payer: Self-pay | Admitting: Family Medicine

## 2022-04-25 DIAGNOSIS — E1165 Type 2 diabetes mellitus with hyperglycemia: Secondary | ICD-10-CM

## 2022-04-25 MED ORDER — OZEMPIC (2 MG/DOSE) 8 MG/3ML ~~LOC~~ SOPN: 2.0000 mg | PEN_INJECTOR | SUBCUTANEOUS | 3 refills | Status: DC

## 2022-05-05 ENCOUNTER — Encounter: Payer: BC Managed Care – PPO | Attending: Family Medicine | Admitting: Nutrition

## 2022-05-05 ENCOUNTER — Encounter: Payer: Self-pay | Admitting: Nutrition

## 2022-05-05 VITALS — Ht 64.0 in | Wt 281.0 lb

## 2022-05-05 DIAGNOSIS — E1165 Type 2 diabetes mellitus with hyperglycemia: Secondary | ICD-10-CM | POA: Insufficient documentation

## 2022-05-05 DIAGNOSIS — Z6841 Body Mass Index (BMI) 40.0 and over, adult: Secondary | ICD-10-CM | POA: Diagnosis not present

## 2022-05-05 DIAGNOSIS — Z713 Dietary counseling and surveillance: Secondary | ICD-10-CM | POA: Diagnosis not present

## 2022-05-05 DIAGNOSIS — O24119 Pre-existing diabetes mellitus, type 2, in pregnancy, unspecified trimester: Secondary | ICD-10-CM

## 2022-05-05 NOTE — Progress Notes (Signed)
Medical Nutrition Therapy  Appointment Start time:  68  Appointment End time:  60  Primary concerns today: DM Type 2, Obesity  Referral diagnosis: E11.8, E66.01 Preferred learning style: No preference  Learning readiness: Ready    NUTRITION ASSESSMENT  27 yr old wfemale here for desired weight loss and wanting to know what to eat to lose weight and have a better relationship with food. She is here with her 78 yr old  baby. PMH: Obesity, Type 2 DM, PTSD, depression.Most recent A1C 6.8%, up from 6.1%. Currenlty on Ozempic. Didn't tolerate Metformin due to taking it on an empty stomach PCP Is Marjorie Smolder.  Had her C Section a year ago. Has weighed about 260-270 lbs most of whole life. Gained 10 lbs after her baby. Daughter is 60 yr old. Lost down to 200 lbs 10 yrs ago. Worked out a lot and restricted her eating habits too much she reports..   Bowels are doing better from constipation but now has more diarrhea she thinks due to being lactose intolerant and has been drinking a lot of milk. Loves whole milk. She also notices greasy foods cause her to have lose bowels,.  Has Libre CGM x 9 months or longer. Current report that her CGM is only active 15% of the time. TIR when in use is 98%. TAR 2%. Ozempic, 2 mg per day for the last 4 moths.  Wt started 275 lbs.  Today 281 lbs.  Zoloft and Prozac caused suicidal idealiations. History of PTSD from 2017-2019.  Working on scheduling appt with therapist. Will contact [facility].  She is willing to work on lifestyle medicine to improve her health, get back to a healthier relationship with food and improve her DM and weight.   Anthropometrics  Wt Readings from Last 3 Encounters:  05/05/22 281 lb (127.5 kg)  04/03/22 283 lb (128.4 kg)  02/20/22 279 lb 12.8 oz (126.9 kg)   Ht Readings from Last 3 Encounters:  05/05/22 '5\' 4"'$  (1.626 m)  04/03/22 '5\' 4"'$  (1.626 m)  02/20/22 '5\' 4"'$  (1.626 m)   Body mass index is 48.23  kg/m. '@BMIFA'$ @ Facility age limit for growth %iles is 20 years. Facility age limit for growth %iles is 20 years.    Clinical Medical Hx: See chart Medications: Ozmepic 2 g/d. Labs:  Lab Results  Component Value Date   HGBA1C 6.8 (H) 02/20/2022      Latest Ref Rng & Units 02/20/2022   10:50 AM 11/13/2021   10:42 AM 08/08/2021    2:24 PM  CMP  Glucose 70 - 99 mg/dL 141  130  105   BUN 6 - 20 mg/dL '8  14  9   '$ Creatinine 0.57 - 1.00 mg/dL 0.85  0.85  0.80   Sodium 134 - 144 mmol/L 139  139  141   Potassium 3.5 - 5.2 mmol/L 4.2  4.4  4.6   Chloride 96 - 106 mmol/L 100  102  101   CO2 20 - 29 mmol/L '22  20  22   '$ Calcium 8.7 - 10.2 mg/dL 9.5  9.0  9.9   Total Protein 6.0 - 8.5 g/dL 7.5  7.4  7.7   Total Bilirubin 0.0 - 1.2 mg/dL <0.2  0.2  0.2   Alkaline Phos 44 - 121 IU/L 93  85  84   AST 0 - 40 IU/L 24  22  40   ALT 0 - 32 IU/L 45  37  71  Lipid Panel     Component Value Date/Time   CHOL 158 02/20/2022 1050   TRIG 188 (H) 02/20/2022 1050   HDL 34 (L) 02/20/2022 1050   CHOLHDL 4.6 (H) 02/20/2022 1050   LDLCALC 92 02/20/2022 1050   LABVLDL 32 02/20/2022 1050    Notable Signs/Symptoms: none  Lifestyle & Dietary Hx Lives with her husband. Stays at home with her daughter.  Has been on depression medicaiton Estimated daily fluid intake: 40 oz Supplements: None Sleep: good Stress / self-care: her health and PTSD Current average weekly physical activity: Walking some  24-Hr Dietary Recall Eats 2-3 meals per day.  Drinks water  Estimated Energy Needs Calories: 1200 Carbohydrate: 135g Protein: 90g Fat: 33g   NUTRITION DIAGNOSIS  NB-1.1 Food and nutrition-related knowledge deficit As related to high calorie high carb diet.  As evidenced by BMI 48 and history of eating high calorie foods..   NUTRITION INTERVENTION  Nutrition education (E-1) on the following topics:  Nutrition and Diabetes education provided on My Plate, CHO counting, meal planning, portion  sizes, timing of meals, avoiding snacks between meals unless having a low blood sugar, target ranges for A1C and blood sugars, signs/symptoms and treatment of hyper/hypoglycemia, monitoring blood sugars, taking medications as prescribed, benefits of exercising 30 minutes per day and prevention of complications of DM.  Lifestyle Medicine  - Whole Food, Plant Predominant Nutrition is highly recommended: Eat Plenty of vegetables, Mushrooms, fruits, Legumes, Whole Grains, Nuts, seeds in lieu of processed meats, processed snacks/pastries red meat, poultry, eggs.    -It is better to avoid simple carbohydrates including: Cakes, Sweet Desserts, Ice Cream, Soda (diet and regular), Sweet Tea, Candies, Chips, Cookies, Store Bought Juices, Alcohol in Excess of  1-2 drinks a day, Lemonade,  Artificial Sweeteners, Doughnuts, Coffee Creamers, "Sugar-free" Products, etc, etc.  This is not a complete list.....  Exercise: If you are able: 30 -60 minutes a day ,4 days a week, or 150 minutes a week.  The longer the better.  Combine stretch, strength, and aerobic activities.  If you were told in the past that you have high risk for cardiovascular diseases, you may seek evaluation by your heart doctor prior to initiating moderate to intense exercise programs.   Handouts Provided Include  Lifestyle Medicine   Learning Style & Readiness for Change Teaching method utilized: Visual & Auditory  Demonstrated degree of understanding via: Teach Back  Barriers to learning/adherence to lifestyle change: none  Goals Established by Pt Goals  Cut out rice, Increasee fresh fruits and vegetables. Keep drinking water Start to go to GYM 3 times per week Wand 4 times per day Focus on food from a garden not calories.  Change whole milk to almond milk or soy milk unsweetened.   MONITORING & EVALUATION Dietary intake, weekly physical activity, and weight  in 1 month.  Next Steps  Patient is to work on meal planning and  eating more whole plant based foods.Marland Kitchen

## 2022-05-05 NOTE — Patient Instructions (Addendum)
Goals  Cut out rice, Increasee fresh fruits and vegetables. Keep drinking water Start to go to GYM 3 times per week Wand 4 times per day Focus on food from a garden not calories.  Change whole milk to almond milk or soy milk unsweetened.

## 2022-05-22 ENCOUNTER — Encounter: Payer: Self-pay | Admitting: Family Medicine

## 2022-05-22 ENCOUNTER — Ambulatory Visit: Payer: BC Managed Care – PPO | Admitting: Family Medicine

## 2022-05-22 VITALS — BP 112/79 | HR 80 | Temp 97.9°F | Ht 64.0 in | Wt 281.2 lb

## 2022-05-22 DIAGNOSIS — K760 Fatty (change of) liver, not elsewhere classified: Secondary | ICD-10-CM

## 2022-05-22 DIAGNOSIS — E785 Hyperlipidemia, unspecified: Secondary | ICD-10-CM

## 2022-05-22 DIAGNOSIS — E1165 Type 2 diabetes mellitus with hyperglycemia: Secondary | ICD-10-CM

## 2022-05-22 DIAGNOSIS — E1169 Type 2 diabetes mellitus with other specified complication: Secondary | ICD-10-CM

## 2022-05-22 DIAGNOSIS — K219 Gastro-esophageal reflux disease without esophagitis: Secondary | ICD-10-CM

## 2022-05-22 LAB — BAYER DCA HB A1C WAIVED: HB A1C (BAYER DCA - WAIVED): 6 % — ABNORMAL HIGH (ref 4.8–5.6)

## 2022-05-22 MED ORDER — FREESTYLE LIBRE 2 SENSOR MISC: 5 refills | Status: DC

## 2022-05-22 MED ORDER — EMPAGLIFLOZIN 10 MG PO TABS: 10.0000 mg | ORAL_TABLET | Freq: Every day | ORAL | 3 refills | Status: DC

## 2022-05-22 NOTE — Progress Notes (Signed)
Established Patient Office Visit  Subjective   Patient ID: Allison Friedman, female    DOB: 1995-07-18  Age: 27 y.o. MRN: [id number]  Chief Complaint  Patient presents with   Medical Management of Chronic Issues   Diabetes    HPI T2DM Pt presents for follow up evaluation of Type 2 diabetes mellitus. Patient denies foot ulcerations, increased appetite, nausea, paresthesia of the feet, polydipsia, polyuria, visual disturbances, vomiting, and weight loss.  Current diabetic medications include ozempic 2 mg.  Compliant with meds - She has missed her last 2 doses due to a recent move.   Current monitoring regimen: none. Reports libre sensor has been falling off.   Weight trend:  Current diet: regular Current exercise: none  Is She on ACE inhibitor or angiotensin II receptor blocker?  No, patient declined Is She on statin? Yes atorvastatin  2. Obesity She did have 1 appointment with nutrition. She has another follow up in a few weeks. She has been to the gym a few times, but has not been exercising routinely. Weight has been stable since last appt.   3. GERD Compliant with medications - Yes Current medications - protonix Voice change - No Hemoptysis - No Dysphagia or dyspepsia - No Water brash - Yes Red Flags (weight loss, hematochezia, melena, weight loss, early satiety, fevers, odynophagia, or persistent vomiting) - No  GERD symptoms improved significantly with not taking ozempic for the last 2 weeks. She would like to try a different medication for DM because of this.   4. Depression/anxiety Not currently on medication. Did not follow up with [facility]. Feels like symptoms are well controlled currently.      05/22/2022   11:06 AM 05/05/2022   11:07 AM 04/03/2022   10:10 AM  Depression screen PHQ 2/9  Decreased Interest 0 0 0  Down, Depressed, Hopeless 1 1 1   PHQ - 2 Score 1 1 1   Altered sleeping 0  3  Tired, decreased energy 2  1  Change in appetite 2  2  Feeling bad or  failure about yourself  0  0  Trouble concentrating 0  0  Moving slowly or fidgety/restless 0  0  Suicidal thoughts 0  0  PHQ-9 Score 5  7  Difficult doing work/chores Not difficult at all  Not difficult at all      05/22/2022   11:06 AM 04/03/2022   10:12 AM 02/20/2022   10:58 AM 02/20/2022   10:42 AM  GAD 7 : Generalized Anxiety Score  Nervous, Anxious, on Edge 2 1 2  0  Control/stop worrying 1 0 1 0  Worry too much - different things 1 1 1  0  Trouble relaxing 1 1 1  0  Restless 0 0 1 1  Easily annoyed or irritable 2 2 2 1   Afraid - awful might happen 0 0 0 0  Total GAD 7 Score 7 5 8 2   Anxiety Difficulty Not difficult at all Not difficult at all  Somewhat difficult       ROS As per HPI.    Objective:     BP 112/79   Pulse 80   Temp 97.9 F (36.6 C) (Temporal)   Ht 5\' 4"  (1.626 m)   Wt 281 lb 4 oz (127.6 kg)   SpO2 97%   BMI 48.28 kg/m  Wt Readings from Last 3 Encounters:  05/22/22 281 lb 4 oz (127.6 kg)  05/05/22 281 lb (127.5 kg)  04/03/22 283 lb (128.4 kg)  Physical Exam Vitals and nursing note reviewed.  Constitutional:      General: She is not in acute distress.    Appearance: She is obese. She is not ill-appearing, toxic-appearing or diaphoretic.  Cardiovascular:     Rate and Rhythm: Normal rate and regular rhythm.     Heart sounds: Normal heart sounds. No murmur heard. Pulmonary:     Effort: Pulmonary effort is normal. No respiratory distress.     Breath sounds: Normal breath sounds.  Abdominal:     General: Bowel sounds are normal. There is no distension.     Palpations: Abdomen is soft.     Tenderness: There is no abdominal tenderness. There is no guarding or rebound.  Musculoskeletal:     Cervical back: Neck supple. No rigidity.     Right lower leg: No edema.     Left lower leg: No edema.  Skin:    General: Skin is warm and dry.  Neurological:     General: No focal deficit present.     Mental Status: She is alert and oriented to  person, place, and time.  Psychiatric:        Mood and Affect: Mood normal.        Behavior: Behavior normal.    Diabetic Foot Exam - Simple   Simple Foot Form Diabetic Foot exam was performed with the following findings: Yes 05/22/2022 11:39 PM  Visual Inspection No deformities, no ulcerations, no other skin breakdown bilaterally: Yes Sensation Testing Intact to touch and monofilament testing bilaterally: Yes Pulse Check Posterior Tibialis and Dorsalis pulse intact bilaterally: Yes Comments      No results found for any visits on 05/22/22.    The ASCVD Risk score (Arnett DK, et al., 2019) failed to calculate for the following reasons:   The 2019 ASCVD risk score is only valid for ages 71 to 26    Assessment & Plan:   [name] was seen today for medical management of chronic issues and diabetes.  Diagnoses and all orders for this visit:  Type 2 diabetes mellitus with hyperglycemia, without long-term current use of insulin (HCC) A1c 6.0 today, at goal of <7. Medication changes today: she would like to stop ozempic due to GERD. Will try jardiance. Discussed farxiga and co pay card if not covered by insurance. She declined ACE/ARB and is on a statin. Eye exam: UTD. Foot exam: today. Urine micro: UTD. Diet and exercise.  -     Bayer DCA Hb A1c Waived -     empagliflozin (JARDIANCE) 10 MG TABS tablet; Take 1 tablet (10 mg total) by mouth daily before breakfast. -     Continuous Blood Gluc Sensor (FREESTYLE LIBRE 2 SENSOR) MISC; Use to test blood sugar continuously. Apply sensor to arm every 14 days.  DX: E11.65  Hyperlipidemia associated with type 2 diabetes mellitus ([place]) On statin. Diet and exercise. Last LDL 92.  Morbid obesity ([place]) Stable. Diet and exercise. Continue follow up with nutrition.   Fatty liver disease, nonalcoholic Established with GI. Will check LFTs today.  -     CMP14+EGFR  Gastroesophageal reflux disease, unspecified whether esophagitis  present Improved symptoms since stopping ozempic. Continue protonix. Follow up with GI as scheduled.   Return in about 3 months (around 08/22/2022) for chronic follow up.   The patient indicates understanding of these issues and agrees with the plan.  Gwenlyn Perking, FNP

## 2022-05-23 LAB — CMP14+EGFR
ALT: 36 IU/L — ABNORMAL HIGH (ref 0–32)
AST: 25 IU/L (ref 0–40)
Albumin/Globulin Ratio: 1.4 (ref 1.2–2.2)
Albumin: 4.2 g/dL (ref 4.0–5.0)
Alkaline Phosphatase: 81 IU/L (ref 44–121)
BUN/Creatinine Ratio: 15 (ref 9–23)
BUN: 14 mg/dL (ref 6–20)
Bilirubin Total: 0.3 mg/dL (ref 0.0–1.2)
CO2: 22 mmol/L (ref 20–29)
Calcium: 9.2 mg/dL (ref 8.7–10.2)
Chloride: 102 mmol/L (ref 96–106)
Creatinine, Ser: 0.93 mg/dL (ref 0.57–1.00)
Globulin, Total: 3 g/dL (ref 1.5–4.5)
Glucose: 155 mg/dL — ABNORMAL HIGH (ref 70–99)
Potassium: 4.6 mmol/L (ref 3.5–5.2)
Sodium: 139 mmol/L (ref 134–144)
Total Protein: 7.2 g/dL (ref 6.0–8.5)
eGFR: 87 mL/min/{1.73_m2} (ref >59)

## 2022-06-01 DIAGNOSIS — Z7984 Long term (current) use of oral hypoglycemic drugs: Secondary | ICD-10-CM | POA: Diagnosis not present

## 2022-06-01 DIAGNOSIS — K5900 Constipation, unspecified: Secondary | ICD-10-CM | POA: Diagnosis not present

## 2022-06-01 DIAGNOSIS — E119 Type 2 diabetes mellitus without complications: Secondary | ICD-10-CM | POA: Diagnosis not present

## 2022-06-03 ENCOUNTER — Telehealth: Payer: Self-pay

## 2022-06-03 NOTE — Transitions of Care (Post Inpatient/ED Visit) (Signed)
   06/03/2022  Name: Allison Friedman MRN: [id number] DOB: 10-27-1995  Today's TOC FU Call Status: Today's TOC FU Call Status:: Successful TOC FU Call Competed TOC FU Call Complete Date: 06/03/22  Transition Care Management Follow-up Telephone Call Date of Discharge: 06/01/22 Discharge Facility: Other ([facility]) Name of Other ([facility]) Discharge Facility: [facility] Type of Discharge: Emergency Department Reason for ED Visit: Other: (constipation) How have you been since you were released from the hospital?: Better Any questions or concerns?: No  Items Reviewed: Did you receive and understand the discharge instructions provided?: Yes Medications obtained and verified?: Yes (Medications Reviewed) Any new allergies since your discharge?: No Dietary orders reviewed?: Yes Do you have support at home?: Yes People in Home: spouse  Home Care and Equipment/Supplies: Were Home Health Services Ordered?: NA Any new equipment or medical supplies ordered?: NA  Functional Questionnaire: Do you need assistance with bathing/showering or dressing?: No Do you need assistance with meal preparation?: No Do you need assistance with eating?: No Do you have difficulty maintaining continence: No Do you need assistance with getting out of bed/getting out of a chair/moving?: No Do you have difficulty managing or taking your medications?: No  Follow up appointments reviewed: PCP Follow-up appointment confirmed?: No (no avail appt times, sent message to staff to schedule) Specialist Hospital Follow-up appointment confirmed?: NA Do you need transportation to your follow-up appointment?: No Do you understand care options if your condition(s) worsen?: Yes-patient verbalized understanding    SIGNATURE Karena Addison, LPN [facility] Nurse Health Advisor Direct Dial [phone]

## 2022-06-10 ENCOUNTER — Encounter: Payer: BC Managed Care – PPO | Attending: Family Medicine | Admitting: Nutrition

## 2022-06-10 VITALS — Ht 64.0 in | Wt 286.0 lb

## 2022-06-10 DIAGNOSIS — O24119 Pre-existing diabetes mellitus, type 2, in pregnancy, unspecified trimester: Secondary | ICD-10-CM

## 2022-06-10 DIAGNOSIS — Z6841 Body Mass Index (BMI) 40.0 and over, adult: Secondary | ICD-10-CM | POA: Diagnosis not present

## 2022-06-10 DIAGNOSIS — Z713 Dietary counseling and surveillance: Secondary | ICD-10-CM | POA: Diagnosis not present

## 2022-06-10 DIAGNOSIS — E1165 Type 2 diabetes mellitus with hyperglycemia: Secondary | ICD-10-CM | POA: Insufficient documentation

## 2022-06-10 NOTE — Progress Notes (Signed)
Medical Nutrition Therapy  Appointment Start time:  1130  Appointment End time:  1200  Primary concerns today: DM Type 2, Obesity  Referral diagnosis: E11.8, E66.01 Preferred learning style: No preference  Learning readiness: Ready    NUTRITION ASSESSMENT  Current BS is 271 mg/dl. Admits to needing to work on better sized portions. Skipping meals. Meals not well balanced. Diet is low in complex CHO and whole plant based foods. Not exercising. Hasn't been able to make a lot of changes since last visit. Hasn't been able to pick up Jardiance. Ozempic Lost only 1 lb. Anthropometrics  Wt Readings from Last 3 Encounters:  08/25/22 279 lb (126.6 kg)  08/01/22 280 lb (127 kg)  07/02/22 282 lb 4 oz (128 kg)   Ht Readings from Last 3 Encounters:  08/25/22 5\' 4"  (1.626 m)  08/01/22 5\' 4"  (1.626 m)  07/02/22 5\' 4"  (1.626 m)   Body mass index is 49.09 kg/m. @BMIFA @ Facility age limit for growth %iles is 20 years. Facility age limit for growth %iles is 20 years.     Clinical Medical Hx: See chart Medications: Ozmepic 2 g/d. Labs:  Lab Results  Component Value Date   HGBA1C 6.0 (H) 05/22/2022      Latest Ref Rng & Units 05/22/2022   11:10 AM 02/20/2022   10:50 AM 11/13/2021   10:42 AM  CMP  Glucose 70 - 99 mg/dL 161  096  045   BUN 6 - 20 mg/dL 14  8  14    Creatinine 0.57 - 1.00 mg/dL 4.09  8.11  9.14   Sodium 134 - 144 mmol/L 139  139  139   Potassium 3.5 - 5.2 mmol/L 4.6  4.2  4.4   Chloride 96 - 106 mmol/L 102  100  102   CO2 20 - 29 mmol/L 22  22  20    Calcium 8.7 - 10.2 mg/dL 9.2  9.5  9.0   Total Protein 6.0 - 8.5 g/dL 7.2  7.5  7.4   Total Bilirubin 0.0 - 1.2 mg/dL 0.3  <7.8  0.2   Alkaline Phos 44 - 121 IU/L 81  93  85   AST 0 - 40 IU/L 25  24  22    ALT 0 - 32 IU/L 36  45  37    Lipid Panel     Component Value Date/Time   CHOL 158 02/20/2022 1050   TRIG 188 (H) 02/20/2022 1050   HDL 34 (L) 02/20/2022 1050   CHOLHDL 4.6 (H) 02/20/2022 1050   LDLCALC 92  02/20/2022 1050   LABVLDL 32 02/20/2022 1050    Notable Signs/Symptoms: none  Lifestyle & Dietary Hx Lives with her husband. Stays at home with her daughter.  Has been on depression medicaiton Estimated daily fluid intake: 40 oz Supplements: None Sleep: good Stress / self-care: her health and PTSD Current average weekly physical activity: Walking some  24-Hr Dietary Recall B) still skipping.  L) Cheese quesidillia,  Water 2 cups strawberries. D) Chicken parmesean, spaghettti 2 cups, Water   Estimated Energy Needs Calories: 1200 Carbohydrate: 135g Protein: 90g Fat: 33g   NUTRITION DIAGNOSIS  NB-1.1 Food and nutrition-related knowledge deficit As related to high calorie high carb diet.  As evidenced by BMI 48 and history of eating high calorie foods..   NUTRITION INTERVENTION  Nutrition education (E-1) on the following topics:  Nutrition and Diabetes education provided on My Plate, CHO counting, meal planning, portion sizes, timing of meals, avoiding snacks between meals unless having  a low blood sugar, target ranges for A1C and blood sugars, signs/symptoms and treatment of hyper/hypoglycemia, monitoring blood sugars, taking medications as prescribed, benefits of exercising 30 minutes per day and prevention of complications of DM.  Lifestyle Medicine  - Whole Food, Plant Predominant Nutrition is highly recommended: Eat Plenty of vegetables, Mushrooms, fruits, Legumes, Whole Grains, Nuts, seeds in lieu of processed meats, processed snacks/pastries red meat, poultry, eggs.    -It is better to avoid simple carbohydrates including: Cakes, Sweet Desserts, Ice Cream, Soda (diet and regular), Sweet Tea, Candies, Chips, Cookies, Store Bought Juices, Alcohol in Excess of  1-2 drinks a day, Lemonade,  Artificial Sweeteners, Doughnuts, Coffee Creamers, "Sugar-free" Products, etc, etc.  This is not a complete list.....  Exercise: If you are able: 30 -60 minutes a day ,4 days a week, or  150 minutes a week.  The longer the better.  Combine stretch, strength, and aerobic activities.  If you were told in the past that you have high risk for cardiovascular diseases, you may seek evaluation by your heart doctor prior to initiating moderate to intense exercise programs.   Handouts Provided Include  Lifestyle Medicine   Learning Style & Readiness for Change Teaching method utilized: Visual & Auditory  Demonstrated degree of understanding via: Teach Back  Barriers to learning/adherence to lifestyle change: none  Goals Established by Pt   Drink a gallon of water per day-6 bottles of water Start work on portion control Increase low carb vegetables Walk 30 minutes a day Go pick up her Jardiance   MONITORING & EVALUATION Dietary intake, weekly physical activity, and weight  in 3 month.  Next Steps  Patient is to work on meal planning and eating more whole plant based foods.Marland Kitchen

## 2022-06-10 NOTE — Patient Instructions (Signed)
Goals  Drink a gallon of water per day-6 bottles of water Start work on portion control Increase low carb vegetables Walk 30 minutes a day Go pick up her [employer]

## 2022-06-11 ENCOUNTER — Ambulatory Visit: Payer: BC Managed Care – PPO | Admitting: Gastroenterology

## 2022-06-11 ENCOUNTER — Encounter: Payer: Self-pay | Admitting: *Deleted

## 2022-06-11 ENCOUNTER — Encounter: Payer: Self-pay | Admitting: Gastroenterology

## 2022-06-11 ENCOUNTER — Telehealth: Payer: Self-pay | Admitting: *Deleted

## 2022-06-11 VITALS — BP 103/72 | HR 71 | Temp 98.3°F | Ht 64.0 in | Wt 283.8 lb

## 2022-06-11 DIAGNOSIS — R1013 Epigastric pain: Secondary | ICD-10-CM

## 2022-06-11 DIAGNOSIS — K5900 Constipation, unspecified: Secondary | ICD-10-CM | POA: Diagnosis not present

## 2022-06-11 DIAGNOSIS — R1319 Other dysphagia: Secondary | ICD-10-CM | POA: Diagnosis not present

## 2022-06-11 DIAGNOSIS — K219 Gastro-esophageal reflux disease without esophagitis: Secondary | ICD-10-CM

## 2022-06-11 MED ORDER — LINACLOTIDE 145 MCG PO CAPS: 145.0000 ug | ORAL_CAPSULE | Freq: Every day | ORAL | 5 refills | Status: DC

## 2022-06-11 NOTE — Progress Notes (Signed)
GI Office Note    Referring Provider: Gabriel Earing, FNP Primary Care Physician:  Gabriel Earing, FNP  Primary Gastroenterologist: Hennie Duos. Marletta Lor, DO   Chief Complaint   Chief Complaint  Patient presents with   er follow up    Bowel obstruction. Still having issues with constipation.    History of Present Illness   Allison Friedman is a 27 y.o. female presenting today for follow up. Last seen 10/2021. H/o nonspecific elevation lipase, epigastric pain, fatty liver. Recommended EGD after last ov, not completed due to lack of childcare.  Seen in the ED at [facility] recently for concern for fecal impaction.  Abdominal x-ray without evidence of obstruction.  According to the ED report, digital disimpaction was performed followed by soapsuds enema which resulted in a bowel movement spontaneously afterwards.  Patient reports also taking magnesium citrate.  Patient advised to increase dietary fiber, use MiraLAX.  Patient states that she took the Linzess 145 mcg samples after her last office visit in September.  They seem to help but she never got around to pick up the prescription.  Continues to struggle having regular bowel movements.  Passed some bright red blood per rectum with recent impaction but otherwise does not see blood in the stool.  Still having a lot of heartburn and regurgitation.  Taking pantoprazole 20 mg twice daily.  Takes Carafate at times when needed.  Complaining more of solid food dysphagia, happens no matter what she eats.  Chewing food very thoroughly.  No odynophagia.  No early satiety.  Recently titrated up to Ozempic 2 mg weekly.     Abd 2 view/chest 1 view 05/2022: 1. Nonobstructive bowel gas pattern. Moderate volume stool within  the colon.  2. No acute cardiopulmonary disease.   While in the ED at [facility] on September 30, 2021  Lipase minimally elevated at 141.  White blood cell count 9900, hemoglobin 14.1, platelets 268,000, sodium 140,  potassium 4.7, BUN 13, creatinine 0.93, total bilirubin 0.2, alkaline phosphatase 93, AST 25, ALT 47,  Upper quadrant ultrasound September 30, 2021: IMPRESSION:  1. The echogenicity of the liver is mildly increased. This is a  nonspecific finding but is most commonly seen with fatty  infiltration of the liver. There are no obvious focal liver lesions.  2. Unremarkable gallbladder.         Current Outpatient Medications  Medication Sig Dispense Refill   atorvastatin (LIPITOR) 20 MG tablet Take 1 tablet (20 mg total) by mouth daily. 90 tablet 3   Continuous Blood Gluc Sensor (FREESTYLE LIBRE 2 SENSOR) MISC Use to test blood sugar continuously. Apply sensor to arm every 14 days.  DX: E11.65 2 each 5   empagliflozin (JARDIANCE) 10 MG TABS tablet Take 1 tablet (10 mg total) by mouth daily before breakfast. 90 tablet 3   pantoprazole (PROTONIX) 20 MG tablet Take 1 tablet (20 mg total) by mouth 2 (two) times daily before a meal. 180 tablet 3   PARAGARD INTRAUTERINE COPPER IU by Intrauterine route.     Semaglutide, 2 MG/DOSE, (OZEMPIC, 2 MG/DOSE,) 8 MG/3ML SOPN Inject 2 mg into the skin once a week. 9 mL 3   sucralfate (CARAFATE) 1 g tablet Take 1 g by mouth 4 (four) times daily.     No current facility-administered medications for this visit.    Allergies   Allergies as of 06/11/2022 - Review Complete 06/11/2022  Allergen Reaction Noted   Metformin and related Diarrhea 08/29/2021  Prozac [fluoxetine] Other (See Comments) 08/29/2021   Xanax [alprazolam] Other (See Comments) 08/29/2021     Past Medical History   Past Medical History:  Diagnosis Date   Anemia    Anxiety    Childhood asthma    Depression    Diabetes mellitus without complication    Palpitations     Past Surgical History   Past Surgical History:  Procedure Laterality Date   CESAREAN SECTION N/A 05/08/2021   Procedure: CESAREAN SECTION;  Surgeon: Toy Baker, DO;  Location: [facility] LD ORS;  Service: Obstetrics;   Laterality: N/A;   CYST REMOVAL NECK     infected sweat gland - cyst removal on the chest   NECK SURGERY      Past Family History   Family History  Problem Relation Age of Onset   Depression Mother    Arthritis Mother    Anxiety disorder Mother    Diabetes Mother    Hypertension Mother    Diabetes Father    Alcohol abuse Father    Hypertension Father    Congenital Murmur Father    Heart attack Father        late 39's or early 83's   Hearing loss Brother        born deaf   Asthma Brother    Colon cancer Maternal Grandfather     Past Social History   Social History   Socioeconomic History   Marital status: Married    Spouse name: Reuel Boom   Number of children: 1   Years of education: 12   Highest education level: High school graduate  Occupational History   Not on file  Tobacco Use   Smoking status: Never   Smokeless tobacco: Never  Vaping Use   Vaping Use: Never used  Substance and Sexual Activity   Alcohol use: Never   Drug use: Never   Sexual activity: Yes    Birth control/protection: I.U.D.    Comment: Paraguard  Other Topics Concern   Not on file  Social History Narrative   Not on file   Social Determinants of Health   Financial Resource Strain: Not on file  Food Insecurity: Not on file  Transportation Needs: Not on file  Physical Activity: Not on file  Stress: Not on file  Social Connections: Not on file  Intimate Partner Violence: Not on file    Review of Systems   General: Negative for anorexia, weight loss, fever, chills, fatigue, weakness. ENT: Negative for hoarseness, nasal congestion. See hpi CV: Negative for chest pain, angina, palpitations, dyspnea on exertion, peripheral edema.  Respiratory: Negative for dyspnea at rest, dyspnea on exertion, cough, sputum, wheezing.  GI: See history of present illness. GU:  Negative for dysuria, hematuria, urinary incontinence, urinary frequency, nocturnal urination.  Endo: Negative for unusual  weight change.     Physical Exam   BP 103/72 (BP Location: Right Arm, Patient Position: Sitting, Cuff Size: Large)   Pulse 71   Temp 98.3 F (36.8 C) (Oral)   Ht  (1.626 m)   Wt 283 lb 12.8 oz (128.7 kg)   LMP  (LMP Unknown)   Breastfeeding No   BMI 48.71 kg/m    General: Well-nourished, well-developed in no acute distress.  Eyes: No icterus. Mouth: Oropharyngeal mucosa moist and pink   Lungs: Clear to auscultation bilaterally.  Heart: Regular rate and rhythm, no murmurs rubs or gallops.  Abdomen: Bowel sounds are normal, nondistended, no hepatosplenomegaly or masses,  no abdominal bruits  or hernia , no rebound or guarding. Mild epig tenderness Rectal: not performed Extremities: No lower extremity edema. No clubbing or deformities. Neuro: Alert and oriented x 4   Skin: Warm and dry, no jaundice.   Psych: Alert and cooperative, normal mood and affect.  Labs   Lab Results  Component Value Date   CREATININE 0.93 05/22/2022   BUN 14 05/22/2022   NA 139 05/22/2022   K 4.6 05/22/2022   CL 102 05/22/2022   CO2 22 05/22/2022   Lab Results  Component Value Date   ALT 36 (H) 05/22/2022   AST 25 05/22/2022   ALKPHOS 81 05/22/2022   BILITOT 0.3 05/22/2022   Lab Results  Component Value Date   LIPASE 73 (H) 10/07/2021    Imaging Studies   No results found.  Assessment/Plan:   GERD/dysphagia/epigastric pain: poorly controlled, likely exacerbated by use of ozempic. Symptoms associated with solid food dysphagia, epigastric pain. Recommend EGD/ED to evaluate for complicated GERD, gastritis, ulcers. Will continue pantoprazole 20mg  BID for now but we may have to increase dosage based on EGD findings.   Constipation: recent ED evaluation for fecal impaction. Previously tried samples of Linzess and found effective but never had the prescription filled. Interested din trying again. RX provided.  Elevated lipase: minimally elevated twice. Nonspecific findings and symptoms  not typical of pancreatitis.      Leanna Battles. Melvyn Neth, MHS, PA-C [facility]

## 2022-06-11 NOTE — Telephone Encounter (Signed)
Carelon PA: Order ID: [id number]       Authorized  Approval Valid Through: 06/11/2022 - 08/09/2022

## 2022-06-11 NOTE — Patient Instructions (Addendum)
Start Linzess daily before breakfast. You can look online and see if you qualify for cost savings card from Linzess. Please let us know if you are unable to obtain medication for any reason and we will look for another option. Continue pantoprazole 20 mg twice daily before meals. Upper endoscopy to be scheduled.  See separate instructions when to hold your diabetic medications.

## 2022-06-13 ENCOUNTER — Ambulatory Visit: Payer: BC Managed Care – PPO | Admitting: Family Medicine

## 2022-06-13 ENCOUNTER — Encounter: Payer: Self-pay | Admitting: Family Medicine

## 2022-06-13 VITALS — BP 101/63 | HR 67 | Temp 97.8°F | Ht 64.0 in | Wt 282.2 lb

## 2022-06-13 DIAGNOSIS — E1165 Type 2 diabetes mellitus with hyperglycemia: Secondary | ICD-10-CM | POA: Diagnosis not present

## 2022-06-13 DIAGNOSIS — G43709 Chronic migraine without aura, not intractable, without status migrainosus: Secondary | ICD-10-CM | POA: Diagnosis not present

## 2022-06-13 MED ORDER — TOPIRAMATE 50 MG PO TABS
50.0000 mg | ORAL_TABLET | Freq: Every evening | ORAL | 3 refills | Status: DC | PRN
Start: 2022-06-13 — End: 2022-12-03

## 2022-06-13 MED ORDER — OZEMPIC (2 MG/DOSE) 8 MG/3ML ~~LOC~~ SOPN: 2.0000 mg | PEN_INJECTOR | SUBCUTANEOUS | 3 refills | Status: DC

## 2022-06-13 MED ORDER — EMPAGLIFLOZIN 10 MG PO TABS
10.0000 mg | ORAL_TABLET | Freq: Every day | ORAL | 3 refills | Status: DC
Start: 2022-06-13 — End: 2022-06-13

## 2022-06-13 MED ORDER — SUMATRIPTAN SUCCINATE 25 MG PO TABS
25.0000 mg | ORAL_TABLET | ORAL | 11 refills | Status: DC | PRN
Start: 2022-06-13 — End: 2022-08-25

## 2022-06-13 NOTE — Progress Notes (Signed)
Acute Office Visit  Subjective:     Patient ID: Allison Friedman, female    DOB: [birth year], 27 y.o.   MRN: [id number]  Chief Complaint  Patient presents with   Migraine    Migraine    Patient is in today for a migraines. This has been a chronic issue but has been worsening recently. She reports that she now has >15 migraines a month. She reports that they are usually unilateral, unusually on the right side of her face. It is a stabbing pain. The pain is severe at times. She sometimes has sensitivity to light and noise. She also has nausea and occasional vomiting. She has tried ice, a darkened room, advil, tylenol, and Excedrin with minimal improvement.   She would like to restart on ozempic. Her blood sugars were around 300. She restarted it last week and now blood sugars are <150. Jardiance was too expensive for her to afford.   ROS As per HPI.      Objective:    BP 101/63   Pulse 67   Temp 97.8 F (36.6 C) (Temporal)   Ht  (1.626 m)   Wt 282 lb 4 oz (128 kg)   LMP  (LMP Unknown)   SpO2 98%   BMI 48.45 kg/m    Physical Exam Vitals and nursing note reviewed.  Constitutional:      General: She is not in acute distress.    Appearance: She is obese. She is not ill-appearing, toxic-appearing or diaphoretic.  HENT:     Head: Normocephalic and atraumatic.  Eyes:     Extraocular Movements: Extraocular movements intact.     Conjunctiva/sclera: Conjunctivae normal.     Pupils: Pupils are equal, round, and reactive to light.  Cardiovascular:     Rate and Rhythm: Normal rate and regular rhythm.     Heart sounds: Normal heart sounds. No murmur heard. Pulmonary:     Effort: Pulmonary effort is normal. No respiratory distress.     Breath sounds: Normal breath sounds. No wheezing.  Musculoskeletal:     Cervical back: Neck supple. No rigidity.     Right lower leg: No edema.     Left lower leg: No edema.  Skin:    General: Skin is warm and dry.  Neurological:      General: No focal deficit present.     Mental Status: She is alert and oriented to person, place, and time.     Cranial Nerves: No cranial nerve deficit.     Motor: No weakness.     Coordination: Coordination normal.     Gait: Gait normal.  Psychiatric:        Mood and Affect: Mood normal.        Behavior: Behavior normal.     No results found for any visits on 06/13/22.      Assessment & Plan:   Allison Friedman was seen today for migraine.  Diagnoses and all orders for this visit:  Chronic migraine w/o aura w/o status migrainosus, not intractable Uncontrolled. Start topamax. Aware to take 25 mg nightly x 1 week, then increase to 50 mg. Imitrex prn. Discussed symptomatic care and return precautions. She will contact me for side effects or if no improvement after 4 weeks.  -     topiramate (TOPAMAX) 50 MG tablet; Take 1 tablet (50 mg total) by mouth at bedtime as needed. -     SUMAtriptan (IMITREX) 25 MG tablet; Take 1 tablet (25 mg total) by mouth every  2 (two) hours as needed for migraine. May repeat in 2 hours if headache persists or recurs.  Type 2 diabetes mellitus with hyperglycemia, without long-term current use of insulin Discontinue jardiance due to cost. Continue ozempic. Discussed side effects or worsened GERD and constipation with ozempic and management.  -     Discontinue: empagliflozin (JARDIANCE) 10 MG TABS tablet; Take 1 tablet (10 mg total) by mouth daily before breakfast. -     Semaglutide, 2 MG/DOSE, (OZEMPIC, 2 MG/DOSE,) 8 MG/3ML SOPN; Inject 2 mg into the skin once a week.   Keep scheduled follow up. Sooner for new or worsening symptoms.   The patient indicates understanding of these issues and agrees with the plan.  Allison Earing, FNP

## 2022-07-03 ENCOUNTER — Telehealth: Payer: Self-pay | Admitting: *Deleted

## 2022-07-03 ENCOUNTER — Encounter (HOSPITAL_COMMUNITY)
Admission: RE | Admit: 2022-07-03 | Discharge: 2022-07-03 | Disposition: A | Discharge status: Home / Self Care | Payer: BC Managed Care – PPO | Source: Ambulatory Visit | Attending: Internal Medicine | Admitting: Internal Medicine

## 2022-07-03 ENCOUNTER — Encounter: Payer: Self-pay | Admitting: *Deleted

## 2022-07-03 VITALS — Ht 64.0 in | Wt 282.3 lb

## 2022-07-03 DIAGNOSIS — Z01818 Encounter for other preprocedural examination: Secondary | ICD-10-CM

## 2022-07-03 NOTE — Telephone Encounter (Signed)
Reschedule when patient ready. She was given appropriate instructions on holding ozempic and jardiance.

## 2022-07-03 NOTE — Progress Notes (Signed)
Patient procedure for EGD/ED for 07/07/22 will be cancelled.  Patient did not stop Ozempic in time for procedure.

## 2022-07-03 NOTE — Telephone Encounter (Signed)
Sent message to pt via MyChart to let me know when she is ready to reschedule.

## 2022-07-03 NOTE — Telephone Encounter (Signed)
Cancellation Received: Today Jethro Bolus, RN  Letta Median, Ewell Poe, CMA; Lanelle Bal, DO Patient procedure for EGD/ED for 07/07/22 will be cancelled.  Patient did not stop Ozempic in time for procedure.

## 2022-07-07 ENCOUNTER — Encounter (HOSPITAL_COMMUNITY): Admission: RE | Payer: Self-pay | Source: Home / Self Care

## 2022-07-07 ENCOUNTER — Ambulatory Visit (HOSPITAL_COMMUNITY): Admission: RE | Admit: 2022-07-07 | Payer: BC Managed Care – PPO | Source: Home / Self Care

## 2022-07-07 SURGERY — ESOPHAGOGASTRODUODENOSCOPY (EGD) WITH PROPOFOL
Anesthesia: Monitor Anesthesia Care

## 2022-08-01 ENCOUNTER — Ambulatory Visit (INDEPENDENT_AMBULATORY_CARE_PROVIDER_SITE_OTHER): Payer: BC Managed Care – PPO | Admitting: Nurse Practitioner

## 2022-08-01 ENCOUNTER — Encounter: Payer: Self-pay | Admitting: Nurse Practitioner

## 2022-08-01 VITALS — BP 103/73 | HR 83 | Temp 98.0°F | Resp 20 | Ht 64.0 in | Wt 280.0 lb

## 2022-08-01 DIAGNOSIS — Z30432 Encounter for removal of intrauterine contraceptive device: Secondary | ICD-10-CM | POA: Diagnosis not present

## 2022-08-01 NOTE — Progress Notes (Signed)
Subjective:    Patient ID: Allison Friedman, female    DOB: [birth year], 27 y.o.   MRN: [id number]   Chief Complaint: Remove IUD   HPI  Patient had IUD inserted at [facility]. She had it inserted in March of '23. She says string pokes her and her husband and she just wants it out. Patient does not want any birth control at this time.  Patient Active Problem List   Diagnosis Date Noted   Chronic migraine w/o aura w/o status migrainosus, not intractable 06/13/2022   Esophageal dysphagia 06/11/2022   Hyperlipidemia associated with type 2 diabetes mellitus (HCC) 05/22/2022   Fatty liver disease, nonalcoholic 10/25/2021   Constipation 10/25/2021   Dysphagia 10/25/2021   GERD (gastroesophageal reflux disease) 10/25/2021   Abdominal pain, epigastric 10/25/2021   Elevated lipase 10/25/2021   Generalized anxiety disorder 08/08/2021   Depression, recurrent (HCC) 08/08/2021   Morbid obesity (HCC) 08/08/2021   Type 2 diabetes mellitus 05/09/2021   Status post primary low transverse cesarean section 3/15 - AOD 4 cm 05/09/2021   Anxiety and depression 05/09/2021       Review of Systems  Constitutional:  Negative for diaphoresis.  Eyes:  Negative for pain.  Respiratory:  Negative for shortness of breath.   Cardiovascular:  Negative for chest pain, palpitations and leg swelling.  Gastrointestinal:  Negative for abdominal pain.  Endocrine: Negative for polydipsia.  Skin:  Negative for rash.  Neurological:  Negative for dizziness, weakness and headaches.  Hematological:  Does not bruise/bleed easily.  All other systems reviewed and are negative.      Objective:   Physical Exam Vitals and nursing note reviewed.  Constitutional:      General: She is not in acute distress.    Appearance: Normal appearance. She is well-developed.  Neck:     Vascular: No carotid bruit or JVD.  Cardiovascular:     Rate and Rhythm: Normal rate and regular rhythm.     Heart sounds: Normal heart  sounds.  Pulmonary:     Effort: Pulmonary effort is normal. No respiratory distress.     Breath sounds: Normal breath sounds. No wheezing or rales.  Chest:     Chest wall: No tenderness.  Abdominal:     General: There is no distension or abdominal bruit.     Palpations: There is no hepatomegaly, splenomegaly, mass or pulsatile mass.     Tenderness: There is no abdominal tenderness.  Genitourinary:    General: Normal vulva.     Vagina: No vaginal discharge.     Rectum: Normal.     Comments: Mirena removed with ring forceps- intact post removal Musculoskeletal:        General: Normal range of motion.     Cervical back: Normal range of motion and neck supple.  Lymphadenopathy:     Cervical: No cervical adenopathy.  Skin:    General: Skin is warm and dry.  Neurological:     Mental Status: She is alert and oriented to person, place, and time.     Deep Tendon Reflexes: Reflexes are normal and symmetric.  Psychiatric:        Behavior: Behavior normal.        Thought Content: Thought content normal.        Judgment: Judgment normal.    BP 103/73   Pulse 83   Temp 98 F (36.7 C) (Temporal)   Resp 20   Ht 5\' 4"  (1.626 m)   Wt 280 lb (127 kg)  SpO2 97%   BMI 48.06 kg/m         Assessment & Plan:   Zack Seal in today with chief complaint of Remove IUD   1. Encounter for IUD removal Slight spotting may occur.  Menses may not start for several weeks    The above assessment and management plan was discussed with the patient. The patient verbalized understanding of and has agreed to the management plan. Patient is aware to call the clinic if symptoms persist or worsen. Patient is aware when to return to the clinic for a follow-up visit. Patient educated on when it is appropriate to go to the emergency department.   Mary-Margaret Daphine Deutscher, FNP

## 2022-08-25 ENCOUNTER — Ambulatory Visit: Payer: BC Managed Care – PPO | Admitting: Family Medicine

## 2022-08-25 ENCOUNTER — Encounter: Payer: Self-pay | Admitting: Family Medicine

## 2022-08-25 VITALS — BP 118/72 | HR 85 | Temp 98.4°F | Ht 64.0 in | Wt 279.0 lb

## 2022-08-25 DIAGNOSIS — E1169 Type 2 diabetes mellitus with other specified complication: Secondary | ICD-10-CM

## 2022-08-25 DIAGNOSIS — E785 Hyperlipidemia, unspecified: Secondary | ICD-10-CM

## 2022-08-25 DIAGNOSIS — K760 Fatty (change of) liver, not elsewhere classified: Secondary | ICD-10-CM | POA: Diagnosis not present

## 2022-08-25 DIAGNOSIS — E1165 Type 2 diabetes mellitus with hyperglycemia: Secondary | ICD-10-CM

## 2022-08-25 DIAGNOSIS — Z6841 Body Mass Index (BMI) 40.0 and over, adult: Secondary | ICD-10-CM

## 2022-08-25 DIAGNOSIS — Z7985 Long-term (current) use of injectable non-insulin antidiabetic drugs: Secondary | ICD-10-CM

## 2022-08-25 DIAGNOSIS — G43709 Chronic migraine without aura, not intractable, without status migrainosus: Secondary | ICD-10-CM | POA: Diagnosis not present

## 2022-08-25 DIAGNOSIS — K5909 Other constipation: Secondary | ICD-10-CM

## 2022-08-25 DIAGNOSIS — F411 Generalized anxiety disorder: Secondary | ICD-10-CM

## 2022-08-25 DIAGNOSIS — F339 Major depressive disorder, recurrent, unspecified: Secondary | ICD-10-CM

## 2022-08-25 DIAGNOSIS — Z319 Encounter for procreative management, unspecified: Secondary | ICD-10-CM

## 2022-08-25 LAB — BAYER DCA HB A1C WAIVED: HB A1C (BAYER DCA - WAIVED): 6.4 % — ABNORMAL HIGH (ref 4.8–5.6)

## 2022-08-25 MED ORDER — ATORVASTATIN CALCIUM 20 MG PO TABS: 20.0000 mg | ORAL_TABLET | Freq: Every day | ORAL | 3 refills | Status: DC

## 2022-08-25 MED ORDER — SUMATRIPTAN SUCCINATE 25 MG PO TABS
25.0000 mg | ORAL_TABLET | ORAL | 11 refills | Status: DC | PRN
Start: 2022-08-25 — End: 2022-12-03

## 2022-08-25 MED ORDER — LINACLOTIDE 145 MCG PO CAPS
145.0000 ug | ORAL_CAPSULE | Freq: Every day | ORAL | 5 refills | Status: DC
Start: 2022-08-25 — End: 2023-07-17

## 2022-08-25 NOTE — Progress Notes (Signed)
Established Patient Office Visit  Subjective   Patient ID: Allison Friedman, female    DOB: 20-May-1995  Age: 27 y.o. MRN: [id number]  Chief Complaint  Patient presents with   Medical Management of Chronic Issues    3 month    HPI T2DM Pt presents for follow up evaluation of Type 2 diabetes mellitus. Patient denies foot ulcerations, increased appetite, nausea, paresthesia of the feet, polydipsia, polyuria, visual disturbances, vomiting, and weight loss.  Current diabetic medications include ozempic 2 mg.   Current diet: regular Current exercise: none  Is She on ACE inhibitor or angiotensin II receptor blocker?  No, patient declined Is She on statin? Yes atorvastatin  2. GERD Compliant with medications - Yes Current medications - protonix Voice change - No Hemoptysis - No Dysphagia or dyspepsia - No Water brash - Yes Red Flags (weight loss, hematochezia, melena, weight loss, early satiety, fevers, odynophagia, or persistent vomiting) - No  4. Desire pregnancy Just had IUD removed and desire pregnancy. She is no longer established with her Ob and needs referral to [facility] location. Reports ?PCOS and has irregular cycles. LMP 08/12/22.   5. Depression/anxiety    08/01/2022    3:03 PM 06/13/2022    9:41 AM 05/22/2022   11:06 AM  Depression screen PHQ 2/9  Decreased Interest 0 0 0  Down, Depressed, Hopeless 0 1 1  PHQ - 2 Score 0 1 1  Altered sleeping 2 2 0  Tired, decreased energy 2 2 2   Change in appetite 2 2 2   Feeling bad or failure about yourself  0 0 0  Trouble concentrating 0 0 0  Moving slowly or fidgety/restless 0 0 0  Suicidal thoughts 0 0 0  PHQ-9 Score 6 7 5   Difficult doing work/chores Not difficult at all Not difficult at all Not difficult at all      08/01/2022    3:04 PM 06/13/2022    9:44 AM 05/22/2022   11:06 AM 04/03/2022   10:12 AM  GAD 7 : Generalized Anxiety Score  Nervous, Anxious, on Edge 1 2 2 1   Control/stop worrying 0 0 1 0  Worry too much -  different things 1 2 1 1   Trouble relaxing 0 1 1 1   Restless 0 0 0 0  Easily annoyed or irritable 2 2 2 2   Afraid - awful might happen 0 0 0 0  Total GAD 7 Score 4 7 7 5   Anxiety Difficulty Not difficult at all Not difficult at all Not difficult at all Not difficult at all       ROS As per HPI.    Objective:     BP 118/72   Pulse 85   Temp 98.4 F (36.9 C)   Ht 5\' 4"  (1.626 m)   Wt 279 lb (126.6 kg)   LMP 08/09/2022   SpO2 97%   Breastfeeding No   BMI 47.89 kg/m  Wt Readings from Last 3 Encounters:  08/25/22 279 lb (126.6 kg)  08/01/22 280 lb (127 kg)  07/02/22 282 lb 4 oz (128 kg)      Physical Exam Vitals and nursing note reviewed.  Constitutional:      General: She is not in acute distress.    Appearance: She is obese. She is not ill-appearing, toxic-appearing or diaphoretic.  Cardiovascular:     Rate and Rhythm: Normal rate and regular rhythm.     Heart sounds: Normal heart sounds. No murmur heard. Pulmonary:     Effort:  Pulmonary effort is normal. No respiratory distress.     Breath sounds: Normal breath sounds.  Abdominal:     General: Bowel sounds are normal. There is no distension.     Palpations: Abdomen is soft.     Tenderness: There is no abdominal tenderness. There is no guarding or rebound.  Musculoskeletal:     Cervical back: Neck supple. No rigidity.     Right lower leg: No edema.     Left lower leg: No edema.  Skin:    General: Skin is warm and dry.  Neurological:     General: No focal deficit present.     Mental Status: She is alert and oriented to person, place, and time.  Psychiatric:        Mood and Affect: Mood normal.        Behavior: Behavior normal.     No results found for any visits on 08/25/22.    The ASCVD Risk score (Arnett DK, et al., 2019) failed to calculate for the following reasons:   The 2019 ASCVD risk score is only valid for ages 28 to 5    Assessment & Plan:   Allison Friedman was seen today for medical  management of chronic issues.  Diagnoses and all orders for this visit:  Type 2 diabetes mellitus with hyperglycemia, without long-term current use of insulin (HCC) A1c 6.4 today, at goal of <7. Medication changes today: none. She has declined ACE/ARB and she is on a statin. Eye exam: reminded to schedule. Foot exam: UTD. Urine micro: UTD. Diet and exercise.  -     Bayer DCA Hb A1c Waived  Hyperlipidemia associated with type 2 diabetes mellitus (HCC) Continue statin. Diet, exercise, weight loss.  -     atorvastatin (LIPITOR) 20 MG tablet; Take 1 tablet (20 mg total) by mouth daily.  Fatty liver disease, nonalcoholic Labs pending.  -     [id number]  Morbid obesity (HCC) Diet, exercise. Stable weight.   Chronic migraine w/o aura w/o status migrainosus, not intractable -     SUMAtriptan (IMITREX) 25 MG tablet; Take 1 tablet (25 mg total) by mouth every 2 (two) hours as needed for migraine. May repeat in 2 hours if headache persists or recurs.  Depression, recurrent (HCC) Generalized anxiety disorder Well controlled.   Patient desires pregnancy Referral to Ob. Discussed that she will need to discontinue crestor, ozempic, linzess, immitrex, and topamax as these are contraindicated in pregnancy. She verbalizes understanding and will discontinue for possible pregnancy. Discussed need to wean off topamax.  -     Ambulatory referral to Obstetrics / Gynecology  Chronic constipation -     linaclotide (LINZESS) 145 MCG CAPS capsule; Take 1 capsule (145 mcg total) by mouth daily before breakfast.    Return in about 3 months (around 11/25/2022) for chronic follow up.   The patient indicates understanding of these issues and agrees with the plan.  Gabriel Earing, FNP

## 2022-08-26 LAB — CMP14+EGFR
ALT: 27 IU/L (ref 0–32)
AST: 18 IU/L (ref 0–40)
Albumin: 4.2 g/dL (ref 4.0–5.0)
Alkaline Phosphatase: 82 IU/L (ref 44–121)
BUN/Creatinine Ratio: 17 (ref 9–23)
BUN: 13 mg/dL (ref 6–20)
Bilirubin Total: <0.2 mg/dL (ref 0.0–1.2)
CO2: 23 mmol/L (ref 20–29)
Calcium: 8.9 mg/dL (ref 8.7–10.2)
Chloride: 105 mmol/L (ref 96–106)
Creatinine, Ser: 0.78 mg/dL (ref 0.57–1.00)
Globulin, Total: 3 g/dL (ref 1.5–4.5)
Glucose: 92 mg/dL (ref 70–99)
Potassium: 4.5 mmol/L (ref 3.5–5.2)
Sodium: 140 mmol/L (ref 134–144)
Total Protein: 7.2 g/dL (ref 6.0–8.5)
eGFR: 107 mL/min/{1.73_m2} (ref >59)

## 2022-09-02 DIAGNOSIS — N926 Irregular menstruation, unspecified: Secondary | ICD-10-CM | POA: Diagnosis not present

## 2022-09-02 DIAGNOSIS — R5383 Other fatigue: Secondary | ICD-10-CM | POA: Diagnosis not present

## 2022-09-17 ENCOUNTER — Encounter: Payer: Self-pay | Admitting: Nutrition

## 2022-09-29 ENCOUNTER — Encounter: Payer: BC Managed Care – PPO | Attending: Family Medicine | Admitting: Nutrition

## 2022-09-29 VITALS — Ht 64.0 in | Wt 277.4 lb

## 2022-09-29 DIAGNOSIS — O24119 Pre-existing diabetes mellitus, type 2, in pregnancy, unspecified trimester: Secondary | ICD-10-CM | POA: Diagnosis not present

## 2022-09-29 NOTE — Patient Instructions (Signed)
Goalsl  Contact BH therapist Try the cauliflower rice Walk 30 minutes with baby in stroller 4 times per week Test blood sugars a few times in am and pm weekly. Contact insurance company about a Dexcom and or ask why the copay to the [place] is changing. Lose 2 -3 lbs per month Get A1C to 5.7%.

## 2022-09-29 NOTE — Progress Notes (Unsigned)
Medical Nutrition Therapy  Appointment Start time:  1130  Appointment End time:  1200  Primary concerns today: DM Type 2, Obesity  Referral diagnosis: E11.8, E66.01 Preferred learning style: No preference  Learning readiness: Ready    NUTRITION ASSESSMENT Follow up She is complaining of her CGM-Libre falling off and she can't afford it- too expensive. Testing randomly. Last A1C 6.4%. FBS: 90-115 mg/dl     Bedtime: 914-782 mg/dl. On Ozmepic 2.0 mg/dl.  Has been on 2.0 about 6 momths. .Not taking Jardiance. Since starting Ozempic only has lost 10 lbs.  Changes in eating habits.:  Only drinking water, Eating more fruits and vegeables. Eating more lean meats- [place] or chicken. Eating more fruits and vegetables. Is a little more  activity chasing daughter.  Is wanting to get in touch with a therapist to help her with the stress and mental health issues that she is dealing with. Gave her [place] number to reach out to.  BS are much improved. She feels better. Still having issues of emotional eatig etc   Current BS is 271 mg/dl. Admits to needing to work on better sized portions. Skipping meals. Meals not well balanced. Diet is low in complex CHO and whole plant based foods. Not exercising. Hasn't been able to make a lot of changes since last visit. Hasn't been able to pick up Jardiance. Ozempic Lost only 1 lb. Anthropometrics  Wt Readings from Last 3 Encounters:  08/25/22 279 lb (126.6 kg)  08/01/22 280 lb (127 kg)  07/02/22 282 lb 4 oz (128 kg)   Ht Readings from Last 3 Encounters:  08/25/22 5\' 4"  (1.626 m)  08/01/22 5\' 4"  (1.626 m)  07/02/22 5\' 4"  (1.626 m)   There is no height or weight on file to calculate BMI. @BMIFA @ Facility age limit for growth %iles is 20 years. Facility age limit for growth %iles is 20 years.     Clinical Medical Hx: See chart Medications: Ozmepic 2 g/d. Labs:  Lab Results  Component Value Date   HGBA1C 6.4 (H) 08/25/2022       Latest Ref Rng & Units 08/25/2022    3:05 PM 05/22/2022   11:10 AM 02/20/2022   10:50 AM  CMP  Glucose 70 - 99 mg/dL 92  956  213   BUN 6 - 20 mg/dL 13  14  8    Creatinine 0.57 - 1.00 mg/dL 0.86  5.78  4.69   Sodium 134 - 144 mmol/L 140  139  139   Potassium 3.5 - 5.2 mmol/L 4.5  4.6  4.2   Chloride 96 - 106 mmol/L 105  102  100   CO2 20 - 29 mmol/L 23  22  22    Calcium 8.7 - 10.2 mg/dL 8.9  9.2  9.5   Total Protein 6.0 - 8.5 g/dL 7.2  7.2  7.5   Total Bilirubin 0.0 - 1.2 mg/dL <6.2  0.3  <9.5   Alkaline Phos 44 - 121 IU/L 82  81  93   AST 0 - 40 IU/L 18  25  24    ALT 0 - 32 IU/L 27  36  45    Lipid Panel     Component Value Date/Time   CHOL 158 02/20/2022 1050   TRIG 188 (H) 02/20/2022 1050   HDL 34 (L) 02/20/2022 1050   CHOLHDL 4.6 (H) 02/20/2022 1050   LDLCALC 92 02/20/2022 1050   LABVLDL 32 02/20/2022 1050    Notable Signs/Symptoms: none  Lifestyle &  Dietary Hx Lives with her husband. Stays at home with her daughter.  Has been on depression medicaiton Estimated daily fluid intake: 40 oz Supplements: None Sleep: good Stress / self-care: her health and PTSD Current average weekly physical activity: Walking some  24-Hr Dietary Recall B) skipped  L)  lean chicken, veggies, strawberries. D)Fish-bake, 1 cup rice, broccoli,   Estimated Energy Needs Calories: 1200 Carbohydrate: 135g Protein: 90g Fat: 33g   NUTRITION DIAGNOSIS  NB-1.1 Food and nutrition-related knowledge deficit As related to high calorie high carb diet.  As evidenced by BMI 48 and history of eating high calorie foods..   NUTRITION INTERVENTION  Nutrition education (E-1) on the following topics:  Nutrition and Diabetes education provided on My Plate, CHO counting, meal planning, portion sizes, timing of meals, avoiding snacks between meals unless having a low blood sugar, target ranges for A1C and blood sugars, signs/symptoms and treatment of hyper/hypoglycemia, monitoring blood sugars, taking  medications as prescribed, benefits of exercising 30 minutes per day and prevention of complications of DM.  Lifestyle Medicine  - Whole Food, Plant Predominant Nutrition is highly recommended: Eat Plenty of vegetables, Mushrooms, fruits, Legumes, Whole Grains, Nuts, seeds in lieu of processed meats, processed snacks/pastries red meat, poultry, eggs.    -It is better to avoid simple carbohydrates including: Cakes, Sweet Desserts, Ice Cream, Soda (diet and regular), Sweet Tea, Candies, Chips, Cookies, Store Bought Juices, Alcohol in Excess of  1-2 drinks a day, Lemonade,  Artificial Sweeteners, Doughnuts, Coffee Creamers, "Sugar-free" Products, etc, etc.  This is not a complete list.....  Exercise: If you are able: 30 -60 minutes a day ,4 days a week, or 150 minutes a week.  The longer the better.  Combine stretch, strength, and aerobic activities.  If you were told in the past that you have high risk for cardiovascular diseases, you may seek evaluation by your heart doctor prior to initiating moderate to intense exercise programs.   Handouts Provided Include  Lifestyle Medicine   Learning Style & Readiness for Change Teaching method utilized: Visual & Auditory  Demonstrated degree of understanding via: Teach Back  Barriers to learning/adherence to lifestyle change: none  Goals Established by Pt  Goalsl  Contact BH therapist Try the cauliflower rice Walk 30 minutes with baby in stroller 4 times per week Test blood sugars a few times in am and pm weekly. Contact insurance company about a Dexcom and or ask why the copay to the [place] is changing. Lose 2 -3 lbs per month Get A1C to 5.7%.   MONITORING & EVALUATION Dietary intake, weekly physical activity, and weight  in 3 month.  Next Steps  Patient is to work on meal planning and eating more whole plant based foods.Marland Kitchen

## 2022-09-30 ENCOUNTER — Encounter: Payer: Self-pay | Admitting: Nutrition

## 2022-10-10 ENCOUNTER — Encounter: Payer: Self-pay | Admitting: Family Medicine

## 2022-10-10 ENCOUNTER — Ambulatory Visit: Payer: BC Managed Care – PPO | Admitting: Family Medicine

## 2022-10-10 VITALS — BP 104/68 | HR 87 | Temp 98.1°F | Ht 64.0 in | Wt 273.5 lb

## 2022-10-10 DIAGNOSIS — R0789 Other chest pain: Secondary | ICD-10-CM | POA: Diagnosis not present

## 2022-10-10 DIAGNOSIS — K219 Gastro-esophageal reflux disease without esophagitis: Secondary | ICD-10-CM | POA: Diagnosis not present

## 2022-10-10 MED ORDER — PANTOPRAZOLE SODIUM 20 MG PO TBEC: 20.0000 mg | DELAYED_RELEASE_TABLET | Freq: Two times a day (BID) | ORAL | 3 refills | Status: DC

## 2022-10-10 NOTE — Progress Notes (Signed)
Established Patient Office Visit  Subjective   Patient ID: Allison Friedman, female    DOB: 12-31-1995  Age: 27 y.o. MRN: [id number]  Chief Complaint  Patient presents with   Chest Pain    HPI  Allison Friedman is here for pain down the center of her chest for the last month. The pain is intermittent. Difficulty describing pain. Occurs with both activity and rest. She also has been coughing some. Pain is also more frequently when lying flat or sitting down. Can last up to an hour. Sometimes she feels like she can't get a good breath. She also felt nausea a lot lately. 1 episode of vomiting with a migraine. She is stopped taking protonix a while back. Also previously on carafate. Also feels some palpitations with this that feel like her heart is pumping hard. She does feel anxious when this happens. She is worried about her heart as her father had to have a valve replacement and had multiple heart attacks. She has had a Zio and EKG in the past without significant abnormalities.      ROS As per HPI.    Objective:     BP 104/68   Pulse 87   Temp 98.1 F (36.7 C) (Temporal)   Ht 5\' 4"  (1.626 m)   Wt 273 lb 8 oz (124.1 kg)   SpO2 98%   BMI 46.95 kg/m    Physical Exam Vitals and nursing note reviewed.  Constitutional:      General: She is not in acute distress.    Appearance: She is obese. She is not ill-appearing, toxic-appearing or diaphoretic.  HENT:     Head: Normocephalic and atraumatic.  Eyes:     Extraocular Movements: Extraocular movements intact.     Pupils: Pupils are equal, round, and reactive to light.  Cardiovascular:     Rate and Rhythm: Normal rate and regular rhythm.     Heart sounds: No murmur heard. Pulmonary:     Effort: Pulmonary effort is normal. No respiratory distress.     Breath sounds: Normal breath sounds.  Abdominal:     General: Bowel sounds are normal. There is no abdominal bruit.     Palpations: Abdomen is soft.     Tenderness: There is no guarding  or rebound.  Musculoskeletal:     Cervical back: Neck supple. No rigidity.     Right lower leg: No edema.     Left lower leg: No edema.  Skin:    General: Skin is warm and dry.  Neurological:     General: No focal deficit present.     Mental Status: She is alert and oriented to person, place, and time.  Psychiatric:        Mood and Affect: Mood normal.        Behavior: Behavior normal.      No results found for any visits on 10/10/22.    The ASCVD Risk score (Arnett DK, et al., 2019) failed to calculate for the following reasons:   The 2019 ASCVD risk score is only valid for ages 54 to 6    Assessment & Plan:   Allison Friedman was seen today for chest pain.  Diagnoses and all orders for this visit:  Atypical chest pain EKG today with NSR, no changes from previous EKG on file. Pain worsens with lying, sitting, and improves with standing. Not aggravated by activity. Known GERD and has stopping taking medication. Discussed likely etiology is GERD. Discussed referral to cardiology to reassuring patient  given her chronic illnesses and family hx. She declines today. Strict return precautions given.  -     EKG 12-Lead  Gastroesophageal reflux disease, unspecified whether esophagitis present Uncontrolled. Restart protonix.  -     pantoprazole (PROTONIX) 20 MG tablet; Take 1 tablet (20 mg total) by mouth 2 (two) times daily before a meal.   Keep schedule follow up appt. Sooner for new or worsening symptoms.   The patient indicates understanding of these issues and agrees with the plan.  Gabriel Earing, FNP

## 2022-11-24 ENCOUNTER — Telehealth: Payer: Self-pay | Admitting: Family Medicine

## 2022-11-24 ENCOUNTER — Ambulatory Visit: Payer: BC Managed Care – PPO

## 2022-11-24 NOTE — Telephone Encounter (Signed)
Needs to discontinue lipitor, ozempic, imitrex, and topamax

## 2022-11-24 NOTE — Telephone Encounter (Signed)
FYI

## 2022-11-25 NOTE — Telephone Encounter (Signed)
Patient aware and verbalizes understanding. 

## 2022-11-25 NOTE — Telephone Encounter (Signed)
Pt r/c.

## 2022-11-26 ENCOUNTER — Ambulatory Visit: Payer: BC Managed Care – PPO | Admitting: Family Medicine

## 2022-12-03 ENCOUNTER — Encounter: Payer: Self-pay | Admitting: Family Medicine

## 2022-12-03 ENCOUNTER — Ambulatory Visit: Payer: BC Managed Care – PPO | Admitting: Family Medicine

## 2022-12-03 VITALS — BP 109/63 | HR 72 | Temp 98.3°F | Ht 64.0 in | Wt 277.0 lb

## 2022-12-03 DIAGNOSIS — E1165 Type 2 diabetes mellitus with hyperglycemia: Secondary | ICD-10-CM

## 2022-12-03 DIAGNOSIS — E785 Hyperlipidemia, unspecified: Secondary | ICD-10-CM | POA: Diagnosis not present

## 2022-12-03 DIAGNOSIS — E1169 Type 2 diabetes mellitus with other specified complication: Secondary | ICD-10-CM

## 2022-12-03 DIAGNOSIS — N912 Amenorrhea, unspecified: Secondary | ICD-10-CM | POA: Diagnosis not present

## 2022-12-03 DIAGNOSIS — Z3490 Encounter for supervision of normal pregnancy, unspecified, unspecified trimester: Secondary | ICD-10-CM

## 2022-12-03 LAB — PREGNANCY, URINE: Preg Test, Ur: POSITIVE — AB

## 2022-12-03 NOTE — Progress Notes (Signed)
   Acute Office Visit  Subjective:     Patient ID: Allison Friedman, female    DOB: 1995-09-06, 27 y.o.   MRN: [id number]  Chief Complaint  Patient presents with   Amenorrhea    HPI Patient is in today for amenorrhea. LMP was 10/09/22. She also reports breast tenderness and enlargement. She also has fatigue and food cravings. She needs a confirmation to schedule an appt with OB. She denies vaginal bleeding. She has had positive home pregnancy tests.  ROS As per HPI.      Objective:    BP 109/63   Pulse 72   Temp 98.3 F (36.8 C) (Temporal)   Ht 5\' 4"  (1.626 m)   Wt 277 lb (125.6 kg)   LMP 10/09/2022 (Exact Date)   SpO2 99%   BMI 47.55 kg/m    Physical Exam Vitals and nursing note reviewed.  Constitutional:      General: She is not in acute distress.    Appearance: She is obese. She is not ill-appearing, toxic-appearing or diaphoretic.  Pulmonary:     Effort: Pulmonary effort is normal. No respiratory distress.  Musculoskeletal:     Right lower leg: No edema.     Left lower leg: No edema.  Skin:    General: Skin is warm and dry.  Neurological:     General: No focal deficit present.     Mental Status: She is alert and oriented to person, place, and time.  Psychiatric:        Mood and Affect: Mood normal.        Behavior: Behavior normal.     No results found for any visits on 12/03/22.      Assessment & Plan:   Allison Friedman was seen today for amenorrhea.  Diagnoses and all orders for this visit:  Amenorrhea -     Pregnancy, urine -     Ambulatory referral to Obstetrics / Gynecology  Early stage of pregnancy -     Ambulatory referral to Obstetrics / Gynecology  Type 2 diabetes mellitus with hyperglycemia, without long-term current use of insulin (HCC)  Hyperlipidemia associated with type 2 diabetes mellitus (HCC)   Positive urine pregnancy test today with LMP 10/09/22. She has stopped ozempic and statin. She is aware that Ob will manage these conditions  once established. Recent A1c is well controlled at 6.4. Referral to OB placed. Discussed return precautions.   The patient indicates understanding of these issues and agrees with the plan.  Gabriel Earing, FNP

## 2022-12-08 ENCOUNTER — Other Ambulatory Visit: Payer: Self-pay | Admitting: Obstetrics & Gynecology

## 2022-12-08 DIAGNOSIS — O3680X0 Pregnancy with inconclusive fetal viability, not applicable or unspecified: Secondary | ICD-10-CM

## 2022-12-08 DIAGNOSIS — Z6841 Body Mass Index (BMI) 40.0 and over, adult: Secondary | ICD-10-CM

## 2022-12-08 DIAGNOSIS — O24119 Pre-existing diabetes mellitus, type 2, in pregnancy, unspecified trimester: Secondary | ICD-10-CM

## 2022-12-09 ENCOUNTER — Other Ambulatory Visit: Payer: BC Managed Care – PPO | Admitting: Radiology

## 2022-12-09 ENCOUNTER — Encounter: Payer: Self-pay | Admitting: Radiology

## 2022-12-09 ENCOUNTER — Other Ambulatory Visit: Payer: Self-pay | Admitting: Obstetrics & Gynecology

## 2022-12-09 DIAGNOSIS — Z6841 Body Mass Index (BMI) 40.0 and over, adult: Secondary | ICD-10-CM

## 2022-12-09 DIAGNOSIS — O24111 Pre-existing diabetes mellitus, type 2, in pregnancy, first trimester: Secondary | ICD-10-CM | POA: Diagnosis not present

## 2022-12-09 DIAGNOSIS — O24119 Pre-existing diabetes mellitus, type 2, in pregnancy, unspecified trimester: Secondary | ICD-10-CM

## 2022-12-09 DIAGNOSIS — Z3A08 8 weeks gestation of pregnancy: Secondary | ICD-10-CM

## 2022-12-09 DIAGNOSIS — O3680X0 Pregnancy with inconclusive fetal viability, not applicable or unspecified: Secondary | ICD-10-CM

## 2022-12-09 NOTE — Progress Notes (Cosign Needed)
GA by LMP of 10-09-22 = 8+5 weeks  Ultrasound today CRL = 2.9 mm   20 days < dates   FHR = 111 bpm Single normal YS = 2.5 mm   gest sac in normal location mid fundal uterus Normal ovaries - corpus luteal Rt ov = 32 x 12 mm  neg adnexal regions - neg CDS,  no free fluid present EDD by early [place] = 08-05-2023

## 2022-12-18 ENCOUNTER — Ambulatory Visit: Payer: BC Managed Care – PPO | Admitting: Obstetrics & Gynecology

## 2022-12-18 ENCOUNTER — Encounter: Payer: Self-pay | Admitting: Obstetrics & Gynecology

## 2022-12-18 VITALS — BP 130/84 | HR 71 | Ht 64.0 in | Wt 277.2 lb

## 2022-12-18 DIAGNOSIS — E119 Type 2 diabetes mellitus without complications: Secondary | ICD-10-CM | POA: Diagnosis not present

## 2022-12-18 DIAGNOSIS — Z3490 Encounter for supervision of normal pregnancy, unspecified, unspecified trimester: Secondary | ICD-10-CM

## 2022-12-18 DIAGNOSIS — O24111 Pre-existing diabetes mellitus, type 2, in pregnancy, first trimester: Secondary | ICD-10-CM

## 2022-12-18 MED ORDER — ACCU-CHEK GUIDE VI STRP
ORAL_STRIP | 12 refills | Status: DC
Start: 2022-12-18 — End: 2023-01-02

## 2022-12-18 MED ORDER — INSULIN NPH (HUMAN) (ISOPHANE) 100 UNIT/ML ~~LOC~~ SUSP
5.0000 [IU] | Freq: Two times a day (BID) | SUBCUTANEOUS | 3 refills | Status: DC
Start: 2022-12-18 — End: 2023-02-04

## 2022-12-18 MED ORDER — INSULIN SYRINGE 29G X 1/2" 0.3 ML MISC
1.0000 [IU] | Freq: Two times a day (BID) | 6 refills | Status: DC
Start: 2022-12-18 — End: 2023-11-13

## 2022-12-18 MED ORDER — ACCU-CHEK SOFTCLIX LANCETS MISC
12 refills | Status: DC
Start: 2022-12-18 — End: 2023-07-17

## 2022-12-18 NOTE — Progress Notes (Signed)
GYN VISIT Patient name: Allison Friedman MRN [id number]  Date of birth: 21-Sep-1995 Chief Complaint:   Needs Medication (Pregnant and type two diabetic. Needs medication. Can not take metformin)  History of Present Illness:   Allison Friedman is a 27 y.o. G3P1011 about [redacted]wks pregnant-  female being seen today for the following concerns:  -T2DM- previously on ozempic, but stopped once she was pregnant.  Per pt, her PCP did not feel comfortable prescribing medication since she was pregnant.  Her initial OB visit is not until end of November.  She notes that her sugars are ranging anywhere from 200-250  Of note patient reports that she cannot tolerate metformin.  She had to do insulin in her prior pregnancy and states she has a fear of needles and her husband needs to do her injections  -Anxiety-patient in the process of seeing psychologist and therapist.  She is not currently on any medicine  Patient's last menstrual period was 10/09/2022 (exact date).    Review of Systems:   Pertinent items are noted in HPI Denies fever/chills, dizziness, headaches, visual disturbances, fatigue, shortness of breath, chest pain, abdominal pain, vomiting. Pertinent History Reviewed:   Past Surgical History:  Procedure Laterality Date   CESAREAN SECTION N/A 05/08/2021   Procedure: CESAREAN SECTION;  Surgeon: Toy Baker, DO;  Location: [facility]S;  Service: Obstetrics;  Laterality: N/A;   CYST REMOVAL NECK     infected sweat gland - cyst removal on the chest   NECK SURGERY      Past Medical History:  Diagnosis Date   Anemia    Anxiety    Childhood asthma    Depression    Diabetes mellitus without complication (HCC)    Palpitations    Reviewed problem list, medications and allergies. Physical Assessment:   Vitals:   12/18/22 1030  BP: 130/84  Pulse: 71  Weight: 277 lb 3.2 oz (125.7 kg)  Height: 5\' 4"  (1.626 m)  Body mass index is 47.58 kg/m.       Physical Examination:   General  appearance: alert, well appearing, and in no distress  Psych: anxious, pressured speech  Skin: warm & dry   Cardiovascular: normal heart rate noted  Respiratory: normal respiratory effort, no distress  Abdomen: soft, non-tender   Pelvic: examination not indicated  Extremities: no edema   Chaperone: N/A    Assessment & Plan:  1) T2DM -Plan to start patient on NPH 5 units twice daily -Referral created for endocrinology to help coordinate transition to OmniPod/Dexcom -Will plan to have follow-up in 2 weeks and discussed that she will likely also need regular insulin to cover meals  2) Early pregnancy -Initial OB appointment scheduled   Orders Placed This Encounter  Procedures   Ambulatory referral to Endocrinology   Meds ordered this encounter  Medications   insulin NPH Human (HUMULIN N) 100 UNIT/ML injection    Sig: Inject 0.05 mLs (5 Units total) into the skin in the morning and at bedtime.    Dispense:  10 mL    Refill:  3   Insulin Syringe-Needle U-100 (INSULIN SYRINGE .3CC/29GX1/2") 29G X 1/2" 0.3 ML MISC    Sig: 1 Units by Does not apply route 2 (two) times daily.    Dispense:  200 each    Refill:  6   glucose blood (ACCU-CHEK GUIDE) test strip    Sig: Use as instructed to check blood sugar 4 times daily    Dispense:  50 each  Refill:  12   Accu-Chek Softclix Lancets lancets    Sig: Use as instructed to check blood sugar 4 times daily    Dispense:  100 each    Refill:  12    Return in about 2 weeks (around 01/01/2023).   Myna Hidalgo, DO Attending Obstetrician & Gynecologist, [facility] for [employer], [facility]

## 2022-12-31 NOTE — Patient Instructions (Signed)

## 2023-01-01 ENCOUNTER — Encounter: Payer: Self-pay | Admitting: Obstetrics & Gynecology

## 2023-01-01 ENCOUNTER — Ambulatory Visit: Payer: BC Managed Care – PPO | Admitting: Obstetrics & Gynecology

## 2023-01-01 VITALS — BP 104/68 | HR 75 | Ht 64.0 in | Wt 279.8 lb

## 2023-01-01 DIAGNOSIS — E119 Type 2 diabetes mellitus without complications: Secondary | ICD-10-CM | POA: Diagnosis not present

## 2023-01-01 DIAGNOSIS — Z3490 Encounter for supervision of normal pregnancy, unspecified, unspecified trimester: Secondary | ICD-10-CM | POA: Diagnosis not present

## 2023-01-01 NOTE — Progress Notes (Signed)
   GYN VISIT Patient name: Allison Friedman MRN [id number]  Date of birth: Feb 09, 1996 Chief Complaint:   Follow-up  History of Present Illness:   Allison Friedman is a 27 y.o. G72P1011 female being seen today for DM management in early pregnancy  1) Class C DM -Currently on 5 units NPH twice daily -Sugars reviewed all elevated mostly 140s to 200 -Unable to take metformin  She still notes episodes of feeling lightheaded after she eats.  She notes that she is drinking more water and trying to work on diet changes.  Patient's last menstrual period was 10/09/2022 (exact date).    Review of Systems:   Pertinent items are noted in HPI  Pertinent History Reviewed:   Past Surgical History:  Procedure Laterality Date   CESAREAN SECTION N/A 05/08/2021   Procedure: CESAREAN SECTION;  Surgeon: Toy Baker, DO;  Location: [facility];  Service: Obstetrics;  Laterality: N/A;   CYST REMOVAL NECK     infected sweat gland - cyst removal on the chest   NECK SURGERY      Past Medical History:  Diagnosis Date   Anemia    Anxiety    Childhood asthma    Depression    Diabetes mellitus without complication (HCC)    Palpitations    Reviewed problem list, medications and allergies. Physical Assessment:   Vitals:   01/01/23 1028  BP: 104/68  Pulse: 75  Weight: 279 lb 12.8 oz (126.9 kg)  Height: 5\' 4"  (1.626 m)  Body mass index is 48.03 kg/m.       Physical Examination:   General appearance: alert, well appearing, and in no distress  Psych: mood appropriate, normal affect  Skin: warm & dry   Cardiovascular: normal heart rate noted  Respiratory: normal respiratory effort, no distress  Ext: no edema  Chaperone: N/A    Assessment & Plan:  1) Class C DM -Will increase to 10 units NPH twice daily -Patient has appoint with endocrinology tomorrow and is hopeful to have an OmniPod and Dexcom  2) early pregnancy -scheduled for [place] and initial OB visit 11/27   Return for as  scheduled.   Myna Hidalgo, DO Attending Obstetrician & Gynecologist, [facility] for [employer], [facility]

## 2023-01-02 ENCOUNTER — Telehealth: Payer: Self-pay

## 2023-01-02 ENCOUNTER — Ambulatory Visit: Payer: BC Managed Care – PPO | Admitting: Nurse Practitioner

## 2023-01-02 ENCOUNTER — Encounter: Payer: Self-pay | Admitting: Nurse Practitioner

## 2023-01-02 VITALS — BP 115/78 | HR 79 | Ht 64.0 in | Wt 280.4 lb

## 2023-01-02 DIAGNOSIS — E119 Type 2 diabetes mellitus without complications: Secondary | ICD-10-CM | POA: Diagnosis not present

## 2023-01-02 DIAGNOSIS — O24119 Pre-existing diabetes mellitus, type 2, in pregnancy, unspecified trimester: Secondary | ICD-10-CM

## 2023-01-02 DIAGNOSIS — Z3A00 Weeks of gestation of pregnancy not specified: Secondary | ICD-10-CM

## 2023-01-02 DIAGNOSIS — Z794 Long term (current) use of insulin: Secondary | ICD-10-CM

## 2023-01-02 LAB — POCT GLYCOSYLATED HEMOGLOBIN (HGB A1C): Hemoglobin A1C: 6.5 % — AB (ref 4.0–5.6)

## 2023-01-02 MED ORDER — DEXCOM G7 RECEIVER DEVI: 1.0000 | Freq: Once | 0 refills | Status: AC

## 2023-01-02 MED ORDER — DEXCOM G7 SENSOR MISC: 1.0000 | 3 refills | Status: DC

## 2023-01-02 NOTE — Telephone Encounter (Signed)
Pt said that her dexcom you gave her is not compatible with her phone. She has no way to check her sugar. Please Advise.

## 2023-01-02 NOTE — Progress Notes (Signed)
Endocrinology Consult Note       01/02/2023, 9:24 AM   Subjective:    Patient ID: Allison Friedman, female    DOB: 1995-06-20.  Allison Friedman is being seen in consultation for management of currently uncontrolled symptomatic diabetes requested by  Gabriel Earing, FNP.   Past Medical History:  Diagnosis Date   Anemia    Anxiety    Childhood asthma    Depression    Diabetes mellitus without complication (HCC)    Palpitations     Past Surgical History:  Procedure Laterality Date   CESAREAN SECTION N/A 05/08/2021   Procedure: CESAREAN SECTION;  Surgeon: Toy Baker, DO;  Location: [facility] LD [facility];  Service: Obstetrics;  Laterality: N/A;   CYST REMOVAL NECK     infected sweat gland - cyst removal on the chest   NECK SURGERY      Social History   Socioeconomic History   Marital status: Married    Spouse name: Reuel Boom   Number of children: 1   Years of education: 12   Highest education level: GED or equivalent  Occupational History   Not on file  Tobacco Use   Smoking status: Never   Smokeless tobacco: Never  Vaping Use   Vaping status: Never Used  Substance and Sexual Activity   Alcohol use: Never   Drug use: Never   Sexual activity: Yes    Comment: Pregnant  Other Topics Concern   Not on file  Social History Narrative   Not on file   Social Determinants of Health   Financial Resource Strain: Low Risk  (07/28/2022)   Overall Financial Resource Strain (CARDIA)    Difficulty of Paying Living Expenses: Not hard at all  Food Insecurity: No Food Insecurity (07/28/2022)   Hunger Vital Sign    Worried About Running Out of Food in the Last Year: Never true    Ran Out of Food in the Last Year: Never true  Transportation Needs: No Transportation Needs (07/28/2022)   PRAPARE - Administrator, Civil Service (Medical): No    Lack of Transportation (Non-Medical): No  Physical Activity: Unknown  (07/28/2022)   Exercise Vital Sign    Days of Exercise per Week: Patient declined    Minutes of Exercise per Session: Not on file  Stress: No Stress Concern Present (07/28/2022)   [employer] of Occupational Health - Occupational Stress Questionnaire    Feeling of Stress : Not at all  Social Connections: Moderately Integrated (07/28/2022)   Social Connection and Isolation Panel [NHANES]    Frequency of Communication with Friends and Family: More than three times a week    Frequency of Social Gatherings with Friends and Family: Once a week    Attends Religious Services: 1 to 4 times per year    Active Member of [employer] or Organizations: No    Attends Engineer, structural: Not on file    Marital Status: Married    Family History  Problem Relation Age of Onset   Depression Mother    Arthritis Mother    Anxiety disorder Mother    Diabetes Mother    Hypertension Mother    Diabetes  Father    Alcohol abuse Father    Hypertension Father    Congenital Murmur Father    Heart attack Father        late 22's or early 93's   Hearing loss Brother        born deaf   Asthma Brother    Colon cancer Maternal Grandfather     Outpatient Encounter Medications as of 01/02/2023  Medication Sig   Accu-Chek Softclix Lancets lancets Use as instructed to check blood sugar 4 times daily   Continuous Glucose Sensor (DEXCOM G7 SENSOR) MISC Inject 1 Application into the skin as directed. Change sensor every 10 days as directed.   insulin NPH Human (HUMULIN N) 100 UNIT/ML injection Inject 0.05 mLs (5 Units total) into the skin in the morning and at bedtime.   Insulin Syringe-Needle U-100 (INSULIN SYRINGE .3CC/29GX1/2") 29G X 1/2" 0.3 ML MISC 1 Units by Does not apply route 2 (two) times daily.   linaclotide (LINZESS) 145 MCG CAPS capsule Take 1 capsule (145 mcg total) by mouth daily before breakfast.   Prenatal Vit-Fe Fumarate-FA (PRENATAL MULTIVITAMIN) TABS tablet Take 1 tablet by mouth daily at 12  noon.   Continuous Blood Gluc Sensor (FREESTYLE LIBRE 2 SENSOR) MISC Use to test blood sugar continuously. Apply sensor to arm every 14 days.  DX: E11.65 (Patient not taking: Reported on 01/02/2023)   glucose blood (ACCU-CHEK GUIDE) test strip Use as instructed to check blood sugar 4 times daily (Patient not taking: Reported on 01/02/2023)   pantoprazole (PROTONIX) 20 MG tablet Take 1 tablet (20 mg total) by mouth 2 (two) times daily before a meal. (Patient not taking: Reported on 12/03/2022)   No facility-administered encounter medications on file as of 01/02/2023.    ALLERGIES: Allergies  Allergen Reactions   Metformin And Related Diarrhea    Intestinal cramps   Prozac [Fluoxetine] Other (See Comments)    Reports suicidal thoughts with prozac or xanax--not willing to risk being on either medication   Xanax [Alprazolam] Other (See Comments)    Reports suicidal thoughts with prozac or xanax--not willing to risk being on either medication     VACCINATION STATUS: Immunization History  Administered Date(s) Administered   Influenza,inj,Quad PF,6+ Mos 11/13/2021   Influenza-Unspecified 12/05/2020   Moderna Sars-Covid-2 Vaccination 06/07/2019, 06/28/2019   PFIZER Comirnaty(Gray Top)Covid-19 Tri-Sucrose Vaccine 06/02/2019, 06/27/2019    Diabetes She presents for her initial diabetic visit. She has type 2 diabetes mellitus. Onset time: diagnosed at a approx age of 27. Her disease course has been improving. There are no hypoglycemic associated symptoms. There are no diabetic associated symptoms. There are no hypoglycemic complications. There are no diabetic complications. Risk factors for coronary artery disease include diabetes mellitus, family history, obesity and dyslipidemia. Current diabetic treatment includes insulin injections (was previously on Ozempic but stopped after positive pregnancy test, currently on NPH (recently increased to 10 units BID)). She is compliant with treatment most of  the time. Her weight is fluctuating minimally. She is following a generally healthy diet. When asked about meal planning, she reported none. She has not had a previous visit with a dietitian. She participates in exercise intermittently. (She presents today for her consultation with her meter and logs showing slightly above target glycemic profile.  Her POCT A1c today is 6.5%.  She monitors glucose 3-4 times daily.  She drinks water, eats 2 meals per day and snacks between.  She does not engage in routine physical activity outside of caring for her toddler.  She is due for eye exam now, has never seen podiatry in the past.) An ACE inhibitor/angiotensin II receptor blocker is not being taken. She does not see a podiatrist.Eye exam is current.     Review of systems  Constitutional: + Minimally fluctuating body weight, current Body mass index is 48.13 kg/m., no fatigue, no subjective hyperthermia, no subjective hypothermia Eyes: no blurry vision, no xerophthalmia ENT: no sore throat, no nodules palpated in throat, no dysphagia/odynophagia, no hoarseness Cardiovascular: no chest pain, no shortness of breath, no palpitations, no leg swelling Respiratory: no cough, no shortness of breath Gastrointestinal: no nausea/vomiting/diarrhea Musculoskeletal: no muscle/joint aches Skin: no rashes, no hyperemia Neurological: no tremors, no numbness, no tingling, no dizziness Psychiatric: no depression, no anxiety  Objective:     BP 115/78 (BP Location: Left Arm, Patient Position: Sitting, Cuff Size: Large)   Pulse 79   Ht 5\' 4"  (1.626 m)   Wt 280 lb 6.4 oz (127.2 kg)   LMP 10/09/2022 (Exact Date) Comment: early ultrasound on 12/09/22    size < dates  EDD by early [place] = 6-11---25  BMI 48.13 kg/m   Wt Readings from Last 3 Encounters:  01/02/23 280 lb 6.4 oz (127.2 kg)  01/01/23 279 lb 12.8 oz (126.9 kg)  12/18/22 277 lb 3.2 oz (125.7 kg)     BP Readings from Last 3 Encounters:  01/02/23 115/78   01/01/23 104/68  12/18/22 130/84     Physical Exam- Limited  Constitutional:  Body mass index is 48.13 kg/m. , not in acute distress, normal state of mind Eyes:  EOMI, no exophthalmos Neck: Supple Musculoskeletal: no gross deformities, strength intact in all four extremities, no gross restriction of joint movements Skin:  no rashes, no hyperemia Neurological: no tremor with outstretched hands   Diabetic Foot Exam - Simple   No data filed      CMP ( most recent) CMP     Component Value Date/Time   NA 140 08/25/2022 1505   K 4.5 08/25/2022 1505   CL 105 08/25/2022 1505   CO2 23 08/25/2022 1505   GLUCOSE 92 08/25/2022 1505   GLUCOSE 219 (H) 05/07/2021 0035   BUN 13 08/25/2022 1505   CREATININE 0.78 08/25/2022 1505   CALCIUM 8.9 08/25/2022 1505   PROT 7.2 08/25/2022 1505   ALBUMIN 4.2 08/25/2022 1505   AST 18 08/25/2022 1505   ALT 27 08/25/2022 1505   ALKPHOS 82 08/25/2022 1505   BILITOT <0.2 08/25/2022 1505   EGFR 107 08/25/2022 1505   GFRNONAA >60 12/22/2020 0951     Diabetic Labs (most recent): Lab Results  Component Value Date   HGBA1C 6.5 (A) 01/02/2023   HGBA1C 6.4 (H) 08/25/2022   HGBA1C 6.0 (H) 05/22/2022     Lipid Panel ( most recent) Lipid Panel     Component Value Date/Time   CHOL 158 02/20/2022 1050   TRIG 188 (H) 02/20/2022 1050   HDL 34 (L) 02/20/2022 1050   CHOLHDL 4.6 (H) 02/20/2022 1050   LDLCALC 92 02/20/2022 1050   LABVLDL 32 02/20/2022 1050      No results found for: "TSH", "FREET4"         Assessment & Plan:   1) Type 2 diabetes mellitus without complication, without long-term current use of insulin (HCC) 2) Pre-existing type 2 diabetes mellitus during pregnancy, antepartum  She presents today for her consultation with her meter and logs showing slightly above target glycemic profile.  Her POCT A1c today is 6.5%.  She  monitors glucose 3-4 times daily.  She drinks water, eats 2 meals per day and snacks between.  She does  not engage in routine physical activity outside of caring for her toddler.  She is due for eye exam now, has never seen podiatry in the past.  - Allison Friedman has currently uncontrolled symptomatic type 2 DM since 27 years of age, with most recent A1c of 6.5 %.   -Recent labs reviewed.  - I had a long discussion with her about the progressive nature of diabetes and the pathology behind its complications. -her diabetes is not currently complicated but she remains at a high risk for more acute and chronic complications which include CAD, CVA, CKD, retinopathy, and neuropathy. These are all discussed in detail with her.  The following Lifestyle Medicine recommendations according to American College of Lifestyle Medicine [facility]) were discussed and offered to patient and she agrees to start the journey:  A. Whole Foods, Plant-based plate comprising of fruits and vegetables, plant-based proteins, whole-grain carbohydrates was discussed in detail with the patient.   A list for source of those nutrients were also provided to the patient.  Patient will use only water or unsweetened tea for hydration. B.  The need to stay away from risky substances including alcohol, smoking; obtaining 7 to 9 hours of restorative sleep, at least 150 minutes of moderate intensity exercise weekly, the importance of healthy social connections,  and stress reduction techniques were discussed. C.  A full color page of  Calorie density of various food groups per pound showing examples of each food groups was provided to the patient.  - I have counseled her on diet and weight management by adopting a carbohydrate restricted/protein rich diet. Patient is encouraged to switch to unprocessed or minimally processed complex starch and increased protein intake (animal or plant source), fruits, and vegetables. -  she is advised to stick to a routine mealtimes to eat 3 meals a day and avoid unnecessary snacks (to snack only to correct  hypoglycemia).   - she acknowledges that there is a room for improvement in her food and drink choices. - Suggestion is made for her to avoid simple carbohydrates from her diet including Cakes, Sweet Desserts, Ice Cream, Soda (diet and regular), Sweet Tea, Candies, Chips, Cookies, Store Bought Juices, Alcohol in Excess of 1-2 drinks a day, Artificial Sweeteners, Coffee Creamer, and "Sugar-free" Products. This will help patient to have more stable blood glucose profile and potentially avoid unintended weight gain.  - I have approached her with the following individualized plan to manage her diabetes and patient agrees:   -She is currently using NPH 10 units twice daily.  She can continue this for now but will order Omnipod 5 to be able to manage her glucose tighter.  I have sample Omnipod 5 in office, will send notification to my Omnipod rep to see if she is a good candidate to use this one and to help set her up for training as well.  Tighter control of diabetes during pregnancy is necessary to help ensure healthy development of fetus.  -she is encouraged to start monitoring glucose 4 times daily, before meals and before bed, to log their readings on the clinic sheets provided, and bring them to review at follow up appointment in 1 month.  I did give her sample Dexcom G7 to help her carefully watch her glucose.  - she is warned not to take insulin without proper monitoring per orders. - Adjustment parameters are given  to her for hypo and hyperglycemia in writing. - she is encouraged to call clinic for blood glucose levels less than 60 or above 200 mg /dl.  -She is not a candidate for incretin therapy at this time due to pregnancy.  Insulin is the safest treatment for her and the developing baby.  Once she has delivered the baby, she will be able to transition back to Ozempic as she was on previously.  - Specific targets for  A1c; LDL, HDL, and Triglycerides were discussed with the patient.  3)  Blood Pressure /Hypertension:  her blood pressure is controlled to target without the use of antihypertensive medications.    4) Lipids/Hyperlipidemia:    Review of her recent lipid panel from 02/20/22 showed controlled LDL at 92 and elevated triglycerides of 188.  She is not currently on any lipid lowering medications.   5)  Weight/Diet:  her Body mass index is 48.13 kg/m.  -  clearly complicating her diabetes care.   she is a candidate for weight loss. I discussed with her the fact that loss of 5 - 10% of her  current body weight will have the most impact on her diabetes management.  Exercise, and detailed carbohydrates information provided  -  detailed on discharge instructions.  6) Chronic Care/Health Maintenance: -she is not on ACEI/ARB or Statin medications and is encouraged to initiate and continue to follow up with Ophthalmology, Dentist, Podiatrist at least yearly or according to recommendations, and advised to stay away from smoking. I have recommended yearly flu vaccine and pneumonia vaccine at least every 5 years; moderate intensity exercise for up to 150 minutes weekly; and sleep for at least 7 hours a day.  - she is advised to maintain close follow up with Gabriel Earing, FNP for primary care needs, as well as her other providers for optimal and coordinated care.   - Time spent in this patient care: 60 min, of which > 50% was spent in counseling her about her diabetes and the rest reviewing her blood glucose logs, discussing her hypoglycemia and hyperglycemia episodes, reviewing her current and previous labs/studies (including abstraction from other facilities) and medications doses and developing a long term treatment plan based on the latest standards of care/guidelines; and documenting her care.    Please refer to Patient Instructions for Blood Glucose Monitoring and Insulin/Medications Dosing Guide" in media tab for additional information. Please also refer to "Patient Self  Inventory" in the Media tab for reviewed elements of pertinent patient history.  Zack Seal participated in the discussions, expressed understanding, and voiced agreement with the above plans.  All questions were answered to her satisfaction. she is encouraged to contact clinic should she have any questions or concerns prior to her return visit.     Follow up plan: - Return in about 1 month (around 02/01/2023) for Diabetes F/U, Bring meter and logs.    Ronny Bacon, [facility] [address] [place], [place] [address] Phone: [phone] Fax: [phone]  01/02/2023, 9:24 AM

## 2023-01-02 NOTE — Telephone Encounter (Signed)
Oh no!  I can send in a receiver for her to be able to get her readings.  This will put a damper on the Omnipod and how it works though.  The pump will still work but not in the way it is designed to.  I will make sure Allison Friedman knows this so we can figure out the best path forward.  She lives in [place] so I know she wouldn't be interested in coming back to pick up a sample receiver.

## 2023-01-02 NOTE — Telephone Encounter (Signed)
Pt made awre

## 2023-01-07 ENCOUNTER — Telehealth: Payer: Self-pay | Admitting: *Deleted

## 2023-01-07 NOTE — Telephone Encounter (Signed)
Patient left a message stating that she had reached out to her insurance company, and was told that they were still waiting on a PA for her Dexcom Reader and Sensors.. Sending tot he RX PA team to see if they have seen this request, and if so to send [place] a update on it.

## 2023-01-08 ENCOUNTER — Other Ambulatory Visit (HOSPITAL_COMMUNITY): Payer: Self-pay

## 2023-01-08 ENCOUNTER — Ambulatory Visit: Payer: BC Managed Care – PPO | Admitting: Nutrition

## 2023-01-08 ENCOUNTER — Telehealth: Payer: Self-pay

## 2023-01-08 NOTE — Telephone Encounter (Signed)
Patient was called and a VM was left updating her .

## 2023-01-08 NOTE — Telephone Encounter (Signed)
Patient was called and made aware. 

## 2023-01-08 NOTE — Telephone Encounter (Signed)
Noted, I did send to the PA team yesterday.

## 2023-01-08 NOTE — Telephone Encounter (Signed)
I sent this pt a mychart message Tuesday to make her aware that we are still waiting on that PA and per Whitney she can finger stick in the mean time

## 2023-01-08 NOTE — Telephone Encounter (Signed)
Pharmacy Patient Advocate Encounter   Received notification from Pt Calls Messages that prior authorization for Dexcom G7 sensor is required/requested.   Insurance verification completed.   The patient is insured through [employer] .   Per test claim: PA required; PA submitted to above mentioned insurance via CoverMyMeds Key/confirmation #/EOC [id number] Status is pending   PA has been APPROVED through 01/07/2024 PA Case ID #: [id number]

## 2023-01-14 ENCOUNTER — Encounter: Payer: Self-pay | Admitting: Nurse Practitioner

## 2023-01-14 MED ORDER — INSULIN LISPRO 100 UNIT/ML IJ SOLN: INTRAMUSCULAR | 3 refills | Status: DC

## 2023-01-15 ENCOUNTER — Other Ambulatory Visit: Payer: Self-pay | Admitting: Nurse Practitioner

## 2023-01-15 MED ORDER — OMNIPOD 5 DEXG7G6 PODS GEN 5 MISC: 6 refills | Status: DC

## 2023-01-20 ENCOUNTER — Other Ambulatory Visit: Payer: Self-pay | Admitting: Obstetrics & Gynecology

## 2023-01-20 DIAGNOSIS — O3680X0 Pregnancy with inconclusive fetal viability, not applicable or unspecified: Secondary | ICD-10-CM

## 2023-01-21 ENCOUNTER — Encounter: Payer: Self-pay | Admitting: *Deleted

## 2023-01-21 ENCOUNTER — Other Ambulatory Visit: Payer: BC Managed Care – PPO

## 2023-01-21 ENCOUNTER — Ambulatory Visit: Payer: BC Managed Care – PPO | Admitting: *Deleted

## 2023-01-21 ENCOUNTER — Encounter: Payer: BC Managed Care – PPO | Admitting: Obstetrics and Gynecology

## 2023-01-21 VITALS — BP 105/61 | HR 59 | Wt 276.0 lb

## 2023-01-21 DIAGNOSIS — Z3A12 12 weeks gestation of pregnancy: Secondary | ICD-10-CM | POA: Diagnosis not present

## 2023-01-21 DIAGNOSIS — O0990 Supervision of high risk pregnancy, unspecified, unspecified trimester: Secondary | ICD-10-CM | POA: Insufficient documentation

## 2023-01-21 DIAGNOSIS — Z3491 Encounter for supervision of normal pregnancy, unspecified, first trimester: Secondary | ICD-10-CM

## 2023-01-21 DIAGNOSIS — O3680X0 Pregnancy with inconclusive fetal viability, not applicable or unspecified: Secondary | ICD-10-CM

## 2023-01-21 DIAGNOSIS — O0991 Supervision of high risk pregnancy, unspecified, first trimester: Secondary | ICD-10-CM

## 2023-01-21 DIAGNOSIS — E1165 Type 2 diabetes mellitus with hyperglycemia: Secondary | ICD-10-CM

## 2023-01-21 DIAGNOSIS — Z31430 Encounter of female for testing for genetic disease carrier status for procreative management: Secondary | ICD-10-CM | POA: Diagnosis not present

## 2023-01-21 NOTE — Progress Notes (Signed)
[place] 12 wks,measurements c/w dates,posterior placenta gr 0,normal ovaries,FHR 150 bpm,NT 1.4 mm,NB present,limited view because of pt body habitus

## 2023-01-21 NOTE — Progress Notes (Signed)
New OB Intake  I explained I am completing New OB Intake today. We discussed EDD of 08/05/2023, Alternate EDD Entry. Pt is [id number]. I reviewed her allergies, medications and Medical/Surgical/OB history.    Patient Active Problem List   Diagnosis Date Noted   Supervision of high-risk pregnancy 01/21/2023   Chronic migraine w/o aura w/o status migrainosus, not intractable 06/13/2022   Esophageal dysphagia 06/11/2022   Hyperlipidemia associated with type 2 diabetes mellitus (HCC) 05/22/2022   Fatty liver disease, nonalcoholic 10/25/2021   Constipation 10/25/2021   Dysphagia 10/25/2021   GERD (gastroesophageal reflux disease) 10/25/2021   Abdominal pain, epigastric 10/25/2021   Elevated lipase 10/25/2021   Generalized anxiety disorder 08/08/2021   Depression, recurrent (HCC) 08/08/2021   Morbid obesity (HCC) 08/08/2021   Type 2 diabetes mellitus 05/09/2021   Anxiety and depression 05/09/2021    Concerns addressed today  Patient informed that the ultrasound is considered a limited obstetric ultrasound and is not intended to be a complete ultrasound exam.  Patient also informed that the ultrasound is not being completed with the intent of assessing for fetal or placental anomalies or any pelvic abnormalities. Explained that the purpose of today's ultrasound is to assess for viability.  Patient acknowledges the purpose of the exam and the limitations of the study.     Delivery Plans Plans to deliver at [facility]. Discussed the nature of our practice with multiple providers including residents and students. Due to the size of the practice, the delivering provider may not be the same as those providing prenatal care.   MyChart/Babyscripts MyChart access verified. I explained pt will have some visits in office and some virtually. Babyscripts app discussed and ordered.   Blood Pressure Cuff Blood pressure cuff  given from office Discussed to be used for virtual visits and or if needed BP checks  weekly.  Anatomy US Explained first scheduled [place] will be around 19 weeks.   Last Pap Patient thinks 2 years ago and was abnormal.  Will repeat at next visit  First visit review I reviewed new OB appt with patient. Explained pt will be seen by Myna Hidalgo, DO at first visit. Discussed Avelina Laine genetic screening with patient, ordered today . Routine prenatal labs ordered today.    Jobe Marker, RN 01/21/2023  11:05 AM

## 2023-01-23 LAB — STATUS REPORT

## 2023-01-24 LAB — URINE CULTURE

## 2023-01-28 ENCOUNTER — Encounter: Payer: Self-pay | Admitting: Obstetrics & Gynecology

## 2023-01-28 ENCOUNTER — Ambulatory Visit: Payer: BC Managed Care – PPO | Admitting: Obstetrics & Gynecology

## 2023-01-28 ENCOUNTER — Other Ambulatory Visit (HOSPITAL_COMMUNITY)
Admission: RE | Admit: 2023-01-28 | Discharge: 2023-01-28 | Disposition: A | Discharge status: Home / Self Care | Payer: BC Managed Care – PPO | Source: Ambulatory Visit | Attending: Obstetrics & Gynecology | Admitting: Obstetrics & Gynecology

## 2023-01-28 VITALS — BP 108/77 | HR 78 | Wt 277.0 lb

## 2023-01-28 DIAGNOSIS — E1165 Type 2 diabetes mellitus with hyperglycemia: Secondary | ICD-10-CM | POA: Diagnosis not present

## 2023-01-28 DIAGNOSIS — O24319 Unspecified pre-existing diabetes mellitus in pregnancy, unspecified trimester: Secondary | ICD-10-CM

## 2023-01-28 DIAGNOSIS — R87610 Atypical squamous cells of undetermined significance on cytologic smear of cervix (ASC-US): Secondary | ICD-10-CM | POA: Diagnosis not present

## 2023-01-28 DIAGNOSIS — O24311 Unspecified pre-existing diabetes mellitus in pregnancy, first trimester: Secondary | ICD-10-CM

## 2023-01-28 DIAGNOSIS — Z124 Encounter for screening for malignant neoplasm of cervix: Secondary | ICD-10-CM

## 2023-01-28 DIAGNOSIS — Z98891 History of uterine scar from previous surgery: Secondary | ICD-10-CM | POA: Insufficient documentation

## 2023-01-28 DIAGNOSIS — O0991 Supervision of high risk pregnancy, unspecified, first trimester: Secondary | ICD-10-CM | POA: Diagnosis not present

## 2023-01-28 DIAGNOSIS — O0992 Supervision of high risk pregnancy, unspecified, second trimester: Secondary | ICD-10-CM

## 2023-01-28 DIAGNOSIS — Z1151 Encounter for screening for human papillomavirus (HPV): Secondary | ICD-10-CM | POA: Diagnosis not present

## 2023-01-28 DIAGNOSIS — R87810 Cervical high risk human papillomavirus (HPV) DNA test positive: Secondary | ICD-10-CM | POA: Insufficient documentation

## 2023-01-28 DIAGNOSIS — Z3A13 13 weeks gestation of pregnancy: Secondary | ICD-10-CM

## 2023-01-28 LAB — PANORAMA PRENATAL TEST FULL PANEL:PANORAMA TEST PLUS 5 ADDITIONAL MICRODELETIONS: FETAL FRACTION: 4.2

## 2023-01-28 LAB — CBC/D/PLT+RPR+RH+ABO+RUBIGG...
Antibody Screen: NEGATIVE
Basophils Absolute: 0 10*3/uL (ref 0.0–0.2)
Basos: 0 %
EOS (ABSOLUTE): 0 10*3/uL (ref 0.0–0.4)
Eos: 1 %
HCV Ab: NONREACTIVE
HIV Screen 4th Generation wRfx: NONREACTIVE
Hematocrit: 44.1 % (ref 34.0–46.6)
Hemoglobin: 14.2 g/dL (ref 11.1–15.9)
Hepatitis B Surface Ag: NEGATIVE
Immature Grans (Abs): 0 10*3/uL (ref 0.0–0.1)
Immature Granulocytes: 0 %
Lymphocytes Absolute: 1.9 10*3/uL (ref 0.7–3.1)
Lymphs: 26 %
MCH: 28.3 pg (ref 26.6–33.0)
MCHC: 32.2 g/dL (ref 31.5–35.7)
MCV: 88 fL (ref 79–97)
Monocytes Absolute: 0.4 10*3/uL (ref 0.1–0.9)
Monocytes: 5 %
Neutrophils Absolute: 5 10*3/uL (ref 1.4–7.0)
Neutrophils: 68 %
Platelets: 252 10*3/uL (ref 150–450)
RBC: 5.02 x10E6/uL (ref 3.77–5.28)
RDW: 13.7 % (ref 11.7–15.4)
RPR Ser Ql: NONREACTIVE
Rh Factor: POSITIVE
Rubella Antibodies, IGG: 2.75 {index} (ref >0.99)
WBC: 7.4 10*3/uL (ref 3.4–10.8)

## 2023-01-28 LAB — INTEGRATED 1
Crown Rump Length: 61.9 mm
Gest. Age on Collection Date: 12.4 wk
PAPP-A Value: 305.4 ng/mL
Race: 1
Sonographer ID#: 27.6 a
Sonographer ID#: 309760
Weight: 1.4 mm
Weight: 276 [lb_av]

## 2023-01-28 LAB — PROTEIN / CREATININE RATIO, URINE
Creatinine, Urine: 265.8 mg/dL
Protein, Ur: 16.1 mg/dL
Protein/Creat Ratio: 61 mg/g{creat} (ref 0–200)

## 2023-01-28 LAB — TSH: TSH: 0.724 u[IU]/mL (ref 0.450–4.500)

## 2023-01-28 LAB — HCV INTERPRETATION

## 2023-01-28 MED ORDER — ASPIRIN 81 MG PO TBEC
81.0000 mg | DELAYED_RELEASE_TABLET | Freq: Every day | ORAL | 12 refills | Status: DC
Start: 2023-01-28 — End: 2023-07-17

## 2023-01-28 NOTE — Progress Notes (Signed)
INITIAL OBSTETRICAL VISIT Patient name: Allison Friedman MRN [id number]  Date of birth: 1996-01-29 Chief Complaint:   Initial Prenatal Visit  History of Present Illness:   Allison Friedman is a 27 y.o. G22P1011  female at [redacted]w[redacted]d by [place] at 5 weeks with an Estimated Date of Delivery: 08/05/23 being seen today for her initial obstetrical visit.   Her obstetrical history is significant for the following:    -Type 2 DM (Class B) Being followed by endocrinology [place] runnings 80-85s 0.4u basal  -Prior C-section for arrest of dilation Pt notes that it was an "awful experience" and states she was "suffering"  She would consider TOLAC if she goes into labor Reviewed that due to DM likely IOL indicated After reviewing her options would prefer Repeat C-section and salpingectomy- she strongly does not desire another pregnancy and recognizes   -Obesity -Anxiety/Depression- no meds   Today she reports  some mild cramping . Denies vaginal bleeding.     01/21/2023   10:53 AM 12/03/2022    2:11 PM 08/01/2022    3:03 PM 06/13/2022    9:41 AM 05/22/2022   11:06 AM  Depression screen PHQ 2/9  Decreased Interest 0 0 0 0 0  Down, Depressed, Hopeless 0 1 0 1 1  PHQ - 2 Score 0 1 0 1 1  Altered sleeping 2 2 2 2  0  Tired, decreased energy 1 1 2 2 2   Change in appetite 1 1 2 2 2   Feeling bad or failure about yourself  0 0 0 0 0  Trouble concentrating 0 0 0 0 0  Moving slowly or fidgety/restless 0 0 0 0 0  Suicidal thoughts 0 0 0 0 0  PHQ-9 Score 4 5 6 7 5   Difficult doing work/chores  Not difficult at all Not difficult at all Not difficult at all Not difficult at all    Patient's last menstrual period was 10/09/2022 (exact date). -pap completed today  Review of Systems:   Pertinent items are noted in HPI Denies cramping/contractions, leakage of fluid, vaginal bleeding, abnormal vaginal discharge w/ itching/odor/irritation, headaches, visual changes, shortness of breath, chest pain, abdominal pain,  severe nausea/vomiting, or problems with urination or bowel movements unless otherwise stated above.  Pertinent History Reviewed:  Reviewed past medical,surgical, social, obstetrical and family history.  Reviewed problem list, medications and allergies. OB History  Gravida Para Term Preterm AB Living  3 1 1   1 1   SAB IAB Ectopic Multiple Live Births  1       1    # Outcome Date GA Lbr Len/2nd Weight Sex Type Anes PTL Lv  3 Current           2 Term 05/08/21 [redacted]w[redacted]d  6 lb 12 oz (3.062 kg) F CS-LTranv Spinal N LIV     Complications: Failure to Progress in First Stage  1 SAB 2017           Physical Assessment:   Vitals:   01/28/23 1037  BP: 108/77  Pulse: 78  Weight: 277 lb (125.6 kg)  Body mass index is 47.55 kg/m.       Physical Examination:  General appearance - well appearing, and in no distress  Mental status - alert, oriented to person, place, and time  Psych:  She has a normal mood and affect  Skin - warm and dry, normal color, no suspicious lesions noted  Chest - effort normal, all lung fields clear to auscultation bilaterally  Breast- no masses  or abnormalities appreciated.  Mild prior scarring noted in bilateral axilla  Heart - normal rate and regular rhythm  Abdomen - soft, nontender  Extremities:  No swelling or varicosities noted  Pelvic - VULVA: normal appearing vulva with no masses, tenderness or lesions  VAGINA: normal appearing vagina with normal color and discharge, no lesions  CERVIX: normal appearing cervix without discharge or lesions, no CMT  Thin prep pap is done with HR HPV cotesting  Chaperone: Faith Rogue    NT yesterday- normal  No results found for this or any previous visit (from the past 24 hour(s)).  Assessment & Plan:  1) High-Risk Pregnancy G3P1011 at [redacted]w[redacted]d with an Estimated Date of Delivery: 08/05/23   2) Initial OB visit -plan to start ASA daily  3) Class B DM -pt now being seen at endocrinology- next appt on 12/11 -pt was not aware  of current insulin regimen []  plan to write down after her next visit -discussed importance of glycemic control during pregnancy -plan for ECHO and antepartum testing, briefly discussed IOL as mentioned above, pt considering repeat C-section  4) Desires sterilization -plan to complete paperwork closer to delivery   Meds:  Meds ordered this encounter  Medications   aspirin EC 81 MG tablet    Sig: Take 1 tablet (81 mg total) by mouth daily. Swallow whole.    Dispense:  30 tablet    Refill:  12    Initial labs obtained Continue prenatal vitamins Reviewed n/v relief measures and warning s/s to report Reviewed recommended weight gain based on pre-gravid BMI Encouraged well-balanced diet Genetic & carrier screening discussed: requests Panorama and NT/IT,  Ultrasound discussed; fetal survey: results reviewed CCNC completed> form faxed if has or is planning to apply for medicaid The nature of [facility] [employer] with multiple MDs and other Advanced Practice Providers was explained to patient; also emphasized that fellows, residents, and students are part of our team.  Indications for ASA therapy (per uptodate) One of the following: H/O preeclampsia, especially early onset/adverse outcome No Multifetal gestation No CHTN No T1DM or T2DM Yes Chronic kidney disease No Autoimmune disease (antiphospholipid syndrome, systemic lupus erythematosus) No  Indications for early A1C (per uptodate) Pt already diabetic  Follow-up: Return in about 4 weeks (around 02/25/2023) for HROB visit.   Orders Placed This Encounter  Procedures   US OB Comp + 14 Wk    Myna Hidalgo, DO Attending Obstetrician & Gynecologist, [facility] for [employer], [facility]

## 2023-02-01 LAB — HORIZON CUSTOM: REPORT SUMMARY: NEGATIVE

## 2023-02-04 ENCOUNTER — Encounter: Payer: Self-pay | Admitting: Nutrition

## 2023-02-04 ENCOUNTER — Ambulatory Visit: Payer: BC Managed Care – PPO | Admitting: Nurse Practitioner

## 2023-02-04 ENCOUNTER — Encounter: Payer: BC Managed Care – PPO | Attending: Women's Health | Admitting: Nutrition

## 2023-02-04 ENCOUNTER — Encounter: Payer: Self-pay | Admitting: Nurse Practitioner

## 2023-02-04 VITALS — Ht 64.0 in | Wt 273.0 lb

## 2023-02-04 VITALS — BP 113/78 | Ht 64.0 in | Wt 275.4 lb

## 2023-02-04 DIAGNOSIS — E1169 Type 2 diabetes mellitus with other specified complication: Secondary | ICD-10-CM | POA: Insufficient documentation

## 2023-02-04 DIAGNOSIS — Z794 Long term (current) use of insulin: Secondary | ICD-10-CM | POA: Diagnosis not present

## 2023-02-04 DIAGNOSIS — O24119 Pre-existing diabetes mellitus, type 2, in pregnancy, unspecified trimester: Secondary | ICD-10-CM | POA: Insufficient documentation

## 2023-02-04 DIAGNOSIS — E119 Type 2 diabetes mellitus without complications: Secondary | ICD-10-CM

## 2023-02-04 DIAGNOSIS — E785 Hyperlipidemia, unspecified: Secondary | ICD-10-CM

## 2023-02-04 DIAGNOSIS — Z3A14 14 weeks gestation of pregnancy: Secondary | ICD-10-CM | POA: Diagnosis not present

## 2023-02-04 DIAGNOSIS — K760 Fatty (change of) liver, not elsewhere classified: Secondary | ICD-10-CM | POA: Diagnosis not present

## 2023-02-04 MED ORDER — OMNIPOD 5 DEXG7G6 PODS GEN 5 MISC: 6 refills | Status: DC

## 2023-02-04 MED ORDER — DEXCOM G7 SENSOR MISC: 1.0000 | 11 refills | Status: DC

## 2023-02-04 NOTE — Patient Instructions (Signed)
Goals  Eat meals on time and don't skip meals Choose healthier snacks instead of junk food Try the mini bag of popcorn with nuts for snacks.

## 2023-02-04 NOTE — Progress Notes (Signed)
Endocrinology Follow Up Note       02/04/2023, 11:24 AM   Subjective:    Patient ID: Allison Friedman, female    DOB: 09-16-1995.  Allison Friedman is being seen in follow up after being seen in consultation for management of currently uncontrolled symptomatic diabetes requested by  Gabriel Earing, FNP.   Past Medical History:  Diagnosis Date   Anemia    Anxiety    Childhood asthma    Depression    Diabetes mellitus without complication (HCC)    Elevated lipase 10/25/2021   Migraines    Palpitations     Past Surgical History:  Procedure Laterality Date   CESAREAN SECTION N/A 05/08/2021   Procedure: CESAREAN SECTION;  Surgeon: Toy Baker, DO;  Location: [facility] LD ORS;  Service: Obstetrics;  Laterality: N/A;   CYST REMOVAL NECK     infected sweat gland - cyst removal on the chest   NECK SURGERY      Social History   Socioeconomic History   Marital status: Married    Spouse name: Reuel Boom   Number of children: 1   Years of education: 12   Highest education level: GED or equivalent  Occupational History   Not on file  Tobacco Use   Smoking status: Never   Smokeless tobacco: Never  Vaping Use   Vaping status: Never Used  Substance and Sexual Activity   Alcohol use: Never   Drug use: Never   Sexual activity: Yes  Other Topics Concern   Not on file  Social History Narrative   Not on file   Social Determinants of Health   Financial Resource Strain: Low Risk  (01/21/2023)   Overall Financial Resource Strain (CARDIA)    Difficulty of Paying Living Expenses: Not hard at all  Food Insecurity: No Food Insecurity (01/21/2023)   Hunger Vital Sign    Worried About Running Out of Food in the Last Year: Never true    Ran Out of Food in the Last Year: Never true  Transportation Needs: No Transportation Needs (01/21/2023)   PRAPARE - Administrator, Civil Service (Medical): No    Lack of  Transportation (Non-Medical): No  Physical Activity: Insufficiently Active (01/21/2023)   Exercise Vital Sign    Days of Exercise per Week: 2 days    Minutes of Exercise per Session: 30 min  Stress: No Stress Concern Present (01/21/2023)   [employer] of Occupational Health - Occupational Stress Questionnaire    Feeling of Stress : Not at all  Social Connections: Moderately Isolated (01/21/2023)   Social Connection and Isolation Panel [NHANES]    Frequency of Communication with Friends and Family: More than three times a week    Frequency of Social Gatherings with Friends and Family: Once a week    Attends Religious Services: Never    Database administrator or Organizations: No    Attends Banker Meetings: Never    Marital Status: Married    Family History  Problem Relation Age of Onset   Depression Mother    Arthritis Mother    Anxiety disorder Mother    Diabetes Mother    Hypertension Mother  Diabetes Father    Alcohol abuse Father    Hypertension Father    Congenital Murmur Father    Heart attack Father        late 64's or early 32's   Hearing loss Brother        born deaf   Asthma Brother    Ovarian cancer Maternal Grandmother    Colon cancer Maternal Grandfather     Outpatient Encounter Medications as of 02/04/2023  Medication Sig   Accu-Chek Softclix Lancets lancets Use as instructed to check blood sugar 4 times daily   aspirin EC 81 MG tablet Take 1 tablet (81 mg total) by mouth daily. Swallow whole.   insulin lispro (HUMALOG) 100 UNIT/ML injection Use with Omnipod for TDD around 60 units daily.   Insulin Syringe-Needle U-100 (INSULIN SYRINGE .3CC/29GX1/2") 29G X 1/2" 0.3 ML MISC 1 Units by Does not apply route 2 (two) times daily.   linaclotide (LINZESS) 145 MCG CAPS capsule Take 1 capsule (145 mcg total) by mouth daily before breakfast.   Prenatal Vit-Fe Fumarate-FA (PRENATAL MULTIVITAMIN) TABS tablet Take 1 tablet by mouth daily at 12 noon.    [DISCONTINUED] Continuous Glucose Sensor (DEXCOM G7 SENSOR) MISC Inject 1 Application into the skin as directed. Change sensor every 10 days as directed.   [DISCONTINUED] Insulin Disposable Pump (OMNIPOD 5 DEXG7G6 PODS GEN 5) MISC Change pod every 72 hours   Continuous Glucose Sensor (DEXCOM G7 SENSOR) MISC Inject 1 Application into the skin as directed. Change sensor every 10 days as directed.   Insulin Disposable Pump (OMNIPOD 5 DEXG7G6 PODS GEN 5) MISC Change pod every 72 hours   [DISCONTINUED] insulin NPH Human (HUMULIN N) 100 UNIT/ML injection Inject 0.05 mLs (5 Units total) into the skin in the morning and at bedtime. (Patient not taking: Reported on 02/04/2023)   No facility-administered encounter medications on file as of 02/04/2023.    ALLERGIES: Allergies  Allergen Reactions   Metformin And Related Diarrhea    Intestinal cramps   Prozac [Fluoxetine] Other (See Comments)    Reports suicidal thoughts with prozac or xanax--not willing to risk being on either medication   Xanax [Alprazolam] Other (See Comments)    Reports suicidal thoughts with prozac or xanax--not willing to risk being on either medication     VACCINATION STATUS: Immunization History  Administered Date(s) Administered   Influenza,inj,Quad PF,6+ Mos 11/13/2021   Influenza-Unspecified 12/05/2020   Moderna Sars-Covid-2 Vaccination 06/07/2019, 06/28/2019   PFIZER Comirnaty(Gray Top)Covid-19 Tri-Sucrose Vaccine 06/02/2019, 06/27/2019    Diabetes She presents for her follow-up diabetic visit. She has type 2 diabetes mellitus. Onset time: diagnosed at a approx age of 45. Her disease course has been improving. There are no hypoglycemic associated symptoms. There are no diabetic associated symptoms. There are no hypoglycemic complications. There are no diabetic complications. Risk factors for coronary artery disease include diabetes mellitus, family history, obesity and dyslipidemia. Current diabetic treatment  includes insulin pump (was previously on Ozempic but stopped after positive pregnancy test). She is compliant with treatment most of the time. Her weight is fluctuating minimally. She is following a generally healthy diet. When asked about meal planning, she reported none. She has not had a previous visit with a dietitian. She participates in exercise intermittently. Her home blood glucose trend is decreasing steadily. (She presents today with her pump and CGM showing mostly at target glycemic profile.  She was not due for another A1c today.  She is adjusting well to the Omnipod 5 in manual mode.  She notes her glucose still jumps up after eating a bit too high and she also notes she would like to indulge in some of her pregnancy cravings but is scared as she doesn't want to complicate her or her babys health.  Analysis of her CGM shows TIR 97%, TAR 2%, TBR 1% with a GMI of 5.6%.) An ACE inhibitor/angiotensin II receptor blocker is not being taken. She does not see a podiatrist.Eye exam is current.     Review of systems  Constitutional: + stable body weight,  current Body mass index is 47.27 kg/m. , no fatigue, no subjective hyperthermia, no subjective hypothermia Eyes: no blurry vision, no xerophthalmia ENT: no sore throat, no nodules palpated in throat, no dysphagia/odynophagia, no hoarseness Cardiovascular: no chest pain, no shortness of breath, no palpitations, no leg swelling Respiratory: no cough, no shortness of breath Gastrointestinal: no nausea/vomiting/diarrhea Musculoskeletal: no muscle/joint aches Skin: no rashes, no hyperemia Neurological: no tremors, no numbness, no tingling, no dizziness Psychiatric: no depression, no anxiety  Objective:     BP 113/78 (BP Location: Right Arm, Patient Position: Sitting, Cuff Size: Large)   Ht 5\' 4"  (1.626 m)   Wt 275 lb 6.4 oz (124.9 kg)   LMP 10/09/2022 (Exact Date) Comment: early ultrasound on 12/09/22    size < dates  EDD by early [place] =  6-11---25  BMI 47.27 kg/m   Wt Readings from Last 3 Encounters:  02/04/23 273 lb (123.8 kg)  02/04/23 275 lb 6.4 oz (124.9 kg)  01/28/23 277 lb (125.6 kg)     BP Readings from Last 3 Encounters:  02/04/23 113/78  01/28/23 108/77  01/21/23 105/61      Physical Exam- Limited  Constitutional:  Body mass index is 47.27 kg/m. , not in acute distress, normal state of mind Eyes:  EOMI, no exophthalmos Musculoskeletal: no gross deformities, strength intact in all four extremities, no gross restriction of joint movements Skin:  no rashes, no hyperemia Neurological: no tremor with outstretched hands   Diabetic Foot Exam - Simple   No data filed      CMP ( most recent) CMP     Component Value Date/Time   NA 140 08/25/2022 1505   K 4.5 08/25/2022 1505   CL 105 08/25/2022 1505   CO2 23 08/25/2022 1505   GLUCOSE 92 08/25/2022 1505   GLUCOSE 219 (H) 05/07/2021 0035   BUN 13 08/25/2022 1505   CREATININE 0.78 08/25/2022 1505   CALCIUM 8.9 08/25/2022 1505   PROT 7.2 08/25/2022 1505   ALBUMIN 4.2 08/25/2022 1505   AST 18 08/25/2022 1505   ALT 27 08/25/2022 1505   ALKPHOS 82 08/25/2022 1505   BILITOT <0.2 08/25/2022 1505   EGFR 107 08/25/2022 1505   GFRNONAA >60 12/22/2020 0951     Diabetic Labs (most recent): Lab Results  Component Value Date   HGBA1C 6.5 (A) 01/02/2023   HGBA1C 6.4 (H) 08/25/2022   HGBA1C 6.0 (H) 05/22/2022     Lipid Panel ( most recent) Lipid Panel     Component Value Date/Time   CHOL 158 02/20/2022 1050   TRIG 188 (H) 02/20/2022 1050   HDL 34 (L) 02/20/2022 1050   CHOLHDL 4.6 (H) 02/20/2022 1050   LDLCALC 92 02/20/2022 1050   LABVLDL 32 02/20/2022 1050      Lab Results  Component Value Date   TSH 0.724 01/21/2023           Assessment & Plan:   1) Type 2 diabetes mellitus without  complication, without long-term current use of insulin (HCC) 2) Pre-existing type 2 diabetes mellitus during pregnancy, antepartum  She presents  today with her pump and CGM showing mostly at target glycemic profile.  She was not due for another A1c today.  She is adjusting well to the Omnipod 5 in manual mode.  She notes her glucose still jumps up after eating a bit too high and she also notes she would like to indulge in some of her pregnancy cravings but is scared as she doesn't want to complicate her or her babys health.  Analysis of her CGM shows TIR 97%, TAR 2%, TBR 1% with a GMI of 5.6%.  - Allison Friedman has currently uncontrolled symptomatic type 2 DM since 27 years of age, with most recent A1c of 6.5 %.   -Recent labs reviewed.  - I had a long discussion with her about the progressive nature of diabetes and the pathology behind its complications. -her diabetes is not currently complicated but she remains at a high risk for more acute and chronic complications which include CAD, CVA, CKD, retinopathy, and neuropathy. These are all discussed in detail with her.  The following Lifestyle Medicine recommendations according to American College of Lifestyle Medicine [facility]) were discussed and offered to patient and she agrees to start the journey:  A. Whole Foods, Plant-based plate comprising of fruits and vegetables, plant-based proteins, whole-grain carbohydrates was discussed in detail with the patient.   A list for source of those nutrients were also provided to the patient.  Patient will use only water or unsweetened tea for hydration. B.  The need to stay away from risky substances including alcohol, smoking; obtaining 7 to 9 hours of restorative sleep, at least 150 minutes of moderate intensity exercise weekly, the importance of healthy social connections,  and stress reduction techniques were discussed. C.  A full color page of  Calorie density of various food groups per pound showing examples of each food groups was provided to the patient.  - Nutritional counseling repeated at each appointment due to patients tendency to fall back in  to old habits.  - The patient admits there is a room for improvement in their diet and drink choices. -  Suggestion is made for the patient to avoid simple carbohydrates from their diet including Cakes, Sweet Desserts / Pastries, Ice Cream, Soda (diet and regular), Sweet Tea, Candies, Chips, Cookies, Sweet Pastries, Store Bought Juices, Alcohol in Excess of 1-2 drinks a day, Artificial Sweeteners, Coffee Creamer, and "Sugar-free" Products. This will help patient to have stable blood glucose profile and potentially avoid unintended weight gain.   - I encouraged the patient to switch to unprocessed or minimally processed complex starch and increased protein intake (animal or plant source), fruits, and vegetables.   - Patient is advised to stick to a routine mealtimes to eat 3 meals a day and avoid unnecessary snacks (to snack only to correct hypoglycemia).  - I have approached her with the following individualized plan to manage her diabetes and patient agrees:   -She is currently using her Omnipod 5 in manual mode as her phone is not compatible with the [facility] app currently.  I did make slight adjustment to her settings today, turning off reverse correction and also lowering her insulin carb ratio to 1:7.  This may help drive prandial readings down some.  I did give her a print out of her pump settings to take with her to the OBGYN as she was requesting it.  -  she is encouraged to continue monitoring glucose 4 times daily (using her CGM), before meals and before bed, and to call the clinic if her glucose is below 60 or above 180 for 3 tests in a row.  - she is warned not to take insulin without proper monitoring per orders. - Adjustment parameters are given to her for hypo and hyperglycemia in writing.  -She is not a candidate for incretin therapy at this time due to pregnancy.  Insulin is the safest treatment for her and the developing baby.  Once she has delivered the baby, she will be able to  transition back to Ozempic as she was on previously.  - Specific targets for  A1c; LDL, HDL, and Triglycerides were discussed with the patient.  3) Blood Pressure /Hypertension:  her blood pressure is controlled to target without the use of antihypertensive medications.    4) Lipids/Hyperlipidemia:    Review of her recent lipid panel from 02/20/22 showed controlled LDL at 92 and elevated triglycerides of 188.  She is not currently on any lipid lowering medications.   5)  Weight/Diet:  her Body mass index is 47.27 kg/m.  -  clearly complicating her diabetes care.   she is a candidate for weight loss. I discussed with her the fact that loss of 5 - 10% of her  current body weight will have the most impact on her diabetes management.  Exercise, and detailed carbohydrates information provided  -  detailed on discharge instructions.  6) Chronic Care/Health Maintenance: -she is not on ACEI/ARB or Statin medications and is encouraged to initiate and continue to follow up with Ophthalmology, Dentist, Podiatrist at least yearly or according to recommendations, and advised to stay away from smoking. I have recommended yearly flu vaccine and pneumonia vaccine at least every 5 years; moderate intensity exercise for up to 150 minutes weekly; and sleep for at least 7 hours a day.  - she is advised to maintain close follow up with Gabriel Earing, FNP for primary care needs, as well as her other providers for optimal and coordinated care.    I spent  45  minutes in the care of the patient today including review of labs from CMP, Lipids, Thyroid Function, Hematology (current and previous including abstractions from other facilities); face-to-face time discussing  her blood glucose readings/logs, discussing hypoglycemia and hyperglycemia episodes and symptoms, medications doses, her options of short and long term treatment based on the latest standards of care / guidelines;  discussion about incorporating  lifestyle medicine;  and documenting the encounter. Risk reduction counseling performed per USPSTF guidelines to reduce obesity and cardiovascular risk factors.     Please refer to Patient Instructions for Blood Glucose Monitoring and Insulin/Medications Dosing Guide"  in media tab for additional information. Please  also refer to " Patient Self Inventory" in the Media  tab for reviewed elements of pertinent patient history.  Allison Friedman participated in the discussions, expressed understanding, and voiced agreement with the above plans.  All questions were answered to her satisfaction. she is encouraged to contact clinic should she have any questions or concerns prior to her return visit.     Follow up plan: - Return in about 4 weeks (around 03/04/2023) for Diabetes F/U, Bring meter and logs.   Ronny Bacon, [facility] [address] [place], [place] [address] Phone: [phone] Fax: [phone]  02/04/2023, 11:24 AM

## 2023-02-04 NOTE — Progress Notes (Signed)
Medical Nutrition Therapy  Appointment Start time:  3  Appointment End time:  1030  Primary concerns today: DM Type 2, Obesity  Referral diagnosis: E11.8, E66.01 Preferred learning style: No preference  Learning readiness: Ready    NUTRITION ASSESSMENT Follow up Is expecting and is [redacted] weeks pregnant. Is having a boy. Second child.  Is on the Dexcom and Omnipod.  Saw Whitney today. Is on Omnipod now. Basal rate is 4 u per hour 1: 7 insulin/carb ratio. She notes it's hard to eat healthy. Not able to exercise a a lot due to sharp pains in her back.  Better control with Dexcom and Omnipod. Feels better. In good spirits today.   Anthropometrics  Wt Readings from Last 3 Encounters:  02/04/23 275 lb 6.4 oz (124.9 kg)  01/28/23 277 lb (125.6 kg)  01/21/23 276 lb (125.2 kg)   Ht Readings from Last 3 Encounters:  02/04/23 5\' 4"  (1.626 m)  01/02/23 5\' 4"  (1.626 m)  01/01/23 5\' 4"  (1.626 m)   There is no height or weight on file to calculate BMI. @BMIFA @ Facility age limit for growth %iles is 20 years. Facility age limit for growth %iles is 20 years.   Clinical Medical Hx: See chart Medications:  Current Outpatient Medications on File Prior to Visit  Medication Sig Dispense Refill   Accu-Chek Softclix Lancets lancets Use as instructed to check blood sugar 4 times daily 100 each 12   aspirin EC 81 MG tablet Take 1 tablet (81 mg total) by mouth daily. Swallow whole. 30 tablet 12   Continuous Glucose Sensor (DEXCOM G7 SENSOR) MISC Inject 1 Application into the skin as directed. Change sensor every 10 days as directed. 3 each 11   Insulin Disposable Pump (OMNIPOD 5 DEXG7G6 PODS GEN 5) MISC Change pod every 72 hours 10 each 6   insulin lispro (HUMALOG) 100 UNIT/ML injection Use with Omnipod for TDD around 60 units daily. 40 mL 3   Insulin Syringe-Needle U-100 (INSULIN SYRINGE .3CC/29GX1/2") 29G X 1/2" 0.3 ML MISC 1 Units by Does not apply route 2 (two) times daily. 200 each 6    linaclotide (LINZESS) 145 MCG CAPS capsule Take 1 capsule (145 mcg total) by mouth daily before breakfast. 30 capsule 5   Prenatal Vit-Fe Fumarate-FA (PRENATAL MULTIVITAMIN) TABS tablet Take 1 tablet by mouth daily at 12 noon.     No current facility-administered medications on file prior to visit.    Labs:  Lab Results  Component Value Date   HGBA1C 6.5 (A) 01/02/2023      Latest Ref Rng & Units 08/25/2022    3:05 PM 05/22/2022   11:10 AM 02/20/2022   10:50 AM  CMP  Glucose 70 - 99 mg/dL 92  528  413   BUN 6 - 20 mg/dL 13  14  8    Creatinine 0.57 - 1.00 mg/dL 2.44  0.10  2.72   Sodium 134 - 144 mmol/L 140  139  139   Potassium 3.5 - 5.2 mmol/L 4.5  4.6  4.2   Chloride 96 - 106 mmol/L 105  102  100   CO2 20 - 29 mmol/L 23  22  22    Calcium 8.7 - 10.2 mg/dL 8.9  9.2  9.5   Total Protein 6.0 - 8.5 g/dL 7.2  7.2  7.5   Total Bilirubin 0.0 - 1.2 mg/dL <5.3  0.3  <6.6   Alkaline Phos 44 - 121 IU/L 82  81  93   AST 0 -  40 IU/L 18  25  24    ALT 0 - 32 IU/L 27  36  45    Lipid Panel     Component Value Date/Time   CHOL 158 02/20/2022 1050   TRIG 188 (H) 02/20/2022 1050   HDL 34 (L) 02/20/2022 1050   CHOLHDL 4.6 (H) 02/20/2022 1050   LDLCALC 92 02/20/2022 1050   LABVLDL 32 02/20/2022 1050    Notable Signs/Symptoms: none  Lifestyle & Dietary Hx Lives with her husband. Stays at home with her daughter.  Has been on depression medicaiton Estimated daily fluid intake: 40 oz Supplements: None Sleep: good Stress / self-care: her health and PTSD Current average weekly physical activity: Walking some  24-Hr Dietary Recall Working on eating 3 meals and 3 snacks per day on time.  Estimated Energy Needs Calories: 1800 Carbohydrate: 200g Protein: 135g Fat: 56g   NUTRITION DIAGNOSIS  NB-1.1 Food and nutrition-related knowledge deficit As related to high calorie high carb diet.  As evidenced by BMI 48 and history of eating high calorie foods..   NUTRITION INTERVENTION   Nutrition education (E-1) on the following topics:   Gestational DM  States the definition of Gestational Diabetes States why dietary management is important in controlling blood glucose Describes the effects each nutrient has on blood glucose levels Demonstrates ability to create a balanced meal plan Demonstrates carbohydrate counting  States when to check blood glucose levels Demonstrates proper blood glucose monitoring techniques States the effect of stress and exercise on blood glucose levels States the importance of limiting caffeine and abstaining from alcohol and smoking Is on Dexom and Omnipod    *Patient received handouts: Nutrition Diabetes and Pregnancy Carbohydrate Counting List  Lifestyle Medicine  - Whole Food, Plant Predominant Nutrition is highly recommended: Eat Plenty of vegetables, Mushrooms, fruits, Legumes, Whole Grains, Nuts, seeds in lieu of processed meats, processed snacks/pastries red meat, poultry, eggs.    -It is better to avoid simple carbohydrates including: Cakes, Sweet Desserts, Ice Cream, Soda (diet and regular), Sweet Tea, Candies, Chips, Cookies, Store Bought Juices, Alcohol in Excess of  1-2 drinks a day, Lemonade,  Artificial Sweeteners, Doughnuts, Coffee Creamers, "Sugar-free" Products, etc, etc.  This is not a complete list.....  Exercise: If you are able: 30 -60 minutes a day ,4 days a week, or 150 minutes a week.  The longer the better.  Combine stretch, strength, and aerobic activities.  If you were told in the past that you have high risk for cardiovascular diseases, you may seek evaluation by your heart doctor prior to initiating moderate to intense exercise programs.   Handouts Provided Include  Lifestyle Medicine Gestational DM  Learning Style & Readiness for Change Teaching method utilized: Visual & Auditory  Demonstrated degree of understanding via: Teach Back  Barriers to learning/adherence to lifestyle change: none  Goals  Established by Pt Goals  Eat meals on time and don't skip meals Choose healthier snacks instead of junk food Try the mini bag of popcorn with nuts for snacks.  .Patient instructed to monitor glucose levels: FBS: 60 - <90 1 hour: <140 2 hour: <120   MONITORING & EVALUATION Dietary intake, weekly physical activity, and weight  in 1 month.  Next Steps  Patient is to work on meal planning and eating more whole plant based foods.Marland Kitchen

## 2023-02-06 ENCOUNTER — Telehealth: Payer: Self-pay

## 2023-02-06 NOTE — Telephone Encounter (Signed)
Pharmacy Patient Advocate Encounter  Received notification from [facility] that Prior Authorization for Omnipods has been DENIED.  Full denial letter will be uploaded to the media tab. See denial reason below.

## 2023-02-09 ENCOUNTER — Encounter: Payer: Self-pay | Admitting: Nurse Practitioner

## 2023-02-10 ENCOUNTER — Telehealth: Payer: BC Managed Care – PPO | Admitting: Nurse Practitioner

## 2023-02-10 MED ORDER — OMNIPOD DASH PODS (GEN 4) MISC: 6 refills | Status: DC

## 2023-02-10 MED ORDER — OMNIPOD DASH INTRO (GEN 4) KIT: PACK | 0 refills | Status: DC

## 2023-02-10 NOTE — Telephone Encounter (Signed)
An appeal for Omnipod has be done by peer to peer. [phone] option 2 then option 5. I did fax all the records we had.

## 2023-02-10 NOTE — Addendum Note (Signed)
Addended by: Dani Gobble on: 02/10/2023 12:58 PM   Modules accepted: Orders

## 2023-02-10 NOTE — Telephone Encounter (Signed)
 Hallelujah!

## 2023-02-10 NOTE — Telephone Encounter (Signed)
[employer] approved the [employer]. Pt was notified.

## 2023-02-10 NOTE — Telephone Encounter (Signed)
Called [employer] requires peer to peer.

## 2023-02-10 NOTE — Telephone Encounter (Signed)
I have initiated the peer-to-peer review.  Her reference number is [id number].  They said they are sending it for review and I should be contacted within the next 48 hours to discuss.

## 2023-02-11 ENCOUNTER — Telehealth: Payer: Self-pay | Admitting: *Deleted

## 2023-02-11 LAB — CYTOLOGY - PAP
Chlamydia: NEGATIVE
Comment: NEGATIVE
Comment: NEGATIVE
Comment: NEGATIVE
Comment: NEGATIVE
Comment: NORMAL
Diagnosis: UNDETERMINED — AB
HPV 16: NEGATIVE
HPV 18 / 45: POSITIVE — AB
High risk HPV: POSITIVE — AB
Neisseria Gonorrhea: NEGATIVE

## 2023-02-11 NOTE — Telephone Encounter (Signed)
Patient's pharmacy called, they said that this was covered, Omnipod Dash Kit - Gen 4, per what you sent in yesterday.They do not have it is stock , and they called around to other pharmacies and they do not have it either. I called the [facility] and they do not have it and then followed up with [facility] and they only have the newer models. I have not talked with the patient.

## 2023-02-16 NOTE — Telephone Encounter (Signed)
Have you seen a PA for Omnipod DASH starter kit?  Patient has gestational diabetes and on insulin pump.

## 2023-02-17 ENCOUNTER — Other Ambulatory Visit (HOSPITAL_COMMUNITY): Payer: Self-pay

## 2023-02-17 ENCOUNTER — Telehealth: Payer: Self-pay | Admitting: Pharmacy Technician

## 2023-02-17 NOTE — Telephone Encounter (Signed)
PA request has been Submitted. New Encounter created for follow up. For additional info see Pharmacy Prior Auth telephone encounter from 02/17/23. (For the Omnipod 5 DexG7G6 kit & pods. Please let me know if we need to do the PA for the Omnipod 5 G7 instead. I'm not certain which ones are available.

## 2023-02-17 NOTE — Telephone Encounter (Signed)
Pharmacy Patient Advocate Encounter   Received notification from Patient Advice Request messages that prior authorization for Omnipod 5 DexG7 Pods is required/requested.   Insurance verification completed.   The patient is insured through [employer] .   Per test claim: PA required; PA submitted to above mentioned insurance via CoverMyMeds Key/confirmation #/EOC [id number] Status is pending  Pharmacy Patient Advocate Encounter   Received notification from Patient Advice Request messages that prior authorization for Omnipod 5 DexG7G6 intro kit is required/requested.   Per test claim: PA required; PA submitted to above mentioned insurance via CoverMyMeds Key/confirmation #/EOC [id number] Status is pending

## 2023-02-19 ENCOUNTER — Telehealth: Payer: Self-pay | Admitting: *Deleted

## 2023-02-19 MED ORDER — OMNIPOD DASH INTRO (GEN 4) KIT: PACK | 0 refills | Status: DC

## 2023-02-19 NOTE — Telephone Encounter (Signed)
I have not heard anything from O[employer] about discontinuing the DASH, to my knowledge I thought they were still making and using them.  The patients insurance denied the PA for Omnipod 5, said they would cover the DASH though.  I did a peer-to-peer with her insurance to try and overturn it.  I also initiated an expedited appeal with her insurance which they said could take 14 days to get a determination.  The DASH was a backup plan because she has limited supply of pods and has fear of needles (but still needs insulin during her pregnancy).

## 2023-02-19 NOTE — Telephone Encounter (Signed)
Whitney ask that I call [employer] and request that they expedite the patient's intro kit. When called I was told that they had that request but they were needing the component prescription. Per Alphonzo Lemmings - patient is to change the pod every 72 hours, dispense #30 with 11 refills. This was given verbal to the pharmacist Mih C. The pharmacist. I also shared that this request needed to get sent out as soon as possible as the patient only had 6-9 left and that she was pregnant. He was going to finish his part then call the department to get this done.

## 2023-02-19 NOTE — Addendum Note (Signed)
Addended by: Dani Gobble on: 02/19/2023 02:04 PM   Modules accepted: Orders

## 2023-02-26 ENCOUNTER — Encounter: Payer: BC Managed Care – PPO | Admitting: Obstetrics & Gynecology

## 2023-02-27 ENCOUNTER — Other Ambulatory Visit (HOSPITAL_COMMUNITY): Payer: Self-pay

## 2023-02-27 NOTE — Telephone Encounter (Signed)
 Pharmacy Patient Advocate Encounter  Received notification from [facility] that Prior Authorization for Omnipod 5 DexG7G6 Pods Gen 5 and Kit has been DENIED.  Full denial letter will be uploaded to the media tab. See denial reason below.    PA #/Case ID/Reference #: PA Case ID #: [id number], PA Case ID #: [id number]

## 2023-03-02 DIAGNOSIS — O26892 Other specified pregnancy related conditions, second trimester: Secondary | ICD-10-CM | POA: Diagnosis not present

## 2023-03-02 DIAGNOSIS — Z3A17 17 weeks gestation of pregnancy: Secondary | ICD-10-CM | POA: Diagnosis not present

## 2023-03-02 DIAGNOSIS — O209 Hemorrhage in early pregnancy, unspecified: Secondary | ICD-10-CM | POA: Diagnosis not present

## 2023-03-02 DIAGNOSIS — R109 Unspecified abdominal pain: Secondary | ICD-10-CM | POA: Diagnosis not present

## 2023-03-02 DIAGNOSIS — O24312 Unspecified pre-existing diabetes mellitus in pregnancy, second trimester: Secondary | ICD-10-CM | POA: Diagnosis not present

## 2023-03-05 ENCOUNTER — Encounter: Payer: Self-pay | Admitting: Nutrition

## 2023-03-05 ENCOUNTER — Ambulatory Visit (INDEPENDENT_AMBULATORY_CARE_PROVIDER_SITE_OTHER): Payer: BC Managed Care – PPO | Admitting: Nurse Practitioner

## 2023-03-05 ENCOUNTER — Encounter: Payer: BC Managed Care – PPO | Attending: Women's Health | Admitting: Nutrition

## 2023-03-05 ENCOUNTER — Encounter: Payer: Self-pay | Admitting: Nurse Practitioner

## 2023-03-05 VITALS — Ht 64.0 in | Wt 276.0 lb

## 2023-03-05 VITALS — BP 99/67 | HR 76 | Ht 64.0 in | Wt 276.6 lb

## 2023-03-05 DIAGNOSIS — E119 Type 2 diabetes mellitus without complications: Secondary | ICD-10-CM | POA: Diagnosis not present

## 2023-03-05 DIAGNOSIS — Z794 Long term (current) use of insulin: Secondary | ICD-10-CM

## 2023-03-05 DIAGNOSIS — O0992 Supervision of high risk pregnancy, unspecified, second trimester: Secondary | ICD-10-CM | POA: Insufficient documentation

## 2023-03-05 DIAGNOSIS — O24119 Pre-existing diabetes mellitus, type 2, in pregnancy, unspecified trimester: Secondary | ICD-10-CM

## 2023-03-05 DIAGNOSIS — K760 Fatty (change of) liver, not elsewhere classified: Secondary | ICD-10-CM | POA: Insufficient documentation

## 2023-03-05 NOTE — Patient Instructions (Signed)
 Goals Add more protein and veggies at night when extra hungry Be sure to eat protein and 15 grams of carbs at 3 pm snack to reduce hunger at night. Keep drinking water  Walk 30 minutes a day.   .Patient instructed to monitor glucose levels: FBS: 60 - <90 1 hour: <140 2 hour: <120

## 2023-03-05 NOTE — Progress Notes (Signed)
 Medical Nutrition Therapy  Appointment Start time:  [phone] Appointment End time:  0945  Primary concerns today: DM Type 2, Obesity  Referral diagnosis: E11.8, E66.01 Preferred learning style: No preference  Learning readiness: Ready    NUTRITION ASSESSMENT Follow up Dm During pregnancy Has the Omnipod and Decom. Blood sugar control is much better. To see Whitney today. She notes she gets a little hungrier at night. May be due to not eating enough of a snack at 3 pm and enough at lunch time. She is working on increasing her activity. Craves hot spicy foods. Eating more fruits, vegetables and reading food labels. Dexcom showing 91% in target range. Had a few low blood sugars but realized she had given herself too much insulin . Has been reading food labels and counting carbs well.  Anthropometrics   Wt Readings from Last 3 Encounters:  03/05/23 276 lb 9.6 oz (125.5 kg)  03/05/23 276 lb (125.2 kg)  02/04/23 273 lb (123.8 kg)   Ht Readings from Last 3 Encounters:  03/05/23 5' 4 (1.626 m)  03/05/23 5' 4 (1.626 m)  02/04/23 5' 4 (1.626 m)   Body mass index is 47.38 kg/m. @BMIFA @ Facility age limit for growth %iles is 20 years. Facility age limit for growth %iles is 20 years.  Clinical Medical Hx: See chart Medications:  Current Outpatient Medications on File Prior to Visit  Medication Sig Dispense Refill   Accu-Chek Softclix Lancets lancets Use as instructed to check blood sugar 4 times daily 100 each 12   aspirin  EC 81 MG tablet Take 1 tablet (81 mg total) by mouth daily. Swallow whole. 30 tablet 12   Continuous Glucose Sensor (DEXCOM G7 SENSOR) MISC Inject 1 Application into the skin as directed. Change sensor every 10 days as directed. 3 each 11   Insulin  Disposable Pump (OMNIPOD 5 DEXG7G6 PODS GEN 5) MISC Change pod every 72 hours 10 each 6   Insulin  Disposable Pump (OMNIPOD DASH INTRO, GEN 4,) KIT Change pod every 72 hours 1 kit 0   Insulin  Disposable Pump (OMNIPOD DASH  PODS, GEN 4,) MISC Change pod every 72 hours 10 each 6   insulin  lispro (HUMALOG ) 100 UNIT/ML injection Use with Omnipod for TDD around 60 units daily. 40 mL 3   Insulin  Syringe-Needle U-100 (INSULIN  SYRINGE .3CC/29GX1/2) 29G X 1/2 0.3 ML MISC 1 Units by Does not apply route 2 (two) times daily. 200 each 6   linaclotide  (LINZESS ) 145 MCG CAPS capsule Take 1 capsule (145 mcg total) by mouth daily before breakfast. 30 capsule 5   Prenatal Vit-Fe Fumarate-FA (PRENATAL MULTIVITAMIN) TABS tablet Take 1 tablet by mouth daily at 12 noon.     No current facility-administered medications on file prior to visit.    Labs:  Lab Results  Component Value Date   HGBA1C 6.5 (A) 01/02/2023      Latest Ref Rng & Units 08/25/2022    3:05 PM 05/22/2022   11:10 AM 02/20/2022   10:50 AM  CMP  Glucose 70 - 99 mg/dL 92  844  858   BUN 6 - 20 mg/dL 13  14  8    Creatinine 0.57 - 1.00 mg/dL 9.21  9.06  9.14   Sodium 134 - 144 mmol/L 140  139  139   Potassium 3.5 - 5.2 mmol/L 4.5  4.6  4.2   Chloride 96 - 106 mmol/L 105  102  100   CO2 20 - 29 mmol/L 23  22  22    Calcium  8.7 -  10.2 mg/dL 8.9  9.2  9.5   Total Protein 6.0 - 8.5 g/dL 7.2  7.2  7.5   Total Bilirubin 0.0 - 1.2 mg/dL <9.7  0.3  <9.7   Alkaline Phos 44 - 121 IU/L 82  81  93   AST 0 - 40 IU/L 18  25  24    ALT 0 - 32 IU/L 27  36  45    Lipid Panel     Component Value Date/Time   CHOL 158 02/20/2022 1050   TRIG 188 (H) 02/20/2022 1050   HDL 34 (L) 02/20/2022 1050   CHOLHDL 4.6 (H) 02/20/2022 1050   LDLCALC 92 02/20/2022 1050   LABVLDL 32 02/20/2022 1050    Notable Signs/Symptoms: none  Lifestyle & Dietary Hx Lives with her husband. Stays at home with her daughter.  Has been on depression medicaiton Estimated daily fluid intake: 40 oz Supplements: None Sleep: good Stress / self-care: her health and PTSD Current average weekly physical activity: Walking some  24-Hr Dietary Recall Working on eating 3 meals and 3 snacks per day on  time.  Estimated Energy Needs Calories: 1800 Carbohydrate: 200g Protein: 135g Fat: 56g   NUTRITION DIAGNOSIS  NB-1.1 Food and nutrition-related knowledge deficit As related to high calorie high carb diet.  As evidenced by BMI 48 and history of eating high calorie foods..   NUTRITION INTERVENTION  Nutrition education (E-1) on the following topics:   Gestational DM  States the definition of Gestational Diabetes States why dietary management is important in controlling blood glucose Describes the effects each nutrient has on blood glucose levels Demonstrates ability to create a balanced meal plan Demonstrates carbohydrate counting  States when to check blood glucose levels Demonstrates proper blood glucose monitoring techniques States the effect of stress and exercise on blood glucose levels States the importance of limiting caffeine and abstaining from alcohol and smoking Is on Dexom and Omnipod    *Patient received handouts: Nutrition Diabetes and Pregnancy Carbohydrate Counting List  Lifestyle Medicine  - Whole Food, Plant Predominant Nutrition is highly recommended: Eat Plenty of vegetables, Mushrooms, fruits, Legumes, Whole Grains, Nuts, seeds in lieu of processed meats, processed snacks/pastries red meat, poultry, eggs.    -It is better to avoid simple carbohydrates including: Cakes, Sweet Desserts, Ice Cream, Soda (diet and regular), Sweet Tea, Candies, Chips, Cookies, Store Bought Juices, Alcohol in Excess of  1-2 drinks a day, Lemonade,  Artificial Sweeteners, Doughnuts, Coffee Creamers, Sugar-free Products, etc, etc.  This is not a complete list.....  Exercise: If you are able: 30 -60 minutes a day ,4 days a week, or 150 minutes a week.  The longer the better.  Combine stretch, strength, and aerobic activities.  If you were told in the past that you have high risk for cardiovascular diseases, you may seek evaluation by your heart doctor prior to initiating moderate  to intense exercise programs.   Handouts Provided Include  Lifestyle Medicine Gestational DM  Learning Style & Readiness for Change Teaching method utilized: Visual & Auditory  Demonstrated degree of understanding via: Teach Back  Barriers to learning/adherence to lifestyle change: none  Goals Established by Pt Goals Add more protein and veggies at night when extra hungry Be sure to eat protein and 15 grams of carbs at 3 pm snack to reduce hunger at night. Keep drinking water  Walk 30 minutes a day.  .Patient instructed to monitor glucose levels: FBS: 60 - <90 1 hour: <140 2 hour: <120   MONITORING &  EVALUATION Dietary intake, weekly physical activity, and weight 1 month She will  also be followed by Endocrinology.  Next Steps  Patient is to work on meal planning and eating more whole plant based foods.SABRA

## 2023-03-05 NOTE — Progress Notes (Signed)
 Endocrinology Follow Up Note       03/05/2023, 10:34 AM   Subjective:    Patient ID: Allison Friedman, female    DOB: 1995/07/15.  Allison Friedman is being seen in follow up after being seen in consultation for management of currently uncontrolled symptomatic diabetes requested by  Joesph Annabella HERO, FNP.   Past Medical History:  Diagnosis Date   Anemia    Anxiety    Childhood asthma    Depression    Diabetes mellitus without complication (HCC)    Elevated lipase 10/25/2021   Migraines    Palpitations     Past Surgical History:  Procedure Laterality Date   CESAREAN SECTION N/A 05/08/2021   Procedure: CESAREAN SECTION;  Surgeon: Rendell Calton LABOR, DO;  Location: [facility] LD [facility];  Service: Obstetrics;  Laterality: N/A;   CYST REMOVAL NECK     infected sweat gland - cyst removal on the chest   NECK SURGERY      Social History   Socioeconomic History   Marital status: Married    Spouse name: Toribio   Number of children: 1   Years of education: 12   Highest education level: GED or equivalent  Occupational History   Not on file  Tobacco Use   Smoking status: Never   Smokeless tobacco: Never  Vaping Use   Vaping status: Never Used  Substance and Sexual Activity   Alcohol use: Never   Drug use: Never   Sexual activity: Yes  Other Topics Concern   Not on file  Social History Narrative   Not on file   Social Drivers of Health   Financial Resource Strain: Low Risk  (01/21/2023)   Overall Financial Resource Strain (CARDIA)    Difficulty of Paying Living Expenses: Not hard at all  Food Insecurity: No Food Insecurity (01/21/2023)   Hunger Vital Sign    Worried About Running Out of Food in the Last Year: Never true    Ran Out of Food in the Last Year: Never true  Transportation Needs: No Transportation Needs (01/21/2023)   PRAPARE - Administrator, Civil Service (Medical): No    Lack of  Transportation (Non-Medical): No  Physical Activity: Insufficiently Active (01/21/2023)   Exercise Vital Sign    Days of Exercise per Week: 2 days    Minutes of Exercise per Session: 30 min  Stress: No Stress Concern Present (01/21/2023)   [employer] of Occupational Health - Occupational Stress Questionnaire    Feeling of Stress : Not at all  Social Connections: Moderately Isolated (01/21/2023)   Social Connection and Isolation Panel [NHANES]    Frequency of Communication with Friends and Family: More than three times a week    Frequency of Social Gatherings with Friends and Family: Once a week    Attends Religious Services: Never    Database Administrator or Organizations: No    Attends Banker Meetings: Never    Marital Status: Married    Family History  Problem Relation Age of Onset   Depression Mother    Arthritis Mother    Anxiety disorder Mother    Diabetes Mother    Hypertension Mother  Diabetes Father    Alcohol abuse Father    Hypertension Father    Congenital Murmur Father    Heart attack Father        late 70's or early 62's   Hearing loss Brother        born deaf   Asthma Brother    Ovarian cancer Maternal Grandmother    Colon cancer Maternal Grandfather     Outpatient Encounter Medications as of 03/05/2023  Medication Sig   Accu-Chek Softclix Lancets lancets Use as instructed to check blood sugar 4 times daily   aspirin  EC 81 MG tablet Take 1 tablet (81 mg total) by mouth daily. Swallow whole.   Continuous Glucose Sensor (DEXCOM G7 SENSOR) MISC Inject 1 Application into the skin as directed. Change sensor every 10 days as directed.   Insulin  Disposable Pump (OMNIPOD DASH INTRO, GEN 4,) KIT Change pod every 72 hours   Insulin  Disposable Pump (OMNIPOD DASH PODS, GEN 4,) MISC Change pod every 72 hours   insulin  lispro (HUMALOG ) 100 UNIT/ML injection Use with Omnipod for TDD around 60 units daily.   Insulin  Syringe-Needle U-100 (INSULIN   SYRINGE .3CC/29GX1/2) 29G X 1/2 0.3 ML MISC 1 Units by Does not apply route 2 (two) times daily.   linaclotide  (LINZESS ) 145 MCG CAPS capsule Take 1 capsule (145 mcg total) by mouth daily before breakfast.   Prenatal Vit-Fe Fumarate-FA (PRENATAL MULTIVITAMIN) TABS tablet Take 1 tablet by mouth daily at 12 noon.   [DISCONTINUED] Insulin  Disposable Pump (OMNIPOD 5 DEXG7G6 PODS GEN 5) MISC Change pod every 72 hours   No facility-administered encounter medications on file as of 03/05/2023.    ALLERGIES: Allergies  Allergen Reactions   Metformin  And Related Diarrhea    Intestinal cramps   Prozac [Fluoxetine] Other (See Comments)    Reports suicidal thoughts with prozac or xanax--not willing to risk being on either medication   Xanax [Alprazolam] Other (See Comments)    Reports suicidal thoughts with prozac or xanax--not willing to risk being on either medication     VACCINATION STATUS: Immunization History  Administered Date(s) Administered   Influenza,inj,Quad PF,6+ Mos 11/13/2021   Influenza-Unspecified 12/05/2020   Moderna Sars-Covid-2 Vaccination 06/07/2019, 06/28/2019   PFIZER Comirnaty(Gray Top)Covid-19 Tri-Sucrose Vaccine 06/02/2019, 06/27/2019    Diabetes She presents for her follow-up diabetic visit. She has type 2 diabetes mellitus. Onset time: diagnosed at a approx age of 42. Her disease course has been improving. There are no hypoglycemic associated symptoms. There are no diabetic associated symptoms. There are no hypoglycemic complications. There are no diabetic complications. Risk factors for coronary artery disease include diabetes mellitus, family history, obesity and dyslipidemia. Current diabetic treatment includes insulin  pump (was previously on Ozempic  but stopped after positive pregnancy test). She is compliant with treatment most of the time. Her weight is fluctuating minimally. She is following a generally healthy diet. When asked about meal planning, she reported  none. She has not had a previous visit with a dietitian. She participates in exercise intermittently. Her home blood glucose trend is decreasing steadily. Her overall blood glucose range is 90-110 mg/dl. (She presents today with her Omnipod DASH and CGM showing at goal glycemic profile.  She was not due for another A1c today.  Analysis of her CGM shows TIR 95%, TAR 1%, TBR 4% with a GMI of 5.7%.  Her insurance denied for Omnipod 5 thus we switched to DASH.) An ACE inhibitor/angiotensin II receptor blocker is not being taken. She does not see a podiatrist.Eye exam  is current.     Review of systems  Constitutional: + stable body weight,  current Body mass index is 47.48 kg/m. , no fatigue, no subjective hyperthermia, no subjective hypothermia Eyes: no blurry vision, no xerophthalmia ENT: no sore throat, no nodules palpated in throat, no dysphagia/odynophagia, no hoarseness Cardiovascular: no chest pain, no shortness of breath, no palpitations, no leg swelling Respiratory: no cough, no shortness of breath Gastrointestinal: no nausea/vomiting/diarrhea Musculoskeletal: no muscle/joint aches Skin: no rashes, no hyperemia Neurological: no tremors, no numbness, no tingling, no dizziness Psychiatric: no depression, no anxiety  Objective:     BP 99/67 (BP Location: Left Arm, Patient Position: Sitting, Cuff Size: Large)   Pulse 76   Ht 5' 4 (1.626 m)   Wt 276 lb 9.6 oz (125.5 kg)   LMP 10/09/2022 (Exact Date) Comment: early ultrasound on 12/09/22    size < dates  EDD by early us  = 6-11---25  BMI 47.48 kg/m   Wt Readings from Last 3 Encounters:  03/05/23 276 lb 9.6 oz (125.5 kg)  03/05/23 276 lb (125.2 kg)  02/04/23 273 lb (123.8 kg)     BP Readings from Last 3 Encounters:  03/05/23 99/67  02/04/23 113/78  01/28/23 108/77       Physical Exam- Limited  Constitutional:  Body mass index is 47.48 kg/m. , not in acute distress, normal state of mind Eyes:  EOMI, no  exophthalmos Musculoskeletal: no gross deformities, strength intact in all four extremities, no gross restriction of joint movements Skin:  no rashes, no hyperemia Neurological: no tremor with outstretched hands   Diabetic Foot Exam - Simple   No data filed      CMP ( most recent) CMP     Component Value Date/Time   NA 140 08/25/2022 1505   K 4.5 08/25/2022 1505   CL 105 08/25/2022 1505   CO2 23 08/25/2022 1505   GLUCOSE 92 08/25/2022 1505   GLUCOSE 219 (H) 05/07/2021 0035   BUN 13 08/25/2022 1505   CREATININE 0.78 08/25/2022 1505   CALCIUM  8.9 08/25/2022 1505   PROT 7.2 08/25/2022 1505   ALBUMIN 4.2 08/25/2022 1505   AST 18 08/25/2022 1505   ALT 27 08/25/2022 1505   ALKPHOS 82 08/25/2022 1505   BILITOT <0.2 08/25/2022 1505   EGFR 107 08/25/2022 1505   GFRNONAA >60 12/22/2020 0951     Diabetic Labs (most recent): Lab Results  Component Value Date   HGBA1C 6.5 (A) 01/02/2023   HGBA1C 6.4 (H) 08/25/2022   HGBA1C 6.0 (H) 05/22/2022     Lipid Panel ( most recent) Lipid Panel     Component Value Date/Time   CHOL 158 02/20/2022 1050   TRIG 188 (H) 02/20/2022 1050   HDL 34 (L) 02/20/2022 1050   CHOLHDL 4.6 (H) 02/20/2022 1050   LDLCALC 92 02/20/2022 1050   LABVLDL 32 02/20/2022 1050      Lab Results  Component Value Date   TSH 0.724 01/21/2023           Assessment & Plan:   1) Type 2 diabetes mellitus without complication, without long-term current use of insulin  (HCC) 2) Pre-existing type 2 diabetes mellitus during pregnancy, antepartum  She presents today with her Omnipod DASH and CGM showing at goal glycemic profile.  She was not due for another A1c today.  Analysis of her CGM shows TIR 95%, TAR 1%, TBR 4% with a GMI of 5.7%.  Her insurance denied for Omnipod 5 thus we switched to DASH.  -  Allison Friedman has currently uncontrolled symptomatic type 2 DM since 28 years of age.   -Recent labs reviewed.  - I had a long discussion with her about the  progressive nature of diabetes and the pathology behind its complications. -her diabetes is not currently complicated but she remains at a high risk for more acute and chronic complications which include CAD, CVA, CKD, retinopathy, and neuropathy. These are all discussed in detail with her.  The following Lifestyle Medicine recommendations according to American College of Lifestyle Medicine [facility]) were discussed and offered to patient and she agrees to start the journey:  A. Whole Foods, Plant-based plate comprising of fruits and vegetables, plant-based proteins, whole-grain carbohydrates was discussed in detail with the patient.   A list for source of those nutrients were also provided to the patient.  Patient will use only water  or unsweetened tea for hydration. B.  The need to stay away from risky substances including alcohol, smoking; obtaining 7 to 9 hours of restorative sleep, at least 150 minutes of moderate intensity exercise weekly, the importance of healthy social connections,  and stress reduction techniques were discussed. C.  A full color page of  Calorie density of various food groups per pound showing examples of each food groups was provided to the patient.  - Nutritional counseling repeated at each appointment due to patients tendency to fall back in to old habits.  - The patient admits there is a room for improvement in their diet and drink choices. -  Suggestion is made for the patient to avoid simple carbohydrates from their diet including Cakes, Sweet Desserts / Pastries, Ice Cream, Soda (diet and regular), Sweet Tea, Candies, Chips, Cookies, Sweet Pastries, Store Bought Juices, Alcohol in Excess of 1-2 drinks a day, Artificial Sweeteners, Coffee Creamer, and Sugar-free Products. This will help patient to have stable blood glucose profile and potentially avoid unintended weight gain.   - I encouraged the patient to switch to unprocessed or minimally processed complex starch and  increased protein intake (animal or plant source), fruits, and vegetables.   - Patient is advised to stick to a routine mealtimes to eat 3 meals a day and avoid unnecessary snacks (to snack only to correct hypoglycemia).  - I have approached her with the following individualized plan to manage her diabetes and patient agrees:   -She is currently using her Omnipod DASH and CGM.  Insurance denied appeal for Omnipod 5. Given her stable profile with at goal readings, no changes will be made to her pump settings today.  -she is encouraged to continue monitoring glucose 4 times daily (using her CGM), before meals and before bed, and to call the clinic if her glucose is below 60 or above 180 for 3 tests in a row.  - she is warned not to take insulin  without proper monitoring per orders. - Adjustment parameters are given to her for hypo and hyperglycemia in writing.  -She is not a candidate for incretin therapy at this time due to pregnancy.  Insulin  is the safest treatment for her and the developing baby.  Once she has delivered the baby, she will be able to transition back to Ozempic  as she was on previously.  - Specific targets for  A1c; LDL, HDL, and Triglycerides were discussed with the patient.  3) Blood Pressure /Hypertension:  her blood pressure is controlled to target without the use of antihypertensive medications.    4) Lipids/Hyperlipidemia:    Review of her recent lipid panel from 02/20/22  showed controlled LDL at 92 and elevated triglycerides of 188.  She is not currently on any lipid lowering medications.   5)  Weight/Diet:  her Body mass index is 47.48 kg/m.  -  clearly complicating her diabetes care.   she is a candidate for weight loss. I discussed with her the fact that loss of 5 - 10% of her  current body weight will have the most impact on her diabetes management.  Exercise, and detailed carbohydrates information provided  -  detailed on discharge instructions.  6) Chronic  Care/Health Maintenance: -she is not on ACEI/ARB or Statin medications and is encouraged to initiate and continue to follow up with Ophthalmology, Dentist, Podiatrist at least yearly or according to recommendations, and advised to stay away from smoking. I have recommended yearly flu vaccine and pneumonia vaccine at least every 5 years; moderate intensity exercise for up to 150 minutes weekly; and sleep for at least 7 hours a day.  - she is advised to maintain close follow up with Joesph Annabella HERO, FNP for primary care needs, as well as her other providers for optimal and coordinated care.      I spent  36  minutes in the care of the patient today including review of labs from CMP, Lipids, Thyroid  Function, Hematology (current and previous including abstractions from other facilities); face-to-face time discussing  her blood glucose readings/logs, discussing hypoglycemia and hyperglycemia episodes and symptoms, medications doses, her options of short and long term treatment based on the latest standards of care / guidelines;  discussion about incorporating lifestyle medicine;  and documenting the encounter. Risk reduction counseling performed per USPSTF guidelines to reduce obesity and cardiovascular risk factors.     Please refer to Patient Instructions for Blood Glucose Monitoring and Insulin /Medications Dosing Guide  in media tab for additional information. Please  also refer to  Patient Self Inventory in the Media  tab for reviewed elements of pertinent patient history.  Allison Friedman participated in the discussions, expressed understanding, and voiced agreement with the above plans.  All questions were answered to her satisfaction. she is encouraged to contact clinic should she have any questions or concerns prior to her return visit.     Follow up plan: - Return in about 3 months (around 06/03/2023) for Diabetes F/U with A1c in office, No previsit labs, Bring meter and logs.   Benton Rio, [facility] [address] [place], [place] [address] Phone: [phone] Fax: [phone]  03/05/2023, 10:34 AM

## 2023-03-17 ENCOUNTER — Other Ambulatory Visit: Payer: Self-pay | Admitting: Obstetrics & Gynecology

## 2023-03-17 ENCOUNTER — Ambulatory Visit (INDEPENDENT_AMBULATORY_CARE_PROVIDER_SITE_OTHER): Payer: BC Managed Care – PPO | Admitting: Obstetrics & Gynecology

## 2023-03-17 VITALS — BP 118/74 | HR 87 | Wt 278.0 lb

## 2023-03-17 DIAGNOSIS — O24319 Unspecified pre-existing diabetes mellitus in pregnancy, unspecified trimester: Secondary | ICD-10-CM

## 2023-03-17 DIAGNOSIS — E1165 Type 2 diabetes mellitus with hyperglycemia: Secondary | ICD-10-CM

## 2023-03-17 DIAGNOSIS — O0992 Supervision of high risk pregnancy, unspecified, second trimester: Secondary | ICD-10-CM

## 2023-03-17 DIAGNOSIS — Z3A19 19 weeks gestation of pregnancy: Secondary | ICD-10-CM

## 2023-03-17 DIAGNOSIS — Z124 Encounter for screening for malignant neoplasm of cervix: Secondary | ICD-10-CM

## 2023-03-17 DIAGNOSIS — Z363 Encounter for antenatal screening for malformations: Secondary | ICD-10-CM

## 2023-03-17 DIAGNOSIS — O24312 Unspecified pre-existing diabetes mellitus in pregnancy, second trimester: Secondary | ICD-10-CM

## 2023-03-17 DIAGNOSIS — Z98891 History of uterine scar from previous surgery: Secondary | ICD-10-CM

## 2023-03-17 NOTE — Progress Notes (Signed)
HIGH-RISK PREGNANCY VISIT Patient name: Allison Friedman MRN [id number]  Date of birth: 07/27/1995 Chief Complaint:   Routine Prenatal Visit  History of Present Illness:   Allison Friedman is a 28 y.o. G71P1011 female at [redacted]w[redacted]d with an Estimated Date of Delivery: 08/05/23 being seen today for ongoing management of a high-risk pregnancy complicated by Class B DM on dexcom + omnipod.    Today she reports no complaints. Contractions: Not present. Vag. Bleeding: None.  Movement: Present. denies leaking of fluid.      01/21/2023   10:53 AM 12/03/2022    2:11 PM 08/01/2022    3:03 PM 06/13/2022    9:41 AM 05/22/2022   11:06 AM  Depression screen PHQ 2/9  Decreased Interest 0 0 0 0 0  Down, Depressed, Hopeless 0 1 0 1 1  PHQ - 2 Score 0 1 0 1 1  Altered sleeping 2 2 2 2  0  Tired, decreased energy 1 1 2 2 2   Change in appetite 1 1 2 2 2   Feeling bad or failure about yourself  0 0 0 0 0  Trouble concentrating 0 0 0 0 0  Moving slowly or fidgety/restless 0 0 0 0 0  Suicidal thoughts 0 0 0 0 0  PHQ-9 Score 4 5 6 7 5   Difficult doing work/chores  Not difficult at all Not difficult at all Not difficult at all Not difficult at all        01/21/2023   10:53 AM 12/03/2022    2:09 PM 08/01/2022    3:04 PM 06/13/2022    9:44 AM  GAD 7 : Generalized Anxiety Score  Nervous, Anxious, on Edge 0 2 1 2   Control/stop worrying 0 1 0 0  Worry too much - different things 0 0 1 2  Trouble relaxing 0 0 0 1  Restless 0 0 0 0  Easily annoyed or irritable 2 2 2 2   Afraid - awful might happen 0 0 0 0  Total GAD 7 Score 2 5 4 7   Anxiety Difficulty  Not difficult at all Not difficult at all Not difficult at all     Review of Systems:   Pertinent items are noted in HPI Denies abnormal vaginal discharge w/ itching/odor/irritation, headaches, visual changes, shortness of breath, chest pain, abdominal pain, severe nausea/vomiting, or problems with urination or bowel movements unless otherwise stated  above. Pertinent History Reviewed:  Reviewed past medical,surgical, social, obstetrical and family history.  Reviewed problem list, medications and allergies. Physical Assessment:   Vitals:   03/17/23 1153  BP: 118/74  Pulse: 87  Weight: 278 lb (126.1 kg)  Body mass index is 47.72 kg/m.           Physical Examination:   General appearance: alert, well appearing, and in no distress  Mental status: alert, oriented to person, place, and time  Skin: warm & dry   Extremities:      Cardiovascular: normal heart rate noted  Respiratory: normal respiratory effort, no distress  Abdomen: gravid, soft, non-tender  Pelvic: Cervical exam deferred         Fetal Status:     Movement: Present    Fetal Surveillance Testing today: FHR 144   Chaperone: N/A    No results found for this or any previous visit (from the past 24 hours).  Assessment & Plan:  High-risk pregnancy: G3P1011 at [redacted]w[redacted]d with an Estimated Date of Delivery: 08/05/23      ICD-10-CM   1. Supervision  of high risk pregnancy in second trimester  O09.92     2. 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   Orders: No orders of the defined types were placed in this encounter.    Labs/procedures today: none  Treatment Plan:  anatomy scan 2 weeks    Follow-up: Return in about 2 weeks (around 03/31/2023) for 20 week sono + HROB with Dr Despina Hidden.   Future Appointments  Date Time Provider Department Center  06/09/2023 10:30 AM Dani Gobble, NP [facility] None  06/09/2023 11:00 AM Mare Loan, RD [facility] None    No orders of the defined types were placed in this encounter.  Lazaro Arms  Attending Physician for the Center for [facility] 03/17/2023 12:36 PM

## 2023-03-19 ENCOUNTER — Encounter: Payer: BC Managed Care – PPO | Admitting: Obstetrics & Gynecology

## 2023-03-19 ENCOUNTER — Other Ambulatory Visit: Payer: BC Managed Care – PPO

## 2023-03-20 IMAGING — US US OB COMP +14 WK
1 series · 14 of 28 positions shown · non-contrast
Comparison: 09/15/2020

CLINICAL DATA: Vaginal bleeding

EXAM:
OBSTETRIC <14 WK ULTRASOUND
TECHNIQUE: Transabdominal ultrasound was performed for evaluation of the
gestation as well as the maternal uterus and adnexal regions.

[Series 1: us ob comp +14 wk · 14 of 62 slices shown]
[im 3/62]
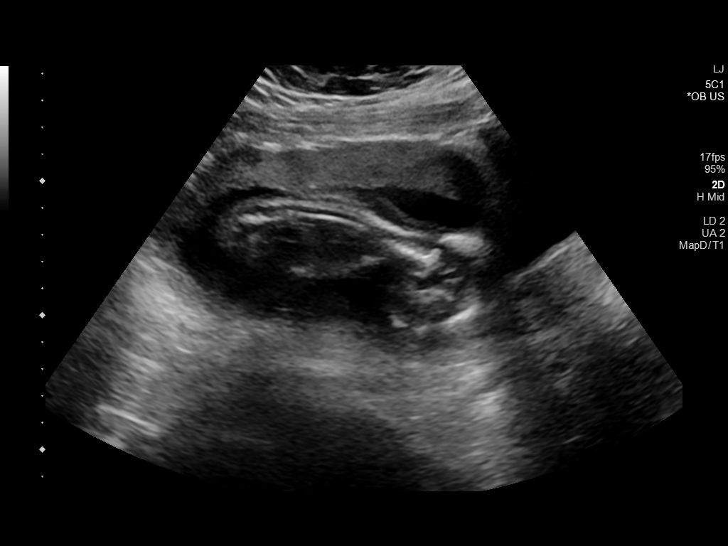
[im 7/62]
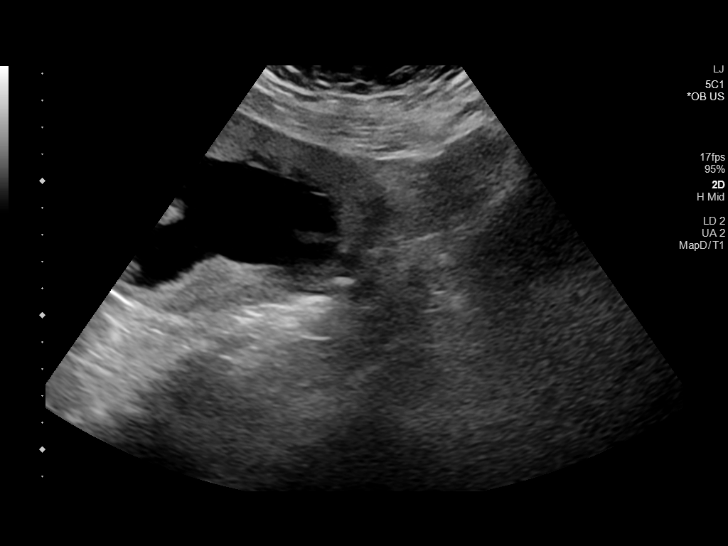
[im 12/62]
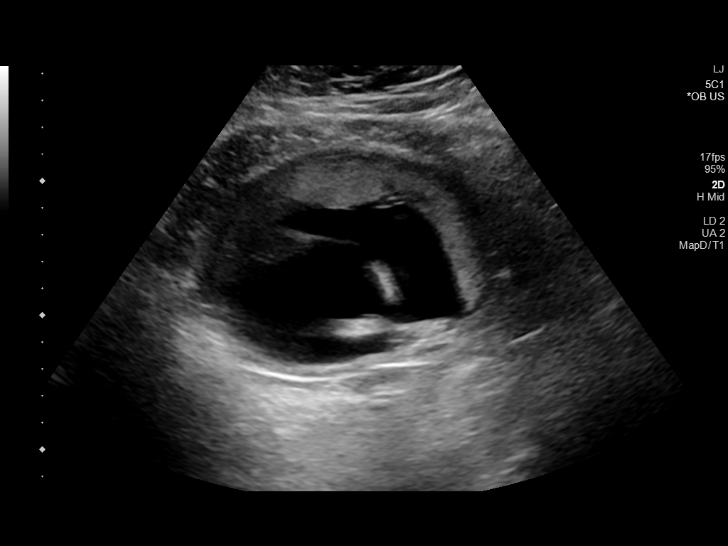
[im 16/62]
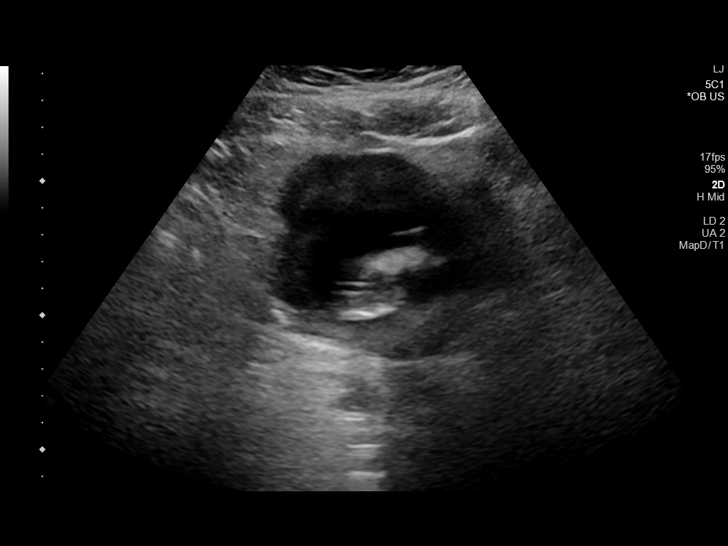
[im 21/62]
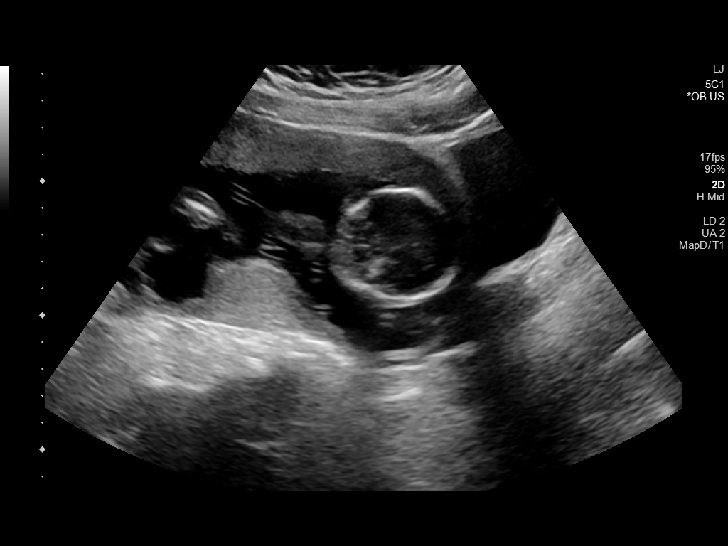
[im 25/62]
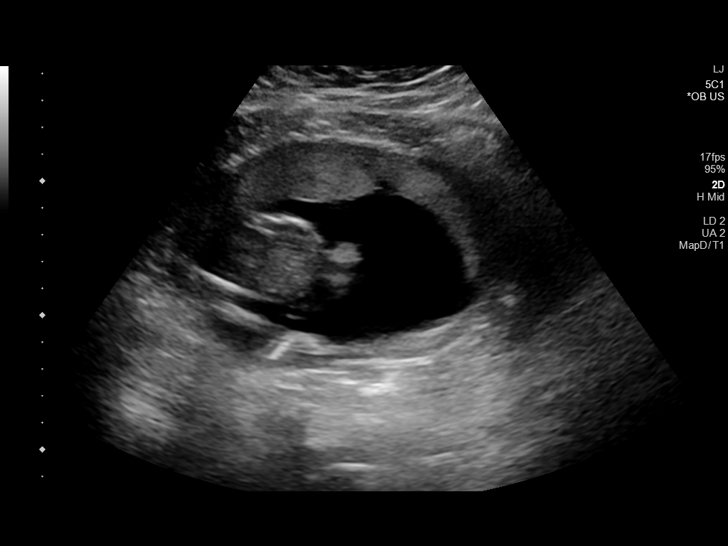
[im 30/62]
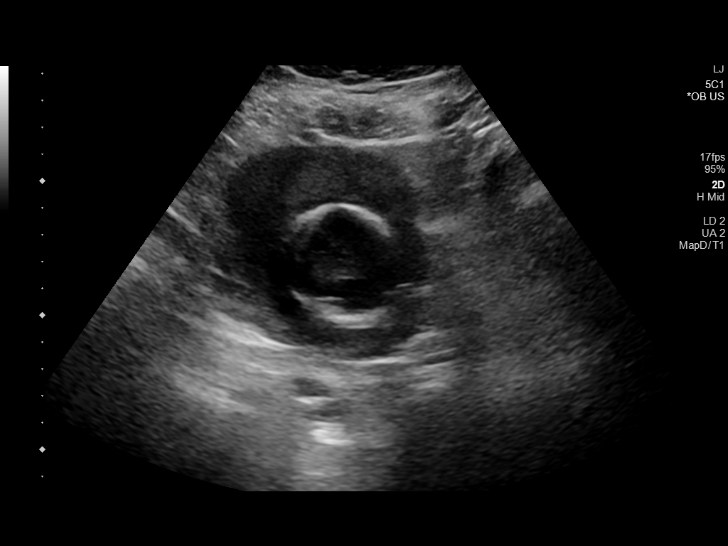
[im 34/62]
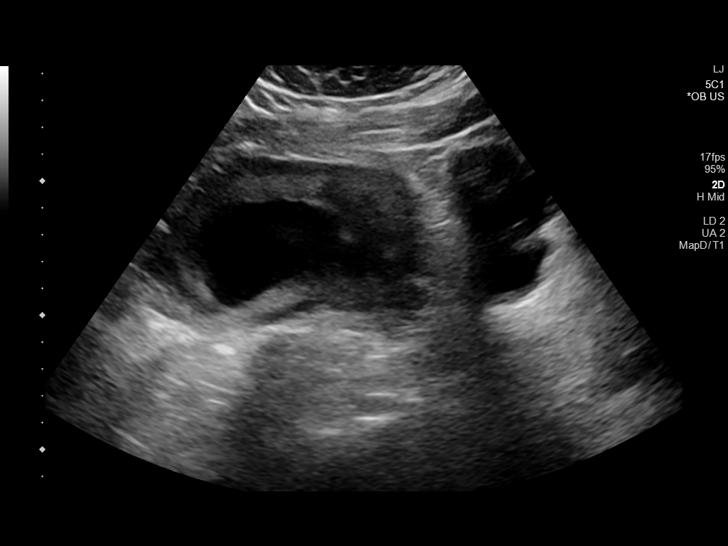
[im 39/62]
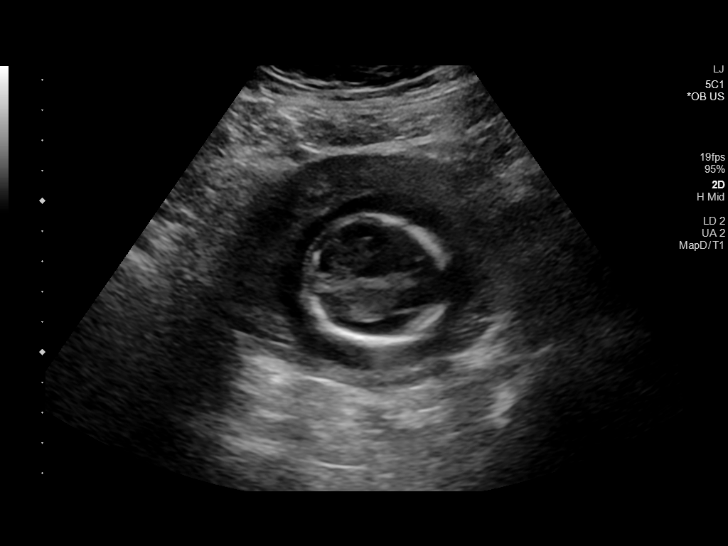
[im 43/62]
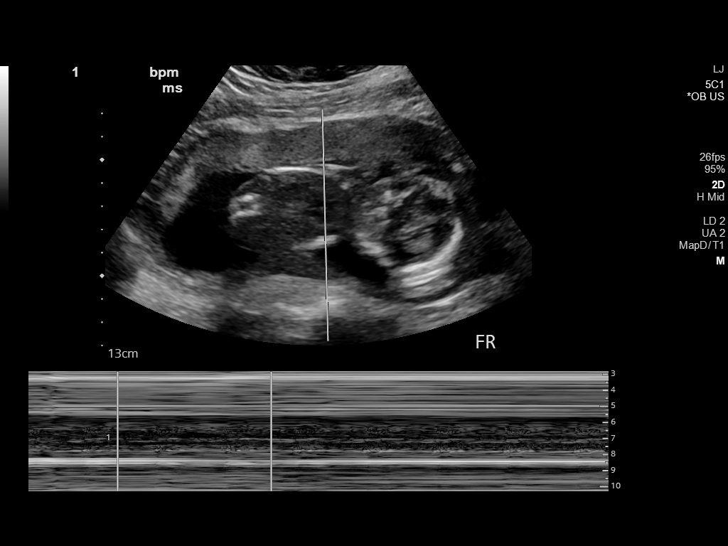
[im 48/62]
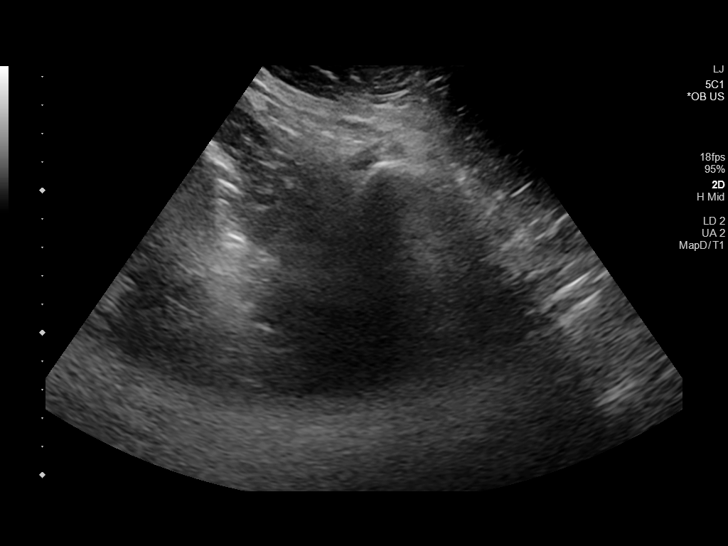
[im 52/62]
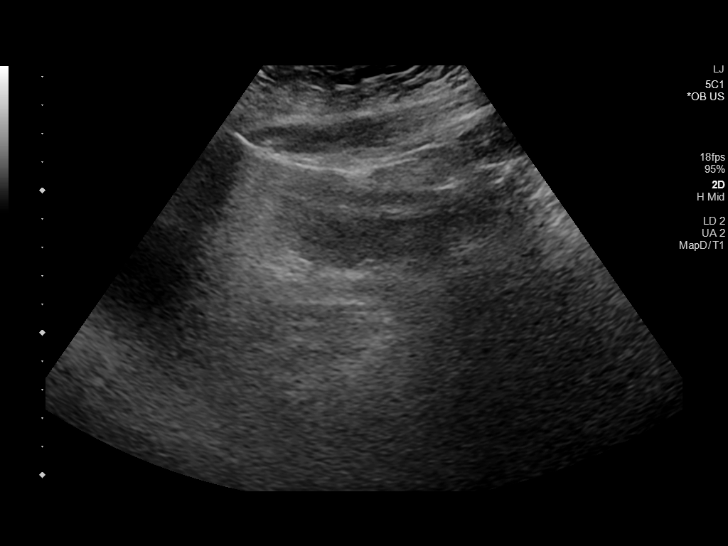
[im 57/62]
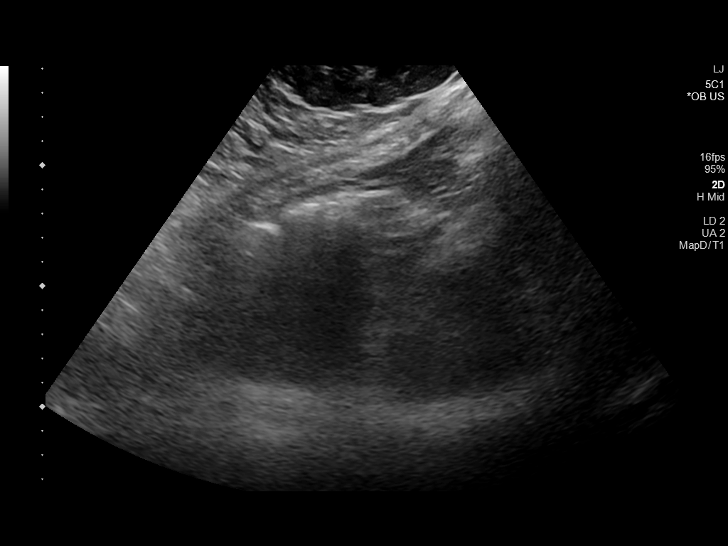
[im 62/62]
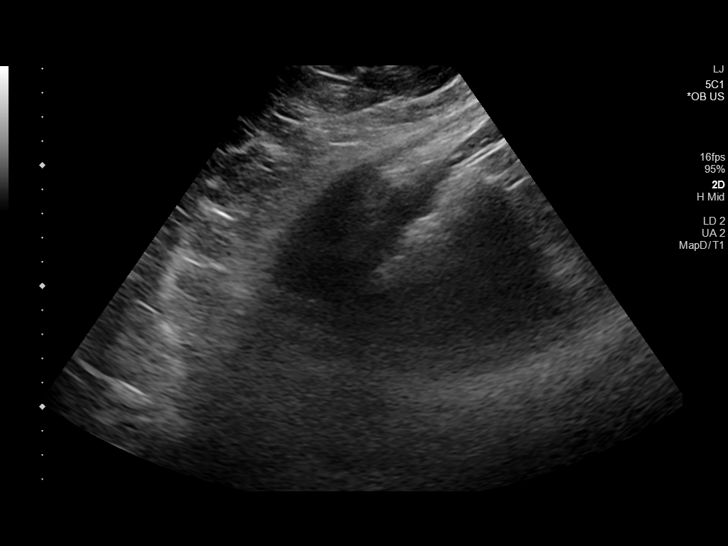

[14 of 28 positions shown; findings below may reference images not displayed]

FINDINGS: Single live intrauterine gestation is seen. Placenta is along the
anterior wall. Amount of fluid around the fetus is unremarkable.
Cervix is closed.

Heart Rate: 149 bpm

Biparietal diameter is 4.06 cm suggesting gestational age of 18
weeks 2 days. Estimated date of delivery is 05/23/2021.

Subchorionic hemorrhage:  None visualized.

Maternal uterus/adnexae: Uterus is unremarkable. Ovaries are not
adequately visualized. There are no adnexal masses. There is no free
fluid in the pelvis.
IMPRESSION: Single live intrauterine pregnancy seen. Sonographically estimated
gestational age is 18 weeks 2 days. There is no demonstrable
subchorionic bleed. Cervix is closed.

## 2023-03-23 NOTE — Telephone Encounter (Signed)
Per encounter from 12/26, rx was sent to [facility] pharmacy and they were able to provide the pt with the needed intro kit.

## 2023-03-25 ENCOUNTER — Ambulatory Visit: Payer: BC Managed Care – PPO | Admitting: Radiology

## 2023-03-25 ENCOUNTER — Encounter: Payer: BC Managed Care – PPO | Admitting: Women's Health

## 2023-03-25 DIAGNOSIS — Z363 Encounter for antenatal screening for malformations: Secondary | ICD-10-CM | POA: Diagnosis not present

## 2023-03-25 DIAGNOSIS — Z3A21 21 weeks gestation of pregnancy: Secondary | ICD-10-CM

## 2023-03-25 DIAGNOSIS — Z98891 History of uterine scar from previous surgery: Secondary | ICD-10-CM

## 2023-03-25 DIAGNOSIS — O99212 Obesity complicating pregnancy, second trimester: Secondary | ICD-10-CM | POA: Diagnosis not present

## 2023-03-25 DIAGNOSIS — O24312 Unspecified pre-existing diabetes mellitus in pregnancy, second trimester: Secondary | ICD-10-CM | POA: Diagnosis not present

## 2023-03-25 DIAGNOSIS — O24319 Unspecified pre-existing diabetes mellitus in pregnancy, unspecified trimester: Secondary | ICD-10-CM

## 2023-03-25 NOTE — Progress Notes (Signed)
[place]: GA = 21 weeks Single active female fetus, breech, FHR=141bpm, posterior pl high, gr1, central cord ins EFW 54% 405g  anatomy screen appears normal,  all cardiac structures documented except arches due to fetal position and High BMI.  Normal 4 ch view/3VV/3VTV/GV's crossing Nl amn fluid volume,  MVP = 3.9 cm  CL = 4.6 cm,  closed,  normal Left ov - Rt ov not seen

## 2023-03-27 ENCOUNTER — Encounter: Payer: BC Managed Care – PPO | Admitting: Obstetrics & Gynecology

## 2023-04-01 ENCOUNTER — Encounter: Payer: Self-pay | Admitting: Obstetrics & Gynecology

## 2023-04-01 ENCOUNTER — Ambulatory Visit (INDEPENDENT_AMBULATORY_CARE_PROVIDER_SITE_OTHER): Payer: BC Managed Care – PPO | Admitting: Obstetrics & Gynecology

## 2023-04-01 VITALS — BP 105/74 | HR 83 | Wt 279.2 lb

## 2023-04-01 DIAGNOSIS — Z98891 History of uterine scar from previous surgery: Secondary | ICD-10-CM

## 2023-04-01 DIAGNOSIS — O24312 Unspecified pre-existing diabetes mellitus in pregnancy, second trimester: Secondary | ICD-10-CM

## 2023-04-01 DIAGNOSIS — G43709 Chronic migraine without aura, not intractable, without status migrainosus: Secondary | ICD-10-CM

## 2023-04-01 DIAGNOSIS — Z3A22 22 weeks gestation of pregnancy: Secondary | ICD-10-CM

## 2023-04-01 DIAGNOSIS — O0992 Supervision of high risk pregnancy, unspecified, second trimester: Secondary | ICD-10-CM

## 2023-04-01 DIAGNOSIS — O24319 Unspecified pre-existing diabetes mellitus in pregnancy, unspecified trimester: Secondary | ICD-10-CM

## 2023-04-01 NOTE — Progress Notes (Signed)
 HIGH-RISK PREGNANCY VISIT Patient name: Allison Friedman MRN [id number]  Date of birth: 1995-10-27 Chief Complaint:   Routine Prenatal Visit  History of Present Illness:   Allison Friedman is a 28 y.o. G2P1011 female at [redacted]w[redacted]d with an Estimated Date of Delivery: 08/05/23 being seen today for ongoing management of a high-risk pregnancy complicated by:  -Class B DM-  -current highs- after eating ~ 160s after meals -lows have been 80-85s -next endocrine appt in April -pt using Dexcom/Omnipod- currently basal 0.8 x 24 + carb sens 1:7 + SS 1u/10>125   -prior C-section -Obesity  -Migraines without aura: Notes worsening headache.  Currently noting pain behind right eye. At times, pain causes change in her vision. Pain interrupting her sleep.  Tried tylenol  without improvement.  Normally when not pregnant take Excedrin migraine and reports having another medication from PCP but cannot remember the name.    Contractions: Not present. Vag. Bleeding: None.  Movement: Present. denies leaking of fluid.      01/21/2023   10:53 AM 12/03/2022    2:11 PM 08/01/2022    3:03 PM 06/13/2022    9:41 AM 05/22/2022   11:06 AM  Depression screen PHQ 2/9  Decreased Interest 0 0 0 0 0  Down, Depressed, Hopeless 0 1 0 1 1  PHQ - 2 Score 0 1 0 1 1  Altered sleeping 2 2 2 2  0  Tired, decreased energy 1 1 2 2 2   Change in appetite 1 1 2 2 2   Feeling bad or failure about yourself  0 0 0 0 0  Trouble concentrating 0 0 0 0 0  Moving slowly or fidgety/restless 0 0 0 0 0  Suicidal thoughts 0 0 0 0 0  PHQ-9 Score 4 5 6 7 5   Difficult doing work/chores  Not difficult at all Not difficult at all Not difficult at all Not difficult at all     Current Outpatient Medications  Medication Instructions   Accu-Chek Softclix Lancets lancets Use as instructed to check blood sugar 4 times daily   aspirin  EC 81 mg, Oral, Daily, Swallow whole.   Continuous Glucose Sensor (DEXCOM G7 SENSOR) MISC 1 Application, Subcutaneous, As  directed, Change sensor every 10 days as directed.   Insulin  Disposable Pump (OMNIPOD DASH INTRO, GEN 4,) KIT Change pod every 72 hours   Insulin  Disposable Pump (OMNIPOD DASH PODS, GEN 4,) MISC Change pod every 72 hours   insulin  lispro (HUMALOG ) 100 UNIT/ML injection Use with Omnipod for TDD around 60 units daily.   INSULIN  SYRINGE .3CC/29GX1/2 1 Units, Does not apply, 2 times daily   linaclotide  (LINZESS ) 145 mcg, Oral, Daily before breakfast   Prenatal Vit-Fe Fumarate-FA (PRENATAL MULTIVITAMIN) TABS tablet 1 tablet, Daily     Review of Systems:   Pertinent items are noted in HPI Denies abnormal vaginal discharge w/ itching/odor/irritation, shortness of breath, chest pain, abdominal pain, severe nausea/vomiting, or problems with urination or bowel movements unless otherwise stated above. Pertinent History Reviewed:  Reviewed past medical,surgical, social, obstetrical and family history.  Reviewed problem list, medications and allergies. Physical Assessment:   Vitals:   04/01/23 1149  BP: 105/74  Pulse: 83  Weight: 279 lb 3.2 oz (126.6 kg)  Body mass index is 47.92 kg/m.           Physical Examination:   General appearance: alert, well appearing, and in no distress  Mental status: normal mood, behavior, speech, dress, motor activity, and thought processes  Skin: warm & dry  Extremities:      Cardiovascular: normal heart rate noted  Respiratory: normal respiratory effort, no distress  Abdomen: gravid, soft, non-tender  Pelvic: Cervical exam deferred         Fetal Status: Fetal Heart Rate (bpm): 150   Movement: Present    Fetal Surveillance Testing today: doppler   Chaperone: N/A    No results found for this or any previous visit (from the past 24 hours).   Assessment & Plan:  High-risk pregnancy: G3P1011 at [redacted]w[redacted]d with an Estimated Date of Delivery: 08/05/23   1) Class B DM -endocrinology appt on Friday -growth q 4wks with plan for antepartum testing @ 32wks  2)  Migraines -ok for excedrin migraine -plan for preventive treatment with propranolol should the frequency of headaches continue  3) Prior C_section- desires repeat and sterilization  Meds: No orders of the defined types were placed in this encounter.   Labs/procedures today: none, reviewed anatomy scan 1/29- normal  Treatment Plan:  routine OB care and as outlined above  Reviewed: Preterm labor symptoms and general obstetric precautions including but not limited to vaginal bleeding, contractions, leaking of fluid and fetal movement were reviewed in detail with the patient.  All questions were answered. Pt has home bp cuff. Check bp weekly, let us  know if >140/90.   Follow-up: Return in about 3 weeks (around 04/22/2023) for 3-4 weeks HROB/growth scan and continue growth every 4.   Future Appointments  Date Time Provider Department Center  04/21/2023 11:30 AM [facility] - FT IMG 2 [facility] None  04/21/2023  1:30 PM Jayne Vonn DEL, MD [facility]  05/19/2023 10:45 AM [facility] - [facility]YN US  [facility] None  05/19/2023 11:50 AM Marilynn Nest, DO [facility]  06/09/2023 10:30 AM Therisa Benton PARAS, NP [facility] None  06/09/2023 11:00 AM Kristine Ronal BIRCH, RD [facility]-NDMR None  06/17/2023 10:45 AM [facility] - FTO[facility]YN US  [facility]G None  06/17/2023 11:50 AM Marilynn Nest, DO [facility]    No orders of the defined types were placed in this encounter.   Bearett Porcaro, DO Attending Obstetrician & Gynecologist, [facility] for [employer], [facility]

## 2023-04-03 ENCOUNTER — Encounter: Payer: Self-pay | Admitting: Nurse Practitioner

## 2023-04-03 ENCOUNTER — Ambulatory Visit: Payer: BC Managed Care – PPO | Admitting: Nurse Practitioner

## 2023-04-03 VITALS — BP 104/78 | HR 83 | Ht 64.0 in | Wt 280.0 lb

## 2023-04-03 DIAGNOSIS — E119 Type 2 diabetes mellitus without complications: Secondary | ICD-10-CM | POA: Diagnosis not present

## 2023-04-03 DIAGNOSIS — Z794 Long term (current) use of insulin: Secondary | ICD-10-CM

## 2023-04-03 DIAGNOSIS — O24119 Pre-existing diabetes mellitus, type 2, in pregnancy, unspecified trimester: Secondary | ICD-10-CM

## 2023-04-03 LAB — POCT GLYCOSYLATED HEMOGLOBIN (HGB A1C): Hemoglobin A1C: 6 % — AB (ref 4.0–5.6)

## 2023-04-03 NOTE — Progress Notes (Signed)
 Endocrinology Follow Up Note       04/03/2023, 11:31 AM   Subjective:    Patient ID: Allison Friedman, female    DOB: 1995-11-06.  Allison Friedman is being seen in follow up after being seen in consultation for management of currently uncontrolled symptomatic diabetes requested by  Joesph Annabella HERO, FNP.   Past Medical History:  Diagnosis Date   Anemia    Anxiety    Childhood asthma    Depression    Diabetes mellitus without complication (HCC)    Elevated lipase 10/25/2021   Migraines    Palpitations     Past Surgical History:  Procedure Laterality Date   CESAREAN SECTION N/A 05/08/2021   Procedure: CESAREAN SECTION;  Surgeon: Rendell Calton LABOR, DO;  Location: [facility] LD ORS;  Service: Obstetrics;  Laterality: N/A;   CYST REMOVAL NECK     infected sweat gland - cyst removal on the chest   NECK SURGERY      Social History   Socioeconomic History   Marital status: Married    Spouse name: Toribio   Number of children: 1   Years of education: 12   Highest education level: GED or equivalent  Occupational History   Not on file  Tobacco Use   Smoking status: Never   Smokeless tobacco: Never  Vaping Use   Vaping status: Never Used  Substance and Sexual Activity   Alcohol use: Never   Drug use: Never   Sexual activity: Yes  Other Topics Concern   Not on file  Social History Narrative   Not on file   Social Drivers of Health   Financial Resource Strain: Low Risk  (01/21/2023)   Overall Financial Resource Strain (CARDIA)    Difficulty of Paying Living Expenses: Not hard at all  Food Insecurity: No Food Insecurity (01/21/2023)   Hunger Vital Sign    Worried About Running Out of Food in the Last Year: Never true    Ran Out of Food in the Last Year: Never true  Transportation Needs: No Transportation Needs (01/21/2023)   PRAPARE - Administrator, Civil Service (Medical): No    Lack of  Transportation (Non-Medical): No  Physical Activity: Insufficiently Active (01/21/2023)   Exercise Vital Sign    Days of Exercise per Week: 2 days    Minutes of Exercise per Session: 30 min  Stress: No Stress Concern Present (01/21/2023)   [employer] of Occupational Health - Occupational Stress Questionnaire    Feeling of Stress : Not at all  Social Connections: Moderately Isolated (01/21/2023)   Social Connection and Isolation Panel [NHANES]    Frequency of Communication with Friends and Family: More than three times a week    Frequency of Social Gatherings with Friends and Family: Once a week    Attends Religious Services: Never    Database Administrator or Organizations: No    Attends Banker Meetings: Never    Marital Status: Married    Family History  Problem Relation Age of Onset   Depression Mother    Arthritis Mother    Anxiety disorder Mother    Diabetes Mother    Hypertension Mother  Diabetes Father    Alcohol abuse Father    Hypertension Father    Congenital Murmur Father    Heart attack Father        late 56's or early 36's   Hearing loss Brother        born deaf   Asthma Brother    Ovarian cancer Maternal Grandmother    Colon cancer Maternal Grandfather     Outpatient Encounter Medications as of 04/03/2023  Medication Sig   Accu-Chek Softclix Lancets lancets Use as instructed to check blood sugar 4 times daily   aspirin  EC 81 MG tablet Take 1 tablet (81 mg total) by mouth daily. Swallow whole.   Continuous Glucose Sensor (DEXCOM G7 SENSOR) MISC Inject 1 Application into the skin as directed. Change sensor every 10 days as directed.   Insulin  Disposable Pump (OMNIPOD DASH INTRO, GEN 4,) KIT Change pod every 72 hours   Insulin  Disposable Pump (OMNIPOD DASH PODS, GEN 4,) MISC Change pod every 72 hours   insulin  lispro (HUMALOG ) 100 UNIT/ML injection Use with Omnipod for TDD around 60 units daily.   Insulin  Syringe-Needle U-100 (INSULIN   SYRINGE .3CC/29GX1/2) 29G X 1/2 0.3 ML MISC 1 Units by Does not apply route 2 (two) times daily.   linaclotide  (LINZESS ) 145 MCG CAPS capsule Take 1 capsule (145 mcg total) by mouth daily before breakfast.   Prenatal Vit-Fe Fumarate-FA (PRENATAL MULTIVITAMIN) TABS tablet Take 1 tablet by mouth daily at 12 noon.   No facility-administered encounter medications on file as of 04/03/2023.    ALLERGIES: Allergies  Allergen Reactions   Metformin  And Related Diarrhea    Intestinal cramps   Prozac [Fluoxetine] Other (See Comments)    Reports suicidal thoughts with prozac or xanax--not willing to risk being on either medication   Xanax [Alprazolam] Other (See Comments)    Reports suicidal thoughts with prozac or xanax--not willing to risk being on either medication     VACCINATION STATUS: Immunization History  Administered Date(s) Administered   Influenza,inj,Quad PF,6+ Mos 11/13/2021   Influenza-Unspecified 12/05/2020   Moderna Sars-Covid-2 Vaccination 06/07/2019, 06/28/2019   PFIZER Comirnaty(Gray Top)Covid-19 Tri-Sucrose Vaccine 06/02/2019, 06/27/2019    Diabetes She presents for her follow-up diabetic visit. She has type 2 diabetes mellitus. Onset time: diagnosed at a approx age of 72. Her disease course has been improving. There are no hypoglycemic associated symptoms. There are no diabetic associated symptoms. There are no hypoglycemic complications. There are no diabetic complications. Risk factors for coronary artery disease include diabetes mellitus, family history, obesity and dyslipidemia. Current diabetic treatment includes insulin  pump (was previously on Ozempic  but stopped after positive pregnancy test). She is compliant with treatment most of the time. Her weight is fluctuating minimally. She is following a generally healthy diet. When asked about meal planning, she reported none. She has not had a previous visit with a dietitian. She participates in exercise intermittently. Her  home blood glucose trend is decreasing steadily. Her overall blood glucose range is 110-130 mg/dl. (She presents today with her Omnipod DASH and CGM showing at goal glycemic profile.  Her POCT A1c today is 6%, improving from last visit of 6.5%.  Analysis of her CGM shows TIR 94%, TAR 6%, TBR 0% with a GMI of 6.2%.  Her insurance denied for Omnipod 5 thus we switched to DASH.  Her OBGYN reached out with concerns of after meal glucose spiking over 160 at times.) An ACE inhibitor/angiotensin II receptor blocker is not being taken. She does not see a  podiatrist.Eye exam is current.     Review of systems  Constitutional: + increasing body weight (pregnant),  current Body mass index is 48.06 kg/m. , no fatigue, no subjective hyperthermia, no subjective hypothermia Eyes: no blurry vision, no xerophthalmia ENT: no sore throat, no nodules palpated in throat, no dysphagia/odynophagia, no hoarseness Cardiovascular: no chest pain, no shortness of breath, no palpitations, no leg swelling Respiratory: no cough, no shortness of breath Gastrointestinal: no nausea/vomiting/diarrhea Musculoskeletal: no muscle/joint aches Skin: no rashes, no hyperemia Neurological: no tremors, no numbness, no tingling, no dizziness Psychiatric: no depression, no anxiety  Objective:     BP 104/78 (BP Location: Right Arm, Patient Position: Sitting, Cuff Size: Large)   Pulse 83   Ht 5' 4 (1.626 m)   Wt 280 lb (127 kg)   LMP 10/09/2022 (Exact Date) Comment: early ultrasound on 12/09/22    size < dates  EDD by early us  = 6-11---25  BMI 48.06 kg/m   Wt Readings from Last 3 Encounters:  04/03/23 280 lb (127 kg)  04/01/23 279 lb 3.2 oz (126.6 kg)  03/17/23 278 lb (126.1 kg)     BP Readings from Last 3 Encounters:  04/03/23 104/78  04/01/23 105/74  03/17/23 118/74      Physical Exam- Limited  Constitutional:  Body mass index is 48.06 kg/m. , not in acute distress, normal state of mind Eyes:  EOMI, no  exophthalmos Musculoskeletal: no gross deformities, strength intact in all four extremities, no gross restriction of joint movements Skin:  no rashes, no hyperemia Neurological: no tremor with outstretched hands   Diabetic Foot Exam - Simple   No data filed      CMP ( most recent) CMP     Component Value Date/Time   NA 140 08/25/2022 1505   K 4.5 08/25/2022 1505   CL 105 08/25/2022 1505   CO2 23 08/25/2022 1505   GLUCOSE 92 08/25/2022 1505   GLUCOSE 219 (H) 05/07/2021 0035   BUN 13 08/25/2022 1505   CREATININE 0.78 08/25/2022 1505   CALCIUM  8.9 08/25/2022 1505   PROT 7.2 08/25/2022 1505   ALBUMIN 4.2 08/25/2022 1505   AST 18 08/25/2022 1505   ALT 27 08/25/2022 1505   ALKPHOS 82 08/25/2022 1505   BILITOT <0.2 08/25/2022 1505   EGFR 107 08/25/2022 1505   GFRNONAA >60 12/22/2020 0951     Diabetic Labs (most recent): Lab Results  Component Value Date   HGBA1C 6.0 (A) 04/03/2023   HGBA1C 6.5 (A) 01/02/2023   HGBA1C 6.4 (H) 08/25/2022     Lipid Panel ( most recent) Lipid Panel     Component Value Date/Time   CHOL 158 02/20/2022 1050   TRIG 188 (H) 02/20/2022 1050   HDL 34 (L) 02/20/2022 1050   CHOLHDL 4.6 (H) 02/20/2022 1050   LDLCALC 92 02/20/2022 1050   LABVLDL 32 02/20/2022 1050      Lab Results  Component Value Date   TSH 0.724 01/21/2023           Assessment & Plan:   1) Type 2 diabetes mellitus without complication, without long-term current use of insulin  (HCC) 2) Pre-existing type 2 diabetes mellitus during pregnancy, antepartum  She presents today with her Omnipod DASH and CGM showing at goal glycemic profile.  Her POCT A1c today is 6%, improving from last visit of 6.5%.  Analysis of her CGM shows TIR 94%, TAR 6%, TBR 0% with a GMI of 6.2%.  Her insurance denied for Omnipod 5 thus we  switched to DASH.  Her OBGYN reached out with concerns of after meal glucose spiking over 160 at times.  - Allison Friedman has currently uncontrolled symptomatic  type 2 DM since 28 years of age.   -Recent labs reviewed.  - I had a long discussion with her about the progressive nature of diabetes and the pathology behind its complications. -her diabetes is not currently complicated but she remains at a high risk for more acute and chronic complications which include CAD, CVA, CKD, retinopathy, and neuropathy. These are all discussed in detail with her.  The following Lifestyle Medicine recommendations according to American College of Lifestyle Medicine [facility]) were discussed and offered to patient and she agrees to start the journey:  A. Whole Foods, Plant-based plate comprising of fruits and vegetables, plant-based proteins, whole-grain carbohydrates was discussed in detail with the patient.   A list for source of those nutrients were also provided to the patient.  Patient will use only water  or unsweetened tea for hydration. B.  The need to stay away from risky substances including alcohol, smoking; obtaining 7 to 9 hours of restorative sleep, at least 150 minutes of moderate intensity exercise weekly, the importance of healthy social connections,  and stress reduction techniques were discussed. C.  A full color page of  Calorie density of various food groups per pound showing examples of each food groups was provided to the patient.  - Nutritional counseling repeated at each appointment due to patients tendency to fall back in to old habits.  - The patient admits there is a room for improvement in their diet and drink choices. -  Suggestion is made for the patient to avoid simple carbohydrates from their diet including Cakes, Sweet Desserts / Pastries, Ice Cream, Soda (diet and regular), Sweet Tea, Candies, Chips, Cookies, Sweet Pastries, Store Bought Juices, Alcohol in Excess of 1-2 drinks a day, Artificial Sweeteners, Coffee Creamer, and Sugar-free Products. This will help patient to have stable blood glucose profile and potentially avoid unintended  weight gain.   - I encouraged the patient to switch to unprocessed or minimally processed complex starch and increased protein intake (animal or plant source), fruits, and vegetables.   - Patient is advised to stick to a routine mealtimes to eat 3 meals a day and avoid unnecessary snacks (to snack only to correct hypoglycemia).  - I have approached her with the following individualized plan to manage her diabetes and patient agrees:   -She is currently using her Omnipod DASH and CGM.  Insurance denied appeal for Omnipod 5. I did adjust her basal rate to 1 unit per hour (from 0.8) and turned off reverse correction to help drive her average down a bit so her spikes don't reach 160 after meals.  -she is encouraged to continue monitoring glucose 4 times daily (using her CGM), before meals and before bed, and to call the clinic if her glucose is below 60 or above 180 for 3 tests in a row.  I encouraged her to reach out if glucose is off so we can adjust settings further if needed.  - she is warned not to take insulin  without proper monitoring per orders. - Adjustment parameters are given to her for hypo and hyperglycemia in writing.  -She is not a candidate for incretin therapy at this time due to pregnancy.  Insulin  is the safest treatment for her and the developing baby.  Once she has delivered the baby, she will be able to transition back to Ozempic   as she was on previously.  - Specific targets for  A1c; LDL, HDL, and Triglycerides were discussed with the patient.  3) Blood Pressure /Hypertension:  her blood pressure is controlled to target without the use of antihypertensive medications.    4) Lipids/Hyperlipidemia:    Review of her recent lipid panel from 02/20/22 showed controlled LDL at 92 and elevated triglycerides of 188.  She is not currently on any lipid lowering medications.   5)  Weight/Diet:  her Body mass index is 48.06 kg/m.  -  clearly complicating her diabetes care.   she is  a candidate for weight loss. I discussed with her the fact that loss of 5 - 10% of her  current body weight will have the most impact on her diabetes management.  Exercise, and detailed carbohydrates information provided  -  detailed on discharge instructions.  6) Chronic Care/Health Maintenance: -she is not on ACEI/ARB or Statin medications and is encouraged to initiate and continue to follow up with Ophthalmology, Dentist, Podiatrist at least yearly or according to recommendations, and advised to stay away from smoking. I have recommended yearly flu vaccine and pneumonia vaccine at least every 5 years; moderate intensity exercise for up to 150 minutes weekly; and sleep for at least 7 hours a day.  - she is advised to maintain close follow up with Joesph Annabella HERO, FNP for primary care needs, as well as her other providers for optimal and coordinated care.      I spent  38  minutes in the care of the patient today including review of labs from CMP, Lipids, Thyroid  Function, Hematology (current and previous including abstractions from other facilities); face-to-face time discussing  her blood glucose readings/logs, discussing hypoglycemia and hyperglycemia episodes and symptoms, medications doses, her options of short and long term treatment based on the latest standards of care / guidelines;  discussion about incorporating lifestyle medicine;  and documenting the encounter. Risk reduction counseling performed per USPSTF guidelines to reduce obesity and cardiovascular risk factors.     Please refer to Patient Instructions for Blood Glucose Monitoring and Insulin /Medications Dosing Guide  in media tab for additional information. Please  also refer to  Patient Self Inventory in the Media  tab for reviewed elements of pertinent patient history.  Allison Friedman participated in the discussions, expressed understanding, and voiced agreement with the above plans.  All questions were answered to her  satisfaction. she is encouraged to contact clinic should she have any questions or concerns prior to her return visit.     Follow up plan: - Return in about 3 months (around 07/01/2023) for Diabetes F/U with A1c in office, No previsit labs, Bring meter and logs.   Benton Rio, [facility] [address] [place], [place] [address] Phone: [phone] Fax: [phone]  04/03/2023, 11:31 AM

## 2023-04-14 ENCOUNTER — Encounter: Payer: BC Managed Care – PPO | Admitting: Obstetrics & Gynecology

## 2023-04-14 ENCOUNTER — Other Ambulatory Visit: Payer: BC Managed Care – PPO | Admitting: Radiology

## 2023-04-16 ENCOUNTER — Encounter: Payer: Self-pay | Admitting: Nurse Practitioner

## 2023-04-20 ENCOUNTER — Other Ambulatory Visit: Payer: Self-pay | Admitting: Obstetrics & Gynecology

## 2023-04-20 DIAGNOSIS — O24119 Pre-existing diabetes mellitus, type 2, in pregnancy, unspecified trimester: Secondary | ICD-10-CM

## 2023-04-21 ENCOUNTER — Ambulatory Visit: Payer: BC Managed Care – PPO | Admitting: Radiology

## 2023-04-21 ENCOUNTER — Ambulatory Visit: Payer: BC Managed Care – PPO | Admitting: Obstetrics & Gynecology

## 2023-04-21 VITALS — BP 115/68 | HR 95 | Wt 284.0 lb

## 2023-04-21 DIAGNOSIS — O24312 Unspecified pre-existing diabetes mellitus in pregnancy, second trimester: Secondary | ICD-10-CM

## 2023-04-21 DIAGNOSIS — O99212 Obesity complicating pregnancy, second trimester: Secondary | ICD-10-CM

## 2023-04-21 DIAGNOSIS — Z98891 History of uterine scar from previous surgery: Secondary | ICD-10-CM

## 2023-04-21 DIAGNOSIS — O24319 Unspecified pre-existing diabetes mellitus in pregnancy, unspecified trimester: Secondary | ICD-10-CM

## 2023-04-21 DIAGNOSIS — Z3A24 24 weeks gestation of pregnancy: Secondary | ICD-10-CM

## 2023-04-21 DIAGNOSIS — O0992 Supervision of high risk pregnancy, unspecified, second trimester: Secondary | ICD-10-CM

## 2023-04-21 DIAGNOSIS — O24119 Pre-existing diabetes mellitus, type 2, in pregnancy, unspecified trimester: Secondary | ICD-10-CM

## 2023-04-21 DIAGNOSIS — O99210 Obesity complicating pregnancy, unspecified trimester: Secondary | ICD-10-CM

## 2023-04-21 NOTE — Progress Notes (Signed)
 HIGH-RISK PREGNANCY VISIT Patient name: Allison Friedman MRN [id number]  Date of birth: 1995-05-01 Chief Complaint:   Routine Prenatal Visit  History of Present Illness:   Allison Friedman is a 28 y.o. G63P1011 female at 104w6d with an Estimated Date of Delivery: 08/05/23 being seen today for ongoing management of a high-risk pregnancy complicated by Class B DM on Pump + CGM with suboptimal control, making adjustments to settings .    Today she reports no complaints. Contractions: Not present. Vag. Bleeding: None.  Movement: Present. denies leaking of fluid.      01/21/2023   10:53 AM 12/03/2022    2:11 PM 08/01/2022    3:03 PM 06/13/2022    9:41 AM 05/22/2022   11:06 AM  Depression screen PHQ 2/9  Decreased Interest 0 0 0 0 0  Down, Depressed, Hopeless 0 1 0 1 1  PHQ - 2 Score 0 1 0 1 1  Altered sleeping 2 2 2 2  0  Tired, decreased energy 1 1 2 2 2   Change in appetite 1 1 2 2 2   Feeling bad or failure about yourself  0 0 0 0 0  Trouble concentrating 0 0 0 0 0  Moving slowly or fidgety/restless 0 0 0 0 0  Suicidal thoughts 0 0 0 0 0  PHQ-9 Score 4 5 6 7 5   Difficult doing work/chores  Not difficult at all Not difficult at all Not difficult at all Not difficult at all        01/21/2023   10:53 AM 12/03/2022    2:09 PM 08/01/2022    3:04 PM 06/13/2022    9:44 AM  GAD 7 : Generalized Anxiety Score  Nervous, Anxious, on Edge 0 2 1 2   Control/stop worrying 0 1 0 0  Worry too much - different things 0 0 1 2  Trouble relaxing 0 0 0 1  Restless 0 0 0 0  Easily annoyed or irritable 2 2 2 2   Afraid - awful might happen 0 0 0 0  Total GAD 7 Score 2 5 4 7   Anxiety Difficulty  Not difficult at all Not difficult at all Not difficult at all     Review of Systems:   Pertinent items are noted in HPI Denies abnormal vaginal discharge w/ itching/odor/irritation, headaches, visual changes, shortness of breath, chest pain, abdominal pain, severe nausea/vomiting, or problems with urination or bowel  movements unless otherwise stated above. Pertinent History Reviewed:  Reviewed past medical,surgical, social, obstetrical and family history.  Reviewed problem list, medications and allergies. Physical Assessment:   Vitals:   04/21/23 1204  BP: 115/68  Pulse: 95  Weight: 284 lb (128.8 kg)  Body mass index is 48.75 kg/m.           Physical Examination:   General appearance: alert, well appearing, and in no distress  Mental status: alert, oriented to person, place, and time  Skin: warm & dry   Extremities:      Cardiovascular: normal heart rate noted  Respiratory: normal respiratory effort, no distress  Abdomen: gravid, soft, non-tender  Pelvic: Cervical exam deferred         Fetal Status:     Movement: Present    Fetal Surveillance Testing today: [place] 55% normal fluid   Chaperone: N/A    No results found for this or any previous visit (from the past 24 hours).  Assessment & Plan:  High-risk pregnancy: G3P1011 at [redacted]w[redacted]d with an Estimated Date of Delivery: 08/05/23  ICD-10-CM   1. Supervision of high risk pregnancy in second trimester  O09.92     2. Modified White class B pregestational diabetes mellitus  O24.319     3. History of cesarean delivery  Z98.891          Meds: No orders of the defined types were placed in this encounter.   Orders: No orders of the defined types were placed in this encounter.    Labs/procedures today: U/S  Treatment Plan:  see below    Follow-up: No follow-ups on file.   Future Appointments  Date Time Provider Department Center  04/21/2023  1:30 PM Lazaro Arms, MD [facility]  05/19/2023 10:45 AM [facility]YN [place] [facility] None  05/19/2023 11:50 AM Myna Hidalgo, DO [facility]  06/09/2023 10:30 AM Dani Gobble, NP [facility]A None  06/09/2023 11:00 AM Mare Loan, RD NDM-NDMR None  06/16/2023 10:50 AM [facility]FTOBGYN NURSE [facility]  06/19/2023 10:45 AM [facility] IMG 2 [facility] None  06/19/2023 11:30 AM Myna Hidalgo, DO [facility]  06/23/2023 10:50 AM [facility]FT[facility]GY[facility] NURSE [facility]  06/26/2023 10:45 AM [facility] - FT IMG 2 [facility] None  06/26/2023 11:30 AM Lazaro Arms, MD [facility]  06/30/2023 10:50 AM [facility]FTOBGYN NURSE [facility]  07/03/2023 10:45 AM [facility] [place] [facility] None  07/03/2023 11:30 AM Lazaro Arms, MD [facility]  07/07/2023 10:50 AM [facility]FTOBGYN NURSE [facility]  07/10/2023 10:45 AM [facility] [place] [facility] None  07/10/2023 11:30 AM Lazaro Arms, MD [facility]  07/14/2023 10:50 AM [facility]FT[facility]GY[facility] NURSE [facility]  07/17/2023 10:45 AM [facility] [place] [facility] None  07/21/2023 10:50 AM [facility]FTOBGYN NURSE [facility]  07/24/2023 10:45 AM [facility]YN [place] [facility] None  07/28/2023 10:50 AM [facility]FT[facility]GYN NURSE [facility]  07/31/2023 10:45 AM [facility] [place] [facility] None  08/04/2023 10:50 AM [facility]FTOBGYN NURSE [facility]    No orders of the defined types were placed in this encounter.  Lazaro Arms  Attending Physician for the Center for [facility] 04/21/2023 12:34 PM

## 2023-04-21 NOTE — Progress Notes (Signed)
 [place]:  GA = 24+6 weeks Single active female fetus, cephalic, FHR = 147 bpm,  posterior pl, gr1 MVP = 5.6 cm  EFW 55%  783g    CL = 4.0 cm,  closed

## 2023-04-22 ENCOUNTER — Other Ambulatory Visit: Payer: Self-pay | Admitting: Obstetrics & Gynecology

## 2023-04-22 DIAGNOSIS — E1165 Type 2 diabetes mellitus with hyperglycemia: Secondary | ICD-10-CM

## 2023-04-22 DIAGNOSIS — O0992 Supervision of high risk pregnancy, unspecified, second trimester: Secondary | ICD-10-CM

## 2023-05-06 ENCOUNTER — Telehealth: Payer: Self-pay | Admitting: Nurse Practitioner

## 2023-05-06 MED ORDER — INSULIN LISPRO 100 UNIT/ML IJ SOLN: INTRAMUSCULAR | 3 refills | Status: DC

## 2023-05-06 NOTE — Telephone Encounter (Signed)
 Pt states that she needs new prescription of insulin sent in, pharmacy states she is using too much than what is prescribed on the bottle.  She said she doesn't have much left.

## 2023-05-06 NOTE — Telephone Encounter (Signed)
 Tammy will you call pt and explain this, also let her know Alphonzo Lemmings said she can come by so we can upload her readings to better adjust.

## 2023-05-06 NOTE — Telephone Encounter (Signed)
 I sent in with instructions to inject higher dose so that they can give her more quantity at a time.

## 2023-05-06 NOTE — Telephone Encounter (Signed)
 Patient was called and given Allison Friedman's recommendation. The patient states that her sensitivity is currently at a 7. I told the patient that I would share with Allison Friedman and call her back with more instruction.

## 2023-05-06 NOTE — Telephone Encounter (Signed)
 We may need to adjust her insulin sensitivity ratio from 43 down to 25 (this means for every 1 unit of insulin, it is expected to drop the glucose by 25 points) which means the pump will calculate a higher bolus prior to each meal and hopefully prevent her going up that high after eating.  She can always stop by for [place] to help make that change if she needs [place] to.

## 2023-05-06 NOTE — Telephone Encounter (Signed)
 I think she might be thinking about the insulin: carb ratio, not the insulin sensitivity (correction ratio).

## 2023-05-06 NOTE — Telephone Encounter (Signed)
 I called patient and share with her  the last addressed comment by the provider. I ask that the patient come by the office so that we could download and help her. Patient states that Thursday and Friday of this week, she has appointments and cannot come. She is wanting to know when would be another good time to come by. She is available to talk by phone.

## 2023-05-06 NOTE — Telephone Encounter (Signed)
 Pt states that sugars are spiking 280 after eating, obgyn changed insulin to 2 units per hour.  Waking up with sugars still in 120s.  Omnipods are only lasting 2 days instead of 3.  She said she feels like she is fighting her sugars which is requiring her to use more insulin.

## 2023-05-07 ENCOUNTER — Encounter: Payer: Self-pay | Admitting: Nurse Practitioner

## 2023-05-07 DIAGNOSIS — Z23 Encounter for immunization: Secondary | ICD-10-CM | POA: Diagnosis not present

## 2023-05-07 DIAGNOSIS — O24112 Pre-existing diabetes mellitus, type 2, in pregnancy, second trimester: Secondary | ICD-10-CM | POA: Diagnosis not present

## 2023-05-07 NOTE — Telephone Encounter (Signed)
 I sent her a message going through the steps to change the setting.  Hopefully she will be able to change it based on the step-by-step instructions.

## 2023-05-07 NOTE — Telephone Encounter (Signed)
 Noted.

## 2023-05-18 ENCOUNTER — Other Ambulatory Visit: Payer: Self-pay | Admitting: Obstetrics & Gynecology

## 2023-05-18 DIAGNOSIS — O24112 Pre-existing diabetes mellitus, type 2, in pregnancy, second trimester: Secondary | ICD-10-CM

## 2023-05-19 ENCOUNTER — Ambulatory Visit: Payer: BC Managed Care – PPO

## 2023-05-19 ENCOUNTER — Encounter: Payer: Self-pay | Admitting: Obstetrics & Gynecology

## 2023-05-19 ENCOUNTER — Ambulatory Visit: Payer: BC Managed Care – PPO | Admitting: Obstetrics & Gynecology

## 2023-05-19 VITALS — BP 120/74 | HR 81 | Wt 286.2 lb

## 2023-05-19 DIAGNOSIS — Z3A28 28 weeks gestation of pregnancy: Secondary | ICD-10-CM

## 2023-05-19 DIAGNOSIS — O24113 Pre-existing diabetes mellitus, type 2, in pregnancy, third trimester: Secondary | ICD-10-CM | POA: Diagnosis not present

## 2023-05-19 DIAGNOSIS — O24313 Unspecified pre-existing diabetes mellitus in pregnancy, third trimester: Secondary | ICD-10-CM | POA: Diagnosis not present

## 2023-05-19 DIAGNOSIS — O24112 Pre-existing diabetes mellitus, type 2, in pregnancy, second trimester: Secondary | ICD-10-CM

## 2023-05-19 DIAGNOSIS — O0992 Supervision of high risk pregnancy, unspecified, second trimester: Secondary | ICD-10-CM

## 2023-05-19 DIAGNOSIS — E1165 Type 2 diabetes mellitus with hyperglycemia: Secondary | ICD-10-CM | POA: Diagnosis not present

## 2023-05-19 DIAGNOSIS — O0993 Supervision of high risk pregnancy, unspecified, third trimester: Secondary | ICD-10-CM

## 2023-05-19 DIAGNOSIS — O24319 Unspecified pre-existing diabetes mellitus in pregnancy, unspecified trimester: Secondary | ICD-10-CM

## 2023-05-19 DIAGNOSIS — Z98891 History of uterine scar from previous surgery: Secondary | ICD-10-CM

## 2023-05-19 LAB — POCT URINALYSIS DIPSTICK OB
Blood, UA: NEGATIVE
Glucose, UA: NEGATIVE
Leukocytes, UA: NEGATIVE
Nitrite, UA: NEGATIVE

## 2023-05-19 MED ORDER — INSULIN LISPRO 100 UNIT/ML IJ SOLN: INTRAMUSCULAR | 3 refills | Status: DC

## 2023-05-19 NOTE — Progress Notes (Signed)
 [place] 28+5 wks,cephalic,FHR 146 bpm,posterior placenta gr 1,AFI 24 cm,normal ovaries,CX 3.6 cm,EFW 1520 g 82%,AC 92%

## 2023-05-19 NOTE — Progress Notes (Signed)
 HIGH-RISK PREGNANCY VISIT Patient name: Allison Friedman MRN [id number]  Date of birth: 1995-03-12 Chief Complaint:   Routine Prenatal Visit  History of Present Illness:   Allison Friedman is a 28 y.o. G27P1011 female at [redacted]w[redacted]d with an Estimated Date of Delivery: 08/05/23 being seen today for ongoing management of a high-risk pregnancy complicated by:  1) uncontrolled T2DM -pt being seen by endocrinology, has made some adjustments, but still noting elevated sugars.  States she is afraid to eat because her sugar is "too high"  Fasting this am 140 Typically after meals sugars have been 200-250  Basal rate 2u/hr--> increase 2.2u/hr 1:7 carb ratio currently > 4 carb ratio  She notes that yesterday she noted some mucus-like discharge when she voided.  She denies vaginal discharge since that time.  Denies vaginal itching or irritation.  Denies pelvic or abdominal pain.  She is having some pregnancy related issues including nerve pain in her right hip as well as discomfort from her Dexcom  Contractions: Not present. Vag. Bleeding: None.  Movement: Present. denies leaking of fluid.      01/21/2023   10:53 AM 12/03/2022    2:11 PM 08/01/2022    3:03 PM 06/13/2022    9:41 AM 05/22/2022   11:06 AM  Depression screen PHQ 2/9  Decreased Interest 0 0 0 0 0  Down, Depressed, Hopeless 0 1 0 1 1  PHQ - 2 Score 0 1 0 1 1  Altered sleeping 2 2 2 2  0  Tired, decreased energy 1 1 2 2 2   Change in appetite 1 1 2 2 2   Feeling bad or failure about yourself  0 0 0 0 0  Trouble concentrating 0 0 0 0 0  Moving slowly or fidgety/restless 0 0 0 0 0  Suicidal thoughts 0 0 0 0 0  PHQ-9 Score 4 5 6 7 5   Difficult doing work/chores  Not difficult at all Not difficult at all Not difficult at all Not difficult at all     Current Outpatient Medications  Medication Instructions   Accu-Chek Softclix Lancets lancets Use as instructed to check blood sugar 4 times daily   aspirin EC 81 mg, Oral, Daily, Swallow whole.    Continuous Glucose Sensor (DEXCOM G7 SENSOR) MISC 1 Application, Subcutaneous, As directed, Change sensor every 10 days as directed.   Insulin Disposable Pump (OMNIPOD DASH INTRO, GEN 4,) KIT Change pod every 72 hours   Insulin Disposable Pump (OMNIPOD DASH PODS, GEN 4,) MISC Change pod every 72 hours   insulin lispro (HUMALOG) 100 UNIT/ML injection Use with Omnipod for TDD around 80 units daily.   INSULIN SYRINGE .3CC/29GX1/2" 1 Units, Does not apply, 2 times daily   linaclotide (LINZESS) 145 mcg, Oral, Daily before breakfast   Prenatal Vit-Fe Fumarate-FA (PRENATAL MULTIVITAMIN) TABS tablet 1 tablet, Daily     Review of Systems:   Pertinent items are noted in HPI Denies abnormal vaginal discharge w/ itching/odor/irritation, headaches, visual changes, shortness of breath, chest pain, abdominal pain, severe nausea/vomiting, or problems with urination or bowel movements unless otherwise stated above. Pertinent History Reviewed:  Reviewed past medical,surgical, social, obstetrical and family history.  Reviewed problem list, medications and allergies. Physical Assessment:   Vitals:   05/19/23 1136  BP: 120/74  Pulse: 81  Weight: 286 lb 3.2 oz (129.8 kg)  Body mass index is 49.13 kg/m.           Physical Examination:   General appearance: alert, well appearing, and in no  distress  Mental status: normal mood, behavior, speech, dress, motor activity, and thought processes  Skin: warm & dry   Extremities:      Cardiovascular: normal heart rate noted  Respiratory: normal respiratory effort, no distress  Abdomen: gravid, soft, non-tender  Pelvic: Cervical exam deferred         Fetal Status:     Movement: Present    Fetal Surveillance Testing today: cephalic,FHR 146 bpm,posterior placenta gr 1,AFI 24 cm,normal ovaries,CX 3.6 cm,EFW 1520 g 82%,AC 92%    Chaperone: N/A    No results found for this or any previous visit (from the past 24 hours).   Assessment & Plan:  High-risk  pregnancy: G3P1011 at [redacted]w[redacted]d with an Estimated Date of Delivery: 08/05/23   1. [redacted] weeks gestation of pregnancy -routine OB care  2. Supervision of high risk pregnancy in third trimester (Primary)   3. Type 2 diabetes mellitus with hyperglycemia, without long-term current use of insulin (HCC) -Patient given sheet and to write down fasting and 2-hour postprandial sugars -Increase insulin: Basal rate 2u/hr--> increase 2.2u/hr 1:7 carb ratio currently > 1:4 carb ratio  -antepartum testing @ 32wks -continue growth q 4wks -delivery timing pending glucose management- likely to consider early IOL 37-38wks  4. Morbid obesity (HCC)   5. History of cesarean delivery -desires repeat with bilateral salpingectomy  Patient desires permanent sterilization.  Other reversible forms of contraception were discussed with patient; she declines all other modalities. Risks of procedure discussed with patient including but not limited to: risk of regret, permanence of method, bleeding, infection, and potential injury to surrounding organs.  Discussed that especially in setting of salpingectomy, risk of ectopic pregnancy low, less than 1%.     Meds: No orders of the defined types were placed in this encounter.   Labs/procedures today: BPP  Treatment Plan:  as outlined above  Reviewed: Preterm labor symptoms and general obstetric precautions including but not limited to vaginal bleeding, contractions, leaking of fluid and fetal movement were reviewed in detail with the patient.  All questions were answered. Pt has home bp cuff. Check bp weekly, let us know if >140/90.   Follow-up: No follow-ups on file.   Future Appointments  Date Time Provider Department Center  05/19/2023 11:50 AM Myna Hidalgo, DO [facility]  06/15/2023  9:30 AM Dani Gobble, NP REA-REA None  06/15/2023 10:00 AM Mare Loan, RD NDM-NDMR None  06/16/2023 10:50 AM [facility]FTOBGYN NURSE [facility]  06/19/2023 10:45 AM [facility]  - FT IMG 2 [facility]FTIMG None  06/19/2023 11:30 AM Myna Hidalgo, DO [facility]  06/23/2023 10:50 AM [facility]FTOBGYN NURSE [facility]  06/26/2023 10:45 AM [facility] - FT IMG 2 [facility]FT[facility]G None  06/26/2023 11:30 AM Lazaro Arms, MD [facility]  06/30/2023 10:50 AM [facility]FTOBGYN NURSE [facility]  07/03/2023 10:45 AM [facility] - F[facility]BGYN [place] [facility] None  07/03/2023 11:30 AM Lazaro Arms, MD [facility]  07/07/2023 10:50 AM [facility]FT[facility]GYN NURSE [facility]  07/10/2023 10:45 AM [facility] - [facility]YN [place] [facility] None  07/10/2023 11:30 AM Myna Hidalgo, DO [facility]  07/14/2023 10:50 AM [facility]FT[facility]GYN NURSE [facility]  07/17/2023 10:45 AM [facility] - [facility]YN [place] [facility] None  07/17/2023 11:30 AM Lazaro Arms, MD [facility]  07/21/2023 10:50 AM [facility]FT[facility]GYN NURSE [facility]  07/24/2023 10:45 AM [facility] - [facility]YN [place] [facility] None  07/24/2023 11:30 AM Lazaro Arms, MD [facility]  07/28/2023 10:50 AM [facility]FT[facility]GYN NURSE [facility]  07/31/2023 10:45 AM [facility] - [facility]YN [place] [facility]  None  08/04/2023 10:50 AM [facility]FTOBGYN NURSE [facility]    Orders Placed This Encounter  Procedures   POC Urinalysis Dipstick OB    Myna Hidalgo, DO Attending Obstetrician & Gynecologist, Faculty Practice Center for [employer], [facility]

## 2023-05-20 ENCOUNTER — Encounter: Payer: Self-pay | Admitting: Obstetrics & Gynecology

## 2023-05-20 LAB — RPR: RPR Ser Ql: NONREACTIVE

## 2023-05-20 LAB — COMPREHENSIVE METABOLIC PANEL
ALT: 6 IU/L (ref 0–32)
AST: 7 IU/L (ref 0–40)
Albumin: 3.9 g/dL — ABNORMAL LOW (ref 4.0–5.0)
Alkaline Phosphatase: 70 IU/L (ref 44–121)
BUN/Creatinine Ratio: 17 (ref 9–23)
BUN: 8 mg/dL (ref 6–20)
Bilirubin Total: <0.2 mg/dL (ref 0.0–1.2)
CO2: 18 mmol/L — ABNORMAL LOW (ref 20–29)
Calcium: 9 mg/dL (ref 8.7–10.2)
Chloride: 105 mmol/L (ref 96–106)
Creatinine, Ser: 0.46 mg/dL — ABNORMAL LOW (ref 0.57–1.00)
Globulin, Total: 2.8 g/dL (ref 1.5–4.5)
Glucose: 118 mg/dL — ABNORMAL HIGH (ref 70–99)
Potassium: 4.4 mmol/L (ref 3.5–5.2)
Sodium: 137 mmol/L (ref 134–144)
Total Protein: 6.7 g/dL (ref 6.0–8.5)
eGFR: 134 mL/min/{1.73_m2} (ref >59)

## 2023-05-20 LAB — CBC
Hematocrit: 38.3 % (ref 34.0–46.6)
Hemoglobin: 12.5 g/dL (ref 11.1–15.9)
MCH: 28.3 pg (ref 26.6–33.0)
MCHC: 32.6 g/dL (ref 31.5–35.7)
MCV: 87 fL (ref 79–97)
Platelets: 189 10*3/uL (ref 150–450)
RBC: 4.42 x10E6/uL (ref 3.77–5.28)
RDW: 13.2 % (ref 11.7–15.4)
WBC: 8.2 10*3/uL (ref 3.4–10.8)

## 2023-05-20 LAB — HIV ANTIBODY (ROUTINE TESTING W REFLEX): HIV Screen 4th Generation wRfx: NONREACTIVE

## 2023-05-30 ENCOUNTER — Inpatient Hospital Stay (HOSPITAL_COMMUNITY)
Admission: AD | Admit: 2023-05-30 | Discharge: 2023-05-31 | Disposition: A | Discharge status: Home / Self Care | Attending: Obstetrics & Gynecology | Admitting: Obstetrics & Gynecology

## 2023-05-30 DIAGNOSIS — Z79899 Other long term (current) drug therapy: Secondary | ICD-10-CM | POA: Diagnosis not present

## 2023-05-30 DIAGNOSIS — Z9641 Presence of insulin pump (external) (internal): Secondary | ICD-10-CM | POA: Insufficient documentation

## 2023-05-30 DIAGNOSIS — O99891 Other specified diseases and conditions complicating pregnancy: Secondary | ICD-10-CM | POA: Insufficient documentation

## 2023-05-30 DIAGNOSIS — O26893 Other specified pregnancy related conditions, third trimester: Secondary | ICD-10-CM | POA: Diagnosis not present

## 2023-05-30 DIAGNOSIS — R6883 Chills (without fever): Secondary | ICD-10-CM | POA: Insufficient documentation

## 2023-05-30 DIAGNOSIS — R42 Dizziness and giddiness: Secondary | ICD-10-CM | POA: Diagnosis not present

## 2023-05-30 DIAGNOSIS — O24113 Pre-existing diabetes mellitus, type 2, in pregnancy, third trimester: Secondary | ICD-10-CM | POA: Insufficient documentation

## 2023-05-30 DIAGNOSIS — O99613 Diseases of the digestive system complicating pregnancy, third trimester: Secondary | ICD-10-CM | POA: Insufficient documentation

## 2023-05-30 DIAGNOSIS — R110 Nausea: Secondary | ICD-10-CM | POA: Diagnosis not present

## 2023-05-30 DIAGNOSIS — Z794 Long term (current) use of insulin: Secondary | ICD-10-CM | POA: Diagnosis not present

## 2023-05-30 DIAGNOSIS — I493 Ventricular premature depolarization: Secondary | ICD-10-CM | POA: Insufficient documentation

## 2023-05-30 DIAGNOSIS — R0602 Shortness of breath: Secondary | ICD-10-CM | POA: Insufficient documentation

## 2023-05-30 DIAGNOSIS — Z3689 Encounter for other specified antenatal screening: Secondary | ICD-10-CM

## 2023-05-30 DIAGNOSIS — K5900 Constipation, unspecified: Secondary | ICD-10-CM | POA: Insufficient documentation

## 2023-05-30 DIAGNOSIS — Z3A30 30 weeks gestation of pregnancy: Secondary | ICD-10-CM | POA: Diagnosis not present

## 2023-05-30 DIAGNOSIS — R197 Diarrhea, unspecified: Secondary | ICD-10-CM | POA: Diagnosis not present

## 2023-05-30 NOTE — MAU Note (Signed)
..  Allison Friedman is a 28 y.o. at [redacted]w[redacted]d here in MAU reporting:  Hot and cold flashes and lightheaded started at 6pm  Diarrhea started Wednesday, 2-3 episodes a day Out of breath with walking Having "bladder pain" feels like she has to pee but can't  More fetal movement than normal to her.  Sugars have been "all over the place" 140/280 Is on an omnipod currently takes 2.2 units an hour.  Dexacom shows BS is 84  Pain score: 3-4/10 Vitals:   05/30/23 2347  BP: 117/68  Pulse: 85  Resp: 20  Temp: 97.8 F (36.6 C)  SpO2: 100%     FHT: 145 Lab orders placed from triage:  UA

## 2023-05-30 NOTE — MAU Provider Note (Signed)
 History     CSN: [id number]  Arrival date and time: 05/30/23 2334   Event Date/Time   First Provider Initiated Contact with Patient 05/30/23 2347      No chief complaint on file.  Allison Friedman is a 28 y.o. G3P1011 at [redacted]w[redacted]d who receives care at [facility].  She presents today for lightheadedness, "feeling hot and cold" and diarrhea.  Patient states her symptoms started at 6pm after waking from a nap with the exception of the diarrhea.  She states she takes Linzess for constipation and has had loose "soft serve" stools.  She states prior to these bowel movements she has immense abdominal pain that resolves after.  She reports her blood glucose has been "all over the place" and was 165 at 6pm, but 84 upon arrival.  She reports she has eaten only cantaloupe and a salami sandwich today. Patient also reports some SoB with walking or standing and states she feels like she "can't catch [her] breathe." She also reports some pressure with urination.  Patient endorses fetal movement and denies vaginal bleeding, discharge, or leaking.  No contractions.   OB History     Gravida  3   Para  1   Term  1   Preterm      AB  1   Living  1      SAB  1   IAB      Ectopic      Multiple      Live Births  1           Past Medical History:  Diagnosis Date   Anemia    Anxiety    Childhood asthma    Depression    Diabetes mellitus without complication (HCC)    Elevated lipase 10/25/2021   Migraines    Palpitations     Past Surgical History:  Procedure Laterality Date   CESAREAN SECTION N/A 05/08/2021   Procedure: CESAREAN SECTION;  Surgeon: Toy Baker, DO;  Location: [facility] LD ORS;  Service: Obstetrics;  Laterality: N/A;   CYST REMOVAL NECK     infected sweat gland - cyst removal on the chest   NECK SURGERY      Family History  Problem Relation Age of Onset   Depression Mother    Arthritis Mother    Anxiety disorder Mother    Diabetes Mother    Hypertension Mother     Diabetes Father    Alcohol abuse Father    Hypertension Father    Congenital Murmur Father    Heart attack Father        late 79's or early 77's   Hearing loss Brother        born deaf   Asthma Brother    Ovarian cancer Maternal Grandmother    Colon cancer Maternal Grandfather     Social History   Tobacco Use   Smoking status: Never   Smokeless tobacco: Never  Vaping Use   Vaping status: Never Used  Substance Use Topics   Alcohol use: Never   Drug use: Never    Allergies:  Allergies  Allergen Reactions   Metformin And Related Diarrhea    Intestinal cramps   Prozac [Fluoxetine] Other (See Comments)    Reports suicidal thoughts with prozac or xanax--not willing to risk being on either medication   Xanax [Alprazolam] Other (See Comments)    Reports suicidal thoughts with prozac or xanax--not willing to risk being on either medication     Medications  Prior to Admission  Medication Sig Dispense Refill Last Dose/Taking   Accu-Chek Softclix Lancets lancets Use as instructed to check blood sugar 4 times daily 100 each 12    aspirin EC 81 MG tablet Take 1 tablet (81 mg total) by mouth daily. Swallow whole. 30 tablet 12    Continuous Glucose Sensor (DEXCOM G7 SENSOR) MISC Inject 1 Application into the skin as directed. Change sensor every 10 days as directed. 3 each 11    Insulin Disposable Pump (OMNIPOD DASH INTRO, GEN 4,) KIT Change pod every 72 hours 1 kit 0    Insulin Disposable Pump (OMNIPOD DASH PODS, GEN 4,) MISC Change pod every 72 hours 10 each 6    insulin lispro (HUMALOG) 100 UNIT/ML injection Use with Omnipod for TDD around 100 units daily. 40 mL 3    Insulin Syringe-Needle U-100 (INSULIN SYRINGE .3CC/29GX1/2") 29G X 1/2" 0.3 ML MISC 1 Units by Does not apply route 2 (two) times daily. 200 each 6    linaclotide (LINZESS) 145 MCG CAPS capsule Take 1 capsule (145 mcg total) by mouth daily before breakfast. 30 capsule 5    Prenatal Vit-Fe Fumarate-FA (PRENATAL  MULTIVITAMIN) TABS tablet Take 1 tablet by mouth daily at 12 noon.       Review of Systems  Constitutional:  Positive for chills.  Gastrointestinal:  Positive for abdominal pain (Cramping prior to bowel movements), constipation, diarrhea and nausea. Negative for vomiting.  Genitourinary:  Positive for dysuria (Pressure). Negative for difficulty urinating, vaginal bleeding and vaginal discharge.  Neurological:  Positive for light-headedness. Negative for dizziness and headaches.   Physical Exam   Last menstrual period 10/09/2022.  Physical Exam Vitals reviewed. Exam conducted with a chaperone present Darlina Rumpf, RN).  Constitutional:      General: She is not in acute distress.    Appearance: Normal appearance. She is obese. She is not ill-appearing.  HENT:     Head: Normocephalic and atraumatic.  Eyes:     Conjunctiva/sclera: Conjunctivae normal.  Cardiovascular:     Rate and Rhythm: Normal rate.  Pulmonary:     Effort: Pulmonary effort is normal. No respiratory distress.  Musculoskeletal:        General: Normal range of motion.     Cervical back: Normal range of motion.  Skin:    General: Skin is warm and dry.  Neurological:     Mental Status: She is alert and oriented to person, place, and time.  Psychiatric:        Mood and Affect: Mood normal.        Behavior: Behavior normal.     Fetal Assessment 135 bpm, Mod Var, -Decels, +Accels Toco: No ctx graphed  MAU Course   Results for orders placed or performed during the hospital encounter of 05/30/23 (from the past 24 hours)  Urinalysis, Routine w reflex microscopic -Urine, Clean Catch     Status: Abnormal   Collection Time: 05/30/23 11:57 PM  Result Value Ref Range   Color, Urine YELLOW YELLOW   APPearance HAZY (A) CLEAR   Specific Gravity, Urine 1.027 1.005 - 1.030   pH 5.0 5.0 - 8.0   Glucose, UA NEGATIVE NEGATIVE mg/dL   Hgb urine dipstick NEGATIVE NEGATIVE   Bilirubin Urine NEGATIVE NEGATIVE   Ketones, ur 5  (A) NEGATIVE mg/dL   Protein, ur NEGATIVE NEGATIVE mg/dL   Nitrite NEGATIVE NEGATIVE   Leukocytes,Ua NEGATIVE NEGATIVE  Glucose, capillary     Status: None   Collection Time: 05/31/23 12:24 AM  Result Value Ref Range   Glucose-Capillary 82 70 - 99 mg/dL  Comprehensive metabolic panel     Status: Abnormal   Collection Time: 05/31/23 12:28 AM  Result Value Ref Range   Sodium 134 (L) 135 - 145 mmol/L   Potassium 3.9 3.5 - 5.1 mmol/L   Chloride 107 98 - 111 mmol/L   CO2 18 (L) 22 - 32 mmol/L   Glucose, Bld 95 70 - 99 mg/dL   BUN 10 6 - 20 mg/dL   Creatinine, Ser 7.82 0.44 - 1.00 mg/dL   Calcium 8.7 (L) 8.9 - 10.3 mg/dL   Total Protein 6.5 6.5 - 8.1 g/dL   Albumin 2.9 (L) 3.5 - 5.0 g/dL   AST 11 (L) 15 - 41 U/L   ALT 9 0 - 44 U/L   Alkaline Phosphatase 50 38 - 126 U/L   Total Bilirubin 0.4 0.0 - 1.2 mg/dL   GFR, Estimated >95 >62 mL/min   Anion gap 9 5 - 15  CBC     Status: None   Collection Time: 05/31/23 12:28 AM  Result Value Ref Range   WBC 8.6 4.0 - 10.5 K/uL   RBC 4.21 3.87 - 5.11 MIL/uL   Hemoglobin 12.0 12.0 - 15.0 g/dL   HCT 13.0 86.5 - 78.4 %   MCV 86.7 80.0 - 100.0 fL   MCH 28.5 26.0 - 34.0 pg   MCHC 32.9 30.0 - 36.0 g/dL   RDW 69.6 29.5 - 28.4 %   Platelets 183 150 - 400 K/uL   nRBC 0.0 0.0 - 0.2 %   No results found.  MDM PE Labs: CBC, CMP, UA, CBG EFM EKG Consult Assessment and Plan  28 year old G3P1011  SIUP at 30.3 weeks Cat I FT Lightheadedness SOB Chills  -POC Reviewed. -Exam performed. -Labs ordered. -Repeat CBG to confirm accuracy of Dexcom. -EKG ordered. -NST Reactive. -Monitor and await results.    Cherre Robins MSN, CNM 05/30/2023, 11:47 PM   Reassessment (1:00 AM) -Dr. Regino Schultze paged and returns call. -Stating results appear to be a normal EKG. No ischemic findings.   Reassessment (1:39 AM) -Results as above. -Provider to bedside to discuss. -Discussed proper nutritional intake to avoid extreme variations in blood glucose  levels which can be cause of symptoms. -Patient reports nausea with eating and discussed usage of antiemetic.  Patient agreeable and rx sent to pharmacy on file.  -Instructed to keep next appt as scheduled.  -Encouraged to call primary office or return to MAU if symptoms worsen or with the onset of new symptoms. -Discharged to home in stable condition.  Cherre Robins MSN, CNM Advanced Practice Provider, Center for [employer]

## 2023-05-30 NOTE — MAU Provider Note (Incomplete)
 History     CSN: [id number]  Arrival date and time: 05/30/23 2334   Event Date/Time   First Provider Initiated Contact with Patient 05/30/23 2347      No chief complaint on file.  Allison Friedman is a 28 y.o. G***P*** at [redacted]w[redacted]d who receives care at ***.  She presents today for ***  {GYN/OB [id number]  Past Medical History:  Diagnosis Date  . Anemia   . Anxiety   . Childhood asthma   . Depression   . Diabetes mellitus without complication (HCC)   . Elevated lipase 10/25/2021  . Migraines   . Palpitations     Past Surgical History:  Procedure Laterality Date  . CESAREAN SECTION N/A 05/08/2021   Procedure: CESAREAN SECTION;  Surgeon: Clance Boll A, DO;  Location: [facility];  Service: Obstetrics;  Laterality: N/A;  . CYST REMOVAL NECK     infected sweat gland - cyst removal on the chest  . NECK SURGERY      Family History  Problem Relation Age of Onset  . Depression Mother   . Arthritis Mother   . Anxiety disorder Mother   . Diabetes Mother   . Hypertension Mother   . Diabetes Father   . Alcohol abuse Father   . Hypertension Father   . Congenital Murmur Father   . Heart attack Father        late 51's or early 75's  . Hearing loss Brother        born deaf  . Asthma Brother   . Ovarian cancer Maternal Grandmother   . Colon cancer Maternal Grandfather     Social History   Tobacco Use  . Smoking status: Never  . Smokeless tobacco: Never  Vaping Use  . Vaping status: Never Used  Substance Use Topics  . Alcohol use: Never  . Drug use: Never    Allergies:  Allergies  Allergen Reactions  . Metformin And Related Diarrhea    Intestinal cramps  . Prozac [Fluoxetine] Other (See Comments)    Reports suicidal thoughts with prozac or xanax--not willing to risk being on either medication  . Xanax [Alprazolam] Other (See Comments)    Reports suicidal thoughts with prozac or xanax--not willing to risk being on either medication     Medications Prior to  Admission  Medication Sig Dispense Refill Last Dose/Taking  . Accu-Chek Softclix Lancets lancets Use as instructed to check blood sugar 4 times daily 100 each 12   . aspirin EC 81 MG tablet Take 1 tablet (81 mg total) by mouth daily. Swallow whole. 30 tablet 12   . Continuous Glucose Sensor (DEXCOM G7 SENSOR) MISC Inject 1 Application into the skin as directed. Change sensor every 10 days as directed. 3 each 11   . Insulin Disposable Pump (OMNIPOD DASH INTRO, GEN 4,) KIT Change pod every 72 hours 1 kit 0   . Insulin Disposable Pump (OMNIPOD DASH PODS, GEN 4,) MISC Change pod every 72 hours 10 each 6   . insulin lispro (HUMALOG) 100 UNIT/ML injection Use with Omnipod for TDD around 100 units daily. 40 mL 3   . Insulin Syringe-Needle U-100 (INSULIN SYRINGE .3CC/29GX1/2") 29G X 1/2" 0.3 ML MISC 1 Units by Does not apply route 2 (two) times daily. 200 each 6   . linaclotide (LINZESS) 145 MCG CAPS capsule Take 1 capsule (145 mcg total) by mouth daily before breakfast. 30 capsule 5   . Prenatal Vit-Fe Fumarate-FA (PRENATAL MULTIVITAMIN) TABS tablet Take 1 tablet by  mouth daily at 12 noon.       Review of Systems Physical Exam   Last menstrual period 10/09/2022.  Physical Exam  Fetal Assessment *** bpm, Mod Var, -Decels, +Accels Toco:   MAU Course  No results found for this or any previous visit (from the past 24 hours). No results found.  MDM PE Labs: EFM  Assessment and Plan  *** G***P***  SIUP at ***weeks Cat *** FT   -Exam findings discussed. Cherre Robins MSN, CNM 05/30/2023, 11:47 PM

## 2023-05-31 ENCOUNTER — Encounter (HOSPITAL_COMMUNITY): Payer: Self-pay | Admitting: Obstetrics & Gynecology

## 2023-05-31 DIAGNOSIS — K5900 Constipation, unspecified: Secondary | ICD-10-CM | POA: Diagnosis not present

## 2023-05-31 DIAGNOSIS — R197 Diarrhea, unspecified: Secondary | ICD-10-CM | POA: Diagnosis not present

## 2023-05-31 DIAGNOSIS — O99613 Diseases of the digestive system complicating pregnancy, third trimester: Secondary | ICD-10-CM | POA: Diagnosis not present

## 2023-05-31 DIAGNOSIS — R110 Nausea: Secondary | ICD-10-CM | POA: Diagnosis not present

## 2023-05-31 LAB — COMPREHENSIVE METABOLIC PANEL WITH GFR
ALT: 9 U/L (ref 0–44)
AST: 11 U/L — ABNORMAL LOW (ref 15–41)
Albumin: 2.9 g/dL — ABNORMAL LOW (ref 3.5–5.0)
Alkaline Phosphatase: 50 U/L (ref 38–126)
Anion gap: 9 (ref 5–15)
BUN: 10 mg/dL (ref 6–20)
CO2: 18 mmol/L — ABNORMAL LOW (ref 22–32)
Calcium: 8.7 mg/dL — ABNORMAL LOW (ref 8.9–10.3)
Chloride: 107 mmol/L (ref 98–111)
Creatinine, Ser: 0.53 mg/dL (ref 0.44–1.00)
GFR, Estimated: >60 mL/min (ref >60)
Glucose, Bld: 95 mg/dL (ref 70–99)
Potassium: 3.9 mmol/L (ref 3.5–5.1)
Sodium: 134 mmol/L — ABNORMAL LOW (ref 135–145)
Total Bilirubin: 0.4 mg/dL (ref 0.0–1.2)
Total Protein: 6.5 g/dL (ref 6.5–8.1)

## 2023-05-31 LAB — CBC
HCT: 36.5 % (ref 36.0–46.0)
Hemoglobin: 12 g/dL (ref 12.0–15.0)
MCH: 28.5 pg (ref 26.0–34.0)
MCHC: 32.9 g/dL (ref 30.0–36.0)
MCV: 86.7 fL (ref 80.0–100.0)
Platelets: 183 10*3/uL (ref 150–400)
RBC: 4.21 MIL/uL (ref 3.87–5.11)
RDW: 13.9 % (ref 11.5–15.5)
WBC: 8.6 10*3/uL (ref 4.0–10.5)
nRBC: 0 % (ref 0.0–0.2)

## 2023-05-31 LAB — URINALYSIS, ROUTINE W REFLEX MICROSCOPIC
Bilirubin Urine: NEGATIVE
Glucose, UA: NEGATIVE mg/dL
Hgb urine dipstick: NEGATIVE
Ketones, ur: 5 mg/dL — AB
Leukocytes,Ua: NEGATIVE
Nitrite: NEGATIVE
Protein, ur: NEGATIVE mg/dL
Specific Gravity, Urine: 1.027 (ref 1.005–1.030)
pH: 5 (ref 5.0–8.0)

## 2023-05-31 LAB — GLUCOSE, CAPILLARY: Glucose-Capillary: 82 mg/dL (ref 70–99)

## 2023-05-31 MED ORDER — ONDANSETRON 4 MG PO TBDP: 4.0000 mg | ORAL_TABLET | Freq: Four times a day (QID) | ORAL | 0 refills | Status: DC | PRN

## 2023-06-02 ENCOUNTER — Encounter: Payer: Self-pay | Admitting: Obstetrics & Gynecology

## 2023-06-02 ENCOUNTER — Ambulatory Visit: Admitting: Obstetrics & Gynecology

## 2023-06-02 VITALS — BP 106/74 | HR 102 | Wt 292.0 lb

## 2023-06-02 DIAGNOSIS — O24319 Unspecified pre-existing diabetes mellitus in pregnancy, unspecified trimester: Secondary | ICD-10-CM | POA: Diagnosis not present

## 2023-06-02 DIAGNOSIS — Z3A30 30 weeks gestation of pregnancy: Secondary | ICD-10-CM | POA: Diagnosis not present

## 2023-06-02 DIAGNOSIS — O0993 Supervision of high risk pregnancy, unspecified, third trimester: Secondary | ICD-10-CM | POA: Diagnosis not present

## 2023-06-02 NOTE — Progress Notes (Signed)
 HIGH-RISK PREGNANCY VISIT Patient name: Allison Friedman MRN [id number]  Date of birth: 1996/01/04 Chief Complaint:   Routine Prenatal Visit  History of Present Illness:   Allison Friedman is a 28 y.o. G45P1011 female at [redacted]w[redacted]d with an Estimated Date of Delivery: 08/05/23 being seen today for ongoing management of a high-risk pregnancy complicated by Class B DM(type 2) on 2.2 u/hr + 1:4, she has only been doing 1:7.    Today she reports no complaints. Contractions: Not present. Vag. Bleeding: None.  Movement: Present. denies leaking of fluid.      01/21/2023   10:53 AM 12/03/2022    2:11 PM 08/01/2022    3:03 PM 06/13/2022    9:41 AM 05/22/2022   11:06 AM  Depression screen PHQ 2/9  Decreased Interest 0 0 0 0 0  Down, Depressed, Hopeless 0 1 0 1 1  PHQ - 2 Score 0 1 0 1 1  Altered sleeping 2 2 2 2  0  Tired, decreased energy 1 1 2 2 2   Change in appetite 1 1 2 2 2   Feeling bad or failure about yourself  0 0 0 0 0  Trouble concentrating 0 0 0 0 0  Moving slowly or fidgety/restless 0 0 0 0 0  Suicidal thoughts 0 0 0 0 0  PHQ-9 Score 4 5 6 7 5   Difficult doing work/chores  Not difficult at all Not difficult at all Not difficult at all Not difficult at all        01/21/2023   10:53 AM 12/03/2022    2:09 PM 08/01/2022    3:04 PM 06/13/2022    9:44 AM  GAD 7 : Generalized Anxiety Score  Nervous, Anxious, on Edge 0 2 1 2   Control/stop worrying 0 1 0 0  Worry too much - different things 0 0 1 2  Trouble relaxing 0 0 0 1  Restless 0 0 0 0  Easily annoyed or irritable 2 2 2 2   Afraid - awful might happen 0 0 0 0  Total GAD 7 Score 2 5 4 7   Anxiety Difficulty  Not difficult at all Not difficult at all Not difficult at all     Review of Systems:   Pertinent items are noted in HPI Denies abnormal vaginal discharge w/ itching/odor/irritation, headaches, visual changes, shortness of breath, chest pain, abdominal pain, severe nausea/vomiting, or problems with urination or bowel movements  unless otherwise stated above. Pertinent History Reviewed:  Reviewed past medical,surgical, social, obstetrical and family history.  Reviewed problem list, medications and allergies. Physical Assessment:   Vitals:   06/02/23 1106  BP: 106/74  Pulse: (!) 102  Weight: 292 lb (132.5 kg)  Body mass index is 50.12 kg/m.           Physical Examination:   General appearance: alert, well appearing, and in no distress  Mental status: alert, oriented to person, place, and time  Skin: warm & dry   Extremities:      Cardiovascular: normal heart rate noted  Respiratory: normal respiratory effort, no distress  Abdomen: gravid, soft, non-tender  Pelvic: Cervical exam deferred         Fetal Status: Fetal Heart Rate (bpm): 144 Fundal Height: 35 cm Movement: Present    Fetal Surveillance Testing today: FHR  144   Chaperone:     No results found for this or any previous visit (from the past 24 hours).  Assessment & Plan:  High-risk pregnancy: G3P1011 at [redacted]w[redacted]d with an  Estimated Date of Delivery: 08/05/23      ICD-10-CM   1. Supervision of high risk pregnancy in third trimester  O09.93     2. Class B DM^ basal 2.2u/hr, ^1:4 carb ratio  O24.319        Meds: No orders of the defined types were placed in this encounter.   Orders: No orders of the defined types were placed in this encounter.    Labs/procedures today: none  Treatment Plan:  keep scheduled    Follow-up: Return for keep scheduled.   Future Appointments  Date Time Provider Department Center  06/11/2023  8:30 AM [facility] - FTOBGYN [place] [facility] None  06/11/2023  9:30 AM Myna Hidalgo, DO [facility] FTOBGYN  06/15/2023  9:30 AM Dani Gobble, NP [facility] None  06/15/2023 10:00 AM Mare Loan, RD [facility]-NDMR None  06/16/2023 10:50 AM [facility] NURSE [facility]TOBGYN  06/19/2023 10:45 AM [facility] - FT IMG 2 [facility] None  06/19/2023 11:30 AM Myna Hidalgo, DO [facility]TO[facility]  06/23/2023 10:50 AM [facility]GY[facility] NURSE [facility]TO[facility]   06/26/2023 10:45 AM [facility] - FT IMG 2 [facility] None  06/26/2023 11:30 AM Lazaro Arms, MD [facility] FTOBGYN  06/30/2023 10:50 AM [facility]GY[facility] NURSE [facility]  07/03/2023 10:45 AM [facility] - [facility]YN [place] [facility] None  07/03/2023 11:30 AM Lazaro Arms, MD [facility]TO[facility]  07/07/2023 10:50 AM [facility]GYN NURSE [facility]TOBGYN  07/10/2023 10:45 AM [facility] - [facility]YN [place] [facility] None  07/10/2023 11:30 AM Myna Hidalgo, DO [facility] FTOBGYN  07/14/2023 10:50 AM [facility]GY[facility] NURSE [facility] FTOBGYN  07/17/2023 10:45 AM [facility] - [facility]YN [place] [facility] None  07/17/2023 11:30 AM Lazaro Arms, MD [facility] FTOBGYN  07/21/2023 10:50 AM [facility]OBGYN NURSE [facility] FTOBGYN  07/24/2023 10:45 AM [facility] - FTOBGYN [place] [facility] None  07/24/2023 11:30 AM Lazaro Arms, MD [facility] FTOBGYN  07/28/2023 10:50 AM [facility]FT[facility]GYN NURSE [facility] FTOBGYN  07/31/2023 10:45 AM [facility] - [facility]TOBGYN [place] [facility] None  08/04/2023 10:50 AM [facility] NURSE [facility] FTOBGYN    No orders of the defined types were placed in this encounter.  Lazaro Arms  Attending Physician for the Center for [facility] 06/02/2023 11:57 AM

## 2023-06-09 ENCOUNTER — Ambulatory Visit: Payer: BC Managed Care – PPO | Admitting: Nutrition

## 2023-06-09 ENCOUNTER — Ambulatory Visit: Payer: BC Managed Care – PPO | Admitting: Nurse Practitioner

## 2023-06-10 ENCOUNTER — Other Ambulatory Visit: Payer: Self-pay | Admitting: Obstetrics & Gynecology

## 2023-06-10 DIAGNOSIS — O24113 Pre-existing diabetes mellitus, type 2, in pregnancy, third trimester: Secondary | ICD-10-CM

## 2023-06-10 DIAGNOSIS — O99213 Obesity complicating pregnancy, third trimester: Secondary | ICD-10-CM

## 2023-06-11 ENCOUNTER — Ambulatory Visit: Admitting: Obstetrics & Gynecology

## 2023-06-11 ENCOUNTER — Ambulatory Visit

## 2023-06-11 VITALS — BP 109/74 | HR 76 | Wt 291.6 lb

## 2023-06-11 DIAGNOSIS — Z3A32 32 weeks gestation of pregnancy: Secondary | ICD-10-CM

## 2023-06-11 DIAGNOSIS — O24113 Pre-existing diabetes mellitus, type 2, in pregnancy, third trimester: Secondary | ICD-10-CM | POA: Diagnosis not present

## 2023-06-11 DIAGNOSIS — L299 Pruritus, unspecified: Secondary | ICD-10-CM

## 2023-06-11 DIAGNOSIS — O24319 Unspecified pre-existing diabetes mellitus in pregnancy, unspecified trimester: Secondary | ICD-10-CM

## 2023-06-11 DIAGNOSIS — O99213 Obesity complicating pregnancy, third trimester: Secondary | ICD-10-CM

## 2023-06-11 DIAGNOSIS — O24313 Unspecified pre-existing diabetes mellitus in pregnancy, third trimester: Secondary | ICD-10-CM | POA: Diagnosis not present

## 2023-06-11 DIAGNOSIS — O0993 Supervision of high risk pregnancy, unspecified, third trimester: Secondary | ICD-10-CM

## 2023-06-11 DIAGNOSIS — Z98891 History of uterine scar from previous surgery: Secondary | ICD-10-CM | POA: Diagnosis not present

## 2023-06-11 DIAGNOSIS — E1165 Type 2 diabetes mellitus with hyperglycemia: Secondary | ICD-10-CM

## 2023-06-11 NOTE — Progress Notes (Signed)
 HIGH-RISK PREGNANCY VISIT Patient name: Allison Friedman MRN [id number]  Date of birth: 1995-07-31 Chief Complaint:   Routine Prenatal Visit  History of Present Illness:   Allison Friedman is a 28 y.o. G21P1011 female at [redacted]w[redacted]d with an Estimated Date of Delivery: 08/05/23 being seen today for ongoing management of a high-risk pregnancy complicated by:  -T2DM- Class B Did not bring log, notes slow improvement Based on dexcom ~ 10-15% still elevated.  Concern that range is larger than our target goal though pt states she has changed this -prior C-section- plan for repeat with BS -obesity -anxiety-no meds  Today she reports no complaints.   Contractions: Not present. Vag. Bleeding: None.  Movement: Present. denies leaking of fluid.      01/21/2023   10:53 AM 12/03/2022    2:11 PM 08/01/2022    3:03 PM 06/13/2022    9:41 AM 05/22/2022   11:06 AM  Depression screen PHQ 2/9  Decreased Interest 0 0 0 0 0  Down, Depressed, Hopeless 0 1 0 1 1  PHQ - 2 Score 0 1 0 1 1  Altered sleeping 2 2 2 2  0  Tired, decreased energy 1 1 2 2 2   Change in appetite 1 1 2 2 2   Feeling bad or failure about yourself  0 0 0 0 0  Trouble concentrating 0 0 0 0 0  Moving slowly or fidgety/restless 0 0 0 0 0  Suicidal thoughts 0 0 0 0 0  PHQ-9 Score 4 5 6 7 5   Difficult doing work/chores  Not difficult at all Not difficult at all Not difficult at all Not difficult at all     Current Outpatient Medications  Medication Instructions   Accu-Chek Softclix Lancets lancets Use as instructed to check blood sugar 4 times daily   aspirin EC 81 mg, Oral, Daily, Swallow whole.   Continuous Glucose Sensor (DEXCOM G7 SENSOR) MISC 1 Application, Subcutaneous, As directed, Change sensor every 10 days as directed.   Insulin Disposable Pump (OMNIPOD DASH INTRO, GEN 4,) KIT Change pod every 72 hours   Insulin Disposable Pump (OMNIPOD DASH PODS, GEN 4,) MISC Change pod every 72 hours   insulin lispro (HUMALOG) 100 UNIT/ML injection  Use with Omnipod for TDD around 100 units daily.   INSULIN SYRINGE .3CC/29GX1/2" 1 Units, Does not apply, 2 times daily   linaclotide (LINZESS) 145 mcg, Oral, Daily before breakfast   ondansetron (ZOFRAN-ODT) 4-8 mg, Oral, Every 6 hours PRN   Prenatal Vit-Fe Fumarate-FA (PRENATAL MULTIVITAMIN) TABS tablet 1 tablet, Daily     Review of Systems:   Pertinent items are noted in HPI Denies abnormal vaginal discharge w/ itching/odor/irritation, headaches, visual changes, shortness of breath, chest pain, abdominal pain, severe nausea/vomiting, or problems with urination or bowel movements unless otherwise stated above. Pertinent History Reviewed:  Reviewed past medical,surgical, social, obstetrical and family history.  Reviewed problem list, medications and allergies. Physical Assessment:   Vitals:   06/11/23 0930  BP: 109/74  Pulse: 76  Weight: 291 lb 9.6 oz (132.3 kg)  Body mass index is 50.05 kg/m.           Physical Examination:   General appearance: alert, well appearing, and in no distress  Mental status: normal mood, behavior, speech, dress, motor activity, and thought processes  Skin: warm & dry   Extremities:      Cardiovascular: normal heart rate noted  Respiratory: normal respiratory effort, no distress  Abdomen: gravid, soft, non-tender  Pelvic: Cervical exam deferred  Fetal Status:     Movement: Present    Fetal Surveillance Testing today: 32+1 wks,cephalic,BPP 8/8,FHR 144 bpm,posterior fundal placenta gr 1,AFI 16 cm,EFW 2212 g 81%    Chaperone: N/A    No results found for this or any previous visit (from the past 24 hours).   Assessment & Plan:  High-risk pregnancy: G3P1011 at [redacted]w[redacted]d with an Estimated Date of Delivery: 08/05/23   1. [redacted] weeks gestation of pregnancy   2. Class B DM^ basal 2.2u/hr, ^1:4 carb ratio -increase to 2.4u basal, continue current carb ratio -strongly encouraged pt to bring log every visit -continue growth q 4wks -antepartum testing  twice weekly -currently AGA, timing of delivery pending glycemic control  3. Plan for repeat C-section   4. Contraception  Patient desires permanent sterilization.  Other reversible forms of contraception were discussed with patient; she declines all other modalities. Risks of procedure discussed with patient including but not limited to: risk of regret, permanence of method, bleeding, infection, and potential injury to surrounding organs.  Discussed that especially in setting of salpingectomy, risk of ectopic pregnancy low, less than 1%.     Meds: No orders of the defined types were placed in this encounter.   Labs/procedures today: growth/BPP  Treatment Plan:  as outlined above  Reviewed: Preterm labor symptoms and general obstetric precautions including but not limited to vaginal bleeding, contractions, leaking of fluid and fetal movement were reviewed in detail with the patient.  All questions were answered. Pt has home bp cuff. Check bp weekly, let us  know if >140/90.   Follow-up: Return for twice weekly as scheduled.   Future Appointments  Date Time Provider Department Center  06/15/2023  9:30 AM Wendel Hals, NP RE[facility]-REA None  06/15/2023 10:00 AM Jodelle Mungo, RD NDM-NDMR None  06/16/2023 10:50 AM [facility]FT[facility]GY[facility] NURSE [facility] FTOBGYN  06/19/2023 10:45 AM [facility] - FT IMG 2 [facility] None  06/19/2023 11:30 AM Keene Pastures, DO [facility] FTOBGYN  06/23/2023 10:50 AM [facility]FT[facility]GYN NURSE [facility] FTOBGYN  06/26/2023 10:45 AM [facility] - FT IMG 2 [facility] None  06/26/2023 11:30 AM Wendelyn Halter, MD [facility] FTOBGYN  06/30/2023 10:50 AM [facility]GY[facility] NURSE [facility] FTOBGYN  07/03/2023 10:45 AM [facility] - FTOBGYN US  [facility]FT[facility]G None  07/03/2023 11:30 AM Wendelyn Halter, MD [facility] FTOBGYN  07/07/2023 10:50 AM [facility]FT[facility]GY[facility] NURSE [facility] FTOBGYN  07/10/2023 10:45 AM [facility] - FTOBGYN US  [facility]FT[facility]G None  07/10/2023 11:30 AM Keene Pastures, DO [facility] FTOBGYN  07/14/2023 10:50 AM [facility] NURSE [facility] FTOBGYN  07/17/2023 10:45 AM  [facility] - FTOBGYN US  [facility]G None  07/17/2023 11:30 AM Wendelyn Halter, MD [facility] FTOBGYN  07/21/2023 10:50 AM [facility]GY[facility] NURSE [facility] FTOBGYN  07/24/2023 10:45 AM [facility] - FTOBGYN US  [facility]FT[facility]G None  07/24/2023 11:30 AM Wendelyn Halter, MD [facility] FTOBGYN  07/28/2023 10:50 AM [facility]FT[facility]GY[facility] NURSE [facility] FTOBGYN  07/31/2023 10:45 AM [facility] - FTOBGYN US  [facility]-FTIMG None  08/04/2023 10:50 AM [facility]-FT[facility]GY[facility] NURSE [facility]-FT FTOBGYN    Orders Placed This Encounter  Procedures   AST   ALT   Bile acids, total    Keene Pastures, DO Attending Obstetrician & Gynecologist, [facility] for [employer], [facility]

## 2023-06-11 NOTE — Progress Notes (Signed)
 US  32+1 wks,cephalic,BPP 8/8,FHR 144 bpm,posterior fundal placenta gr 1,AFI 16 cm,EFW 2212 g 81%

## 2023-06-13 ENCOUNTER — Encounter: Payer: Self-pay | Admitting: Obstetrics & Gynecology

## 2023-06-13 LAB — AST: AST: 11 IU/L (ref 0–40)

## 2023-06-13 LAB — BILE ACIDS, TOTAL: Bile Acids Total: 1.6 umol/L (ref 0.0–10.0)

## 2023-06-13 LAB — ALT: ALT: 6 IU/L (ref 0–32)

## 2023-06-15 ENCOUNTER — Encounter: Attending: Obstetrics & Gynecology | Admitting: Nutrition

## 2023-06-15 ENCOUNTER — Ambulatory Visit: Admitting: Nurse Practitioner

## 2023-06-15 ENCOUNTER — Encounter: Payer: Self-pay | Admitting: Nurse Practitioner

## 2023-06-15 ENCOUNTER — Encounter: Payer: Self-pay | Admitting: Nutrition

## 2023-06-15 VITALS — BP 108/80 | HR 79 | Ht 64.0 in | Wt 292.8 lb

## 2023-06-15 DIAGNOSIS — O0993 Supervision of high risk pregnancy, unspecified, third trimester: Secondary | ICD-10-CM | POA: Insufficient documentation

## 2023-06-15 DIAGNOSIS — Z794 Long term (current) use of insulin: Secondary | ICD-10-CM | POA: Diagnosis not present

## 2023-06-15 DIAGNOSIS — O24113 Pre-existing diabetes mellitus, type 2, in pregnancy, third trimester: Secondary | ICD-10-CM | POA: Insufficient documentation

## 2023-06-15 DIAGNOSIS — Z3A32 32 weeks gestation of pregnancy: Secondary | ICD-10-CM | POA: Insufficient documentation

## 2023-06-15 DIAGNOSIS — E119 Type 2 diabetes mellitus without complications: Secondary | ICD-10-CM

## 2023-06-15 DIAGNOSIS — O24119 Pre-existing diabetes mellitus, type 2, in pregnancy, unspecified trimester: Secondary | ICD-10-CM

## 2023-06-15 DIAGNOSIS — E1165 Type 2 diabetes mellitus with hyperglycemia: Secondary | ICD-10-CM | POA: Diagnosis not present

## 2023-06-15 DIAGNOSIS — Z713 Dietary counseling and surveillance: Secondary | ICD-10-CM | POA: Insufficient documentation

## 2023-06-15 DIAGNOSIS — E118 Type 2 diabetes mellitus with unspecified complications: Secondary | ICD-10-CM | POA: Diagnosis not present

## 2023-06-15 MED ORDER — INSULIN LISPRO 100 UNIT/ML IJ SOLN: INTRAMUSCULAR | 3 refills | Status: DC

## 2023-06-15 NOTE — Progress Notes (Signed)
 Medical Nutrition Therapy  Appointment Start time:  [phone] Appointment End time:  0945  Primary concerns today: DM Type 2, Obesity  Referral diagnosis: E11.8, E66.01 Preferred learning style: No preference  Learning readiness: Ready    NUTRITION ASSESSMENT Follow up Dm During pregnancy  Looking to have baby May 2nd by C section at 37 weeks.. Craving honeydew. Trying to eat more vegetables. Gained 1 lb since last week. Dexcom showing  blood sugars trend up at night when she snacks. Omnipod pump and dexcom. She wants to get rid of the omnipod pump after pregnancy. She reports BP has been WNL. Trying to avoid salt. Still loves spicy foods. Craves sweets. Dexcom shows GMI 6.4%, 88% TIR, but that is higher guidelines than during pregnancy.  12% Above range.  Anthropometrics   Wt Readings from Last 3 Encounters:  06/15/23 292 lb 12.8 oz (132.8 kg)  06/11/23 291 lb 9.6 oz (132.3 kg)  06/02/23 292 lb (132.5 kg)   Ht Readings from Last 3 Encounters:  06/15/23 5\' 4"  (1.626 m)  05/30/23 5\' 4"  (1.626 m)  04/03/23 5\' 4"  (1.626 m)   There is no height or weight on file to calculate BMI. @BMIFA @ Facility age limit for growth %iles is 20 years. Facility age limit for growth %iles is 20 years.  Clinical Medical Hx: See chart Medications:  Current Outpatient Medications on File Prior to Visit  Medication Sig Dispense Refill   Accu-Chek Softclix Lancets lancets Use as instructed to check blood sugar 4 times daily 100 each 12   aspirin  EC 81 MG tablet Take 1 tablet (81 mg total) by mouth daily. Swallow whole. 30 tablet 12   Continuous Glucose Sensor (DEXCOM G7 SENSOR) MISC Inject 1 Application into the skin as directed. Change sensor every 10 days as directed. 3 each 11   Insulin  Disposable Pump (OMNIPOD DASH INTRO, GEN 4,) KIT Change pod every 72 hours 1 kit 0   Insulin  Disposable Pump (OMNIPOD DASH PODS, GEN 4,) MISC Change pod every 72 hours 10 each 6   insulin  lispro (HUMALOG ) 100  UNIT/ML injection Use with Omnipod for TDD around 120 units daily. 40 mL 3   Insulin  Syringe-Needle U-100 (INSULIN  SYRINGE .3CC/29GX1/2") 29G X 1/2" 0.3 ML MISC 1 Units by Does not apply route 2 (two) times daily. 200 each 6   linaclotide  (LINZESS ) 145 MCG CAPS capsule Take 1 capsule (145 mcg total) by mouth daily before breakfast. 30 capsule 5   ondansetron  (ZOFRAN -ODT) 4 MG disintegrating tablet Take 1-2 tablets (4-8 mg total) by mouth every 6 (six) hours as needed for nausea or vomiting. 20 tablet 0   Prenatal Vit-Fe Fumarate-FA (PRENATAL MULTIVITAMIN) TABS tablet Take 1 tablet by mouth daily at 12 noon.     No current facility-administered medications on file prior to visit.    Labs:  Lab Results  Component Value Date   HGBA1C 6.0 (A) 04/03/2023      Latest Ref Rng & Units 06/11/2023   10:07 AM 05/31/2023   12:28 AM 05/19/2023   12:09 PM  CMP  Glucose 70 - 99 mg/dL  95  960   BUN 6 - 20 mg/dL  10  8   Creatinine 4.54 - 1.00 mg/dL  0.98  1.19   Sodium 147 - 145 mmol/L  134  137   Potassium 3.5 - 5.1 mmol/L  3.9  4.4   Chloride 98 - 111 mmol/L  107  105   CO2 22 - 32 mmol/L  18  18  Calcium  8.9 - 10.3 mg/dL  8.7  9.0   Total Protein 6.5 - 8.1 g/dL  6.5  6.7   Total Bilirubin 0.0 - 1.2 mg/dL  0.4  <1.6   Alkaline Phos 38 - 126 U/L  50  70   AST 0 - 40 IU/L 11  11  7    ALT 0 - 32 IU/L 6  9  6     Lipid Panel     Component Value Date/Time   CHOL 158 02/20/2022 1050   TRIG 188 (H) 02/20/2022 1050   HDL 34 (L) 02/20/2022 1050   CHOLHDL 4.6 (H) 02/20/2022 1050   LDLCALC 92 02/20/2022 1050   LABVLDL 32 02/20/2022 1050    Notable Signs/Symptoms: none  Lifestyle & Dietary Hx Lives with her husband. Stays at home with her daughter.  Has been on depression medicaiton Estimated daily fluid intake: 40 oz Supplements: None Sleep: good Stress / self-care: her health and PTSD Current average weekly physical activity: Walking some  24-Hr Dietary Recall B)  Special K 1 cup, 1%  milk, water, Snack: fruit-honeydew  L) burger with 1/2 bun, potato salad 1 c,  water Snack: honeydew D) BBQ same   Estimated Energy Needs Calories: 1800 Carbohydrate: 200g Protein: 135g Fat: 56g   NUTRITION DIAGNOSIS  NB-1.1 Food and nutrition-related knowledge deficit As related to high calorie high carb diet.  As evidenced by BMI 48 and history of eating high calorie foods..   NUTRITION INTERVENTION  Nutrition education (E-1) on the following topics:   Gestational DM  States the definition of Gestational Diabetes States why dietary management is important in controlling blood glucose Describes the effects each nutrient has on blood glucose levels Demonstrates ability to create a balanced meal plan Demonstrates carbohydrate counting  States when to check blood glucose levels Demonstrates proper blood glucose monitoring techniques States the effect of stress and exercise on blood glucose levels States the importance of limiting caffeine and abstaining from alcohol and smoking Is on Dexom and Omnipod    *Patient received handouts: Nutrition Diabetes and Pregnancy Carbohydrate Counting List  Lifestyle Medicine  - Whole Food, Plant Predominant Nutrition is highly recommended: Eat Plenty of vegetables, Mushrooms, fruits, Legumes, Whole Grains, Nuts, seeds in lieu of processed meats, processed snacks/pastries red meat, poultry, eggs.    -It is better to avoid simple carbohydrates including: Cakes, Sweet Desserts, Ice Cream, Soda (diet and regular), Sweet Tea, Candies, Chips, Cookies, Store Bought Juices, Alcohol in Excess of  1-2 drinks a day, Lemonade,  Artificial Sweeteners, Doughnuts, Coffee Creamers, "Sugar-free" Products, etc, etc.  This is not a complete list.....  Exercise: If you are able: 30 -60 minutes a day ,4 days a week, or 150 minutes a week.  The longer the better.  Combine stretch, strength, and aerobic activities.  If you were told in the past that you have  high risk for cardiovascular diseases, you may seek evaluation by your heart doctor prior to initiating moderate to intense exercise programs.   Handouts Provided Include  Lifestyle Medicine Gestational DM  Learning Style & Readiness for Change Teaching method utilized: Visual & Auditory  Demonstrated degree of understanding via: Teach Back  Barriers to learning/adherence to lifestyle change: none  Goals Established by Pt Goals Add more protein and veggies at night when extra hungry Be sure to eat protein and 15 grams of carbs at 3 pm snack to reduce hunger at night. Keep drinking water Walk 30 minutes a day.  .Patient instructed to monitor  glucose levels: FBS: 60 - <90 1 hour: <140 2 hour: <120   MONITORING & EVALUATION Dietary intake, weekly physical activity, and weight 1 month She will  also be followed by Endocrinology.  Next Steps  Patient is to work on meal planning and eating more whole plant based foods.Aaron Aas

## 2023-06-15 NOTE — Patient Instructions (Signed)
 Increase protein rich foods and high fiber foods to help reduce elevated blood sugars. Drink more water-120 oz per day.

## 2023-06-15 NOTE — Progress Notes (Signed)
 Endocrinology Follow Up Note       06/15/2023, 1:16 PM   Subjective:    Patient ID: Allison Friedman, female    DOB: November 05, 1995.  Allison Friedman is being seen in follow up after being seen in consultation for management of currently controlled symptomatic diabetes requested by  Allison Huger, FNP.   Past Medical History:  Diagnosis Date   Anemia    Anxiety    Childhood asthma    Depression    Diabetes mellitus without complication (HCC)    Elevated lipase 10/25/2021   Migraines    Palpitations     Past Surgical History:  Procedure Laterality Date   CESAREAN SECTION N/A 05/08/2021   Procedure: CESAREAN SECTION;  Surgeon: Olin Bertin, DO;  Location: [facility];  Service: Obstetrics;  Laterality: N/A;   CYST REMOVAL NECK     infected sweat gland - cyst removal on the chest   NECK SURGERY      Social History   Socioeconomic History   Marital status: Married    Spouse name: Allison Friedman   Number of children: 1   Years of education: 12   Highest education level: GED or equivalent  Occupational History   Not on file  Tobacco Use   Smoking status: Never   Smokeless tobacco: Never  Vaping Use   Vaping status: Never Used  Substance and Sexual Activity   Alcohol use: Never   Drug use: Never   Sexual activity: Yes  Other Topics Concern   Not on file  Social History Narrative   Not on file   Social Drivers of Health   Financial Resource Strain: Low Risk  (01/21/2023)   Overall Financial Resource Strain (CARDIA)    Difficulty of Paying Living Expenses: Not hard at all  Food Insecurity: No Food Insecurity (01/21/2023)   Hunger Vital Sign    Worried About Running Out of Food in the Last Year: Never true    Ran Out of Food in the Last Year: Never true  Transportation Needs: No Transportation Needs (01/21/2023)   PRAPARE - Administrator, Civil Service (Medical): No    Lack of  Transportation (Non-Medical): No  Physical Activity: Insufficiently Active (01/21/2023)   Exercise Vital Sign    Days of Exercise per Week: 2 days    Minutes of Exercise per Session: 30 min  Stress: No Stress Concern Present (01/21/2023)   [employer] of Occupational Health - Occupational Stress Questionnaire    Feeling of Stress : Not at all  Social Connections: Moderately Isolated (01/21/2023)   Social Connection and Isolation Panel [NHANES]    Frequency of Communication with Friends and Family: More than three times a week    Frequency of Social Gatherings with Friends and Family: Once a week    Attends Religious Services: Never    Database administrator or Organizations: No    Attends Banker Meetings: Never    Marital Status: Married    Family History  Problem Relation Age of Onset   Depression Mother    Arthritis Mother    Anxiety disorder Mother    Diabetes Mother    Hypertension Mother  Diabetes Father    Alcohol abuse Father    Hypertension Father    Congenital Murmur Father    Heart attack Father        late 46's or early 74's   Hearing loss Brother        born deaf   Asthma Brother    Ovarian cancer Maternal Grandmother    Colon cancer Maternal Grandfather     Outpatient Encounter Medications as of 06/15/2023  Medication Sig   Accu-Chek Softclix Lancets lancets Use as instructed to check blood sugar 4 times daily   aspirin  EC 81 MG tablet Take 1 tablet (81 mg total) by mouth daily. Swallow whole.   Continuous Glucose Sensor (DEXCOM G7 SENSOR) MISC Inject 1 Application into the skin as directed. Change sensor every 10 days as directed.   Insulin  Disposable Pump (OMNIPOD DASH INTRO, GEN 4,) KIT Change pod every 72 hours   Insulin  Disposable Pump (OMNIPOD DASH PODS, GEN 4,) MISC Change pod every 72 hours   Insulin  Syringe-Needle U-100 (INSULIN  SYRINGE .3CC/29GX1/2") 29G X 1/2" 0.3 ML MISC 1 Units by Does not apply route 2 (two) times daily.    linaclotide  (LINZESS ) 145 MCG CAPS capsule Take 1 capsule (145 mcg total) by mouth daily before breakfast.   ondansetron  (ZOFRAN -ODT) 4 MG disintegrating tablet Take 1-2 tablets (4-8 mg total) by mouth every 6 (six) hours as needed for nausea or vomiting.   Prenatal Vit-Fe Fumarate-FA (PRENATAL MULTIVITAMIN) TABS tablet Take 1 tablet by mouth daily at 12 noon.   [DISCONTINUED] insulin  lispro (HUMALOG ) 100 UNIT/ML injection Use with Omnipod for TDD around 100 units daily.   insulin  lispro (HUMALOG ) 100 UNIT/ML injection Use with Omnipod for TDD around 120 units daily.   No facility-administered encounter medications on file as of 06/15/2023.    ALLERGIES: Allergies  Allergen Reactions   Metformin  And Related Diarrhea    Intestinal cramps   Prozac [Fluoxetine] Other (See Comments)    Reports suicidal thoughts with prozac or xanax--not willing to risk being on either medication   Xanax [Alprazolam] Other (See Comments)    Reports suicidal thoughts with prozac or xanax--not willing to risk being on either medication     VACCINATION STATUS: Immunization History  Administered Date(s) Administered   Influenza,inj,Quad PF,6+ Mos 11/13/2021   Influenza-Unspecified 12/05/2020   Moderna Sars-Covid-2 Vaccination 06/07/2019, 06/28/2019   PFIZER Comirnaty(Gray Top)Covid-19 Tri-Sucrose Vaccine 06/02/2019, 06/27/2019    Diabetes She presents for her follow-up diabetic visit. She has type 2 diabetes mellitus. Onset time: diagnosed at a approx age of 68. Her disease course has been stable. There are no hypoglycemic associated symptoms. There are no diabetic associated symptoms. There are no hypoglycemic complications. There are no diabetic complications. Risk factors for coronary artery disease include diabetes mellitus, family history, obesity and dyslipidemia. Current diabetic treatment includes insulin  pump (was previously on Ozempic  but stopped after positive pregnancy test). She is compliant with  treatment most of the time. Her weight is fluctuating minimally. She is following a generally healthy diet. When asked about meal planning, she reported none. She has not had a previous visit with a dietitian. She participates in exercise intermittently. Her home blood glucose trend is decreasing steadily. Her overall blood glucose range is 110-130 mg/dl. (She presents today with her Omnipod DASH and CGM showing at goal glycemic profile.  Her most recent A1c on 2/7 was 6%, she was not due for another one today.  Analysis of her CGM shows TIR 88%, TAR 12%, TBR 0%  with a GMI of 6.4%.  she notes she has been snacking a bit more at night.  Her OBGYN wanted to increase her insulin  to carb ratio from 1:7 to 1:4.  She is due with her baby boy at the end of next month.  At which time, she would like to switch back to Ozempic  to manage her diabetes.  She does NOT have plans to breastfeed.) An ACE inhibitor/angiotensin II receptor blocker is not being taken. She does not see a podiatrist.Eye exam is current.     Review of systems  Constitutional: + increasing body weight (pregnant),  current Body mass index is 50.26 kg/m. , no fatigue, no subjective hyperthermia, no subjective hypothermia Eyes: no blurry vision, no xerophthalmia ENT: no sore throat, no nodules palpated in throat, no dysphagia/odynophagia, no hoarseness Cardiovascular: no chest pain, no shortness of breath, no palpitations, no leg swelling Respiratory: no cough, no shortness of breath Gastrointestinal: no nausea/vomiting/diarrhea Musculoskeletal: no muscle/joint aches Skin: no rashes, no hyperemia Neurological: no tremors, no numbness, no tingling, no dizziness Psychiatric: no depression, no anxiety  Objective:     BP 108/80 (BP Location: Left Arm, Patient Position: Sitting, Cuff Size: Large)   Pulse 79   Ht 5\' 4"  (1.626 m)   Wt 292 lb 12.8 oz (132.8 kg)   LMP 10/09/2022 (Exact Date) Comment: early ultrasound on 12/09/22    size <  dates  EDD by early us  = 6-11---25  BMI 50.26 kg/m   Wt Readings from Last 3 Encounters:  06/15/23 291 lb (132 kg)  06/15/23 292 lb 12.8 oz (132.8 kg)  06/11/23 291 lb 9.6 oz (132.3 kg)     BP Readings from Last 3 Encounters:  06/15/23 108/80  06/11/23 109/74  06/02/23 106/74      Physical Exam- Limited  Constitutional:  Body mass index is 50.26 kg/m. , not in acute distress, normal state of mind Eyes:  EOMI, no exophthalmos Musculoskeletal: no gross deformities, strength intact in all four extremities, no gross restriction of joint movements Skin:  no rashes, no hyperemia Neurological: no tremor with outstretched hands   Diabetic Foot Exam - Simple   No data filed      CMP ( most recent) CMP     Component Value Date/Time   NA 134 (L) 05/31/2023 0028   NA 137 05/19/2023 1209   K 3.9 05/31/2023 0028   CL 107 05/31/2023 0028   CO2 18 (L) 05/31/2023 0028   GLUCOSE 95 05/31/2023 0028   BUN 10 05/31/2023 0028   BUN 8 05/19/2023 1209   CREATININE 0.53 05/31/2023 0028   CALCIUM  8.7 (L) 05/31/2023 0028   PROT 6.5 05/31/2023 0028   PROT 6.7 05/19/2023 1209   ALBUMIN 2.9 (L) 05/31/2023 0028   ALBUMIN 3.9 (L) 05/19/2023 1209   AST 11 06/11/2023 1007   ALT 6 06/11/2023 1007   ALKPHOS 50 05/31/2023 0028   BILITOT 0.4 05/31/2023 0028   BILITOT <0.2 05/19/2023 1209   EGFR 134 05/19/2023 1209   GFRNONAA >60 05/31/2023 0028     Diabetic Labs (most recent): Lab Results  Component Value Date   HGBA1C 6.0 (A) 04/03/2023   HGBA1C 6.5 (A) 01/02/2023   HGBA1C 6.4 (H) 08/25/2022     Lipid Panel ( most recent) Lipid Panel     Component Value Date/Time   CHOL 158 02/20/2022 1050   TRIG 188 (H) 02/20/2022 1050   HDL 34 (L) 02/20/2022 1050   CHOLHDL 4.6 (H) 02/20/2022 1050   LDLCALC  92 02/20/2022 1050   LABVLDL 32 02/20/2022 1050      Lab Results  Component Value Date   TSH 0.724 01/21/2023           Assessment & Plan:   1) Type 2 diabetes mellitus  without complication, without long-term current use of insulin  (HCC) 2) Pre-existing type 2 diabetes mellitus during pregnancy, antepartum  She presents today with her Omnipod DASH and CGM showing at goal glycemic profile.  Her most recent A1c on 2/7 was 6%, she was not due for another one today.  Analysis of her CGM shows TIR 88%, TAR 12%, TBR 0% with a GMI of 6.4%.  she notes she has been snacking a bit more at night.  Her OBGYN wanted to increase her insulin  to carb ratio from 1:7 to 1:4.  She is due with her baby boy at the end of next month.  At which time, she would like to switch back to Ozempic  to manage her diabetes.  She does NOT have plans to breastfeed.  - Kiyona Creech has currently uncontrolled symptomatic type 2 DM since 28 years of age.   -Recent labs reviewed.  - I had a long discussion with her about the progressive nature of diabetes and the pathology behind its complications. -her diabetes is not currently complicated but she remains at a high risk for more acute and chronic complications which include CAD, CVA, CKD, retinopathy, and neuropathy. These are all discussed in detail with her.  The following Lifestyle Medicine recommendations according to American College of Lifestyle Medicine [facility]) were discussed and offered to patient and she agrees to start the journey:  A. Whole Foods, Plant-based plate comprising of fruits and vegetables, plant-based proteins, whole-grain carbohydrates was discussed in detail with the patient.   A list for source of those nutrients were also provided to the patient.  Patient will use only water  or unsweetened tea for hydration. B.  The need to stay away from risky substances including alcohol, smoking; obtaining 7 to 9 hours of restorative sleep, at least 150 minutes of moderate intensity exercise weekly, the importance of healthy social connections,  and stress reduction techniques were discussed. C.  A full color page of  Calorie density of  various food groups per pound showing examples of each food groups was provided to the patient.  - Nutritional counseling repeated at each appointment due to patients tendency to fall back in to old habits.  - The patient admits there is a room for improvement in their diet and drink choices. -  Suggestion is made for the patient to avoid simple carbohydrates from their diet including Cakes, Sweet Desserts / Pastries, Ice Cream, Soda (diet and regular), Sweet Tea, Candies, Chips, Cookies, Sweet Pastries, Store Bought Juices, Alcohol in Excess of 1-2 drinks a day, Artificial Sweeteners, Coffee Creamer, and "Sugar-free" Products. This will help patient to have stable blood glucose profile and potentially avoid unintended weight gain.   - I encouraged the patient to switch to unprocessed or minimally processed complex starch and increased protein intake (animal or plant source), fruits, and vegetables.   - Patient is advised to stick to a routine mealtimes to eat 3 meals a day and avoid unnecessary snacks (to snack only to correct hypoglycemia).  - I have approached her with the following individualized plan to manage her diabetes and patient agrees:   -She is currently using her Omnipod DASH and CGM.  Insurance denied appeal for Omnipod 5. Her basal rate is  up to 2.2 units/hr.  I did change her insulin  correction factor to 25 instead of 43 to help drive down postprandial spikes a bit more.  -she is encouraged to continue monitoring glucose 4 times daily (using her CGM), before meals and before bed, and to call the clinic if her glucose is below 60 or above 180 for 3 tests in a row.  I encouraged her to reach out if glucose is off so we can adjust settings further if needed.  - she is warned not to take insulin  without proper monitoring per orders. - Adjustment parameters are given to her for hypo and hyperglycemia in writing.  -She is not a candidate for incretin therapy at this time due to  pregnancy.  Insulin  is the safest treatment for her and the developing baby.  Once she has delivered the baby, she will be able to transition back to Ozempic  as she was on previously.  I encouraged her to reach out to me once she delivers her baby to discuss.  - Specific targets for  A1c; LDL, HDL, and Triglycerides were discussed with the patient.  3) Blood Pressure /Hypertension:  her blood pressure is controlled to target without the use of antihypertensive medications.    4) Lipids/Hyperlipidemia:    Review of her recent lipid panel from 02/20/22 showed controlled LDL at 92 and elevated triglycerides of 188.  She is not currently on any lipid lowering medications.   5)  Weight/Diet:  her Body mass index is 50.26 kg/m.  -  clearly complicating her diabetes care.   she is a candidate for weight loss. I discussed with her the fact that loss of 5 - 10% of her  current body weight will have the most impact on her diabetes management.  Exercise, and detailed carbohydrates information provided  -  detailed on discharge instructions.  6) Chronic Care/Health Maintenance: -she is not on ACEI/ARB or Statin medications and is encouraged to initiate and continue to follow up with Ophthalmology, Dentist, Podiatrist at least yearly or according to recommendations, and advised to stay away from smoking. I have recommended yearly flu vaccine and pneumonia vaccine at least every 5 years; moderate intensity exercise for up to 150 minutes weekly; and sleep for at least 7 hours a day.  - she is advised to maintain close follow up with Allison Huger, FNP for primary care needs, as well as her other providers for optimal and coordinated care.     I spent  45  minutes in the care of the patient today including review of labs from CMP, Lipids, Thyroid  Function, Hematology (current and previous including abstractions from other facilities); face-to-face time discussing  her blood glucose readings/logs,  discussing hypoglycemia and hyperglycemia episodes and symptoms, medications doses, her options of short and long term treatment based on the latest standards of care / guidelines;  discussion about incorporating lifestyle medicine;  and documenting the encounter. Risk reduction counseling performed per USPSTF guidelines to reduce obesity and cardiovascular risk factors.     Please refer to Patient Instructions for Blood Glucose Monitoring and Insulin /Medications Dosing Guide"  in media tab for additional information. Please  also refer to " Patient Self Inventory" in the Media  tab for reviewed elements of pertinent patient history.  Glynn Lasso participated in the discussions, expressed understanding, and voiced agreement with the above plans.  All questions were answered to her satisfaction. she is encouraged to contact clinic should she have any questions or concerns prior to  her return visit.     Follow up plan: - Return in about 3 months (around 09/14/2023) for Diabetes F/U with A1c in office, No previsit labs, Bring meter and logs.   Hulon Magic, [facility] [address] [place], [place] [address] Phone: [phone] Fax: [phone]  06/15/2023, 1:16 PM

## 2023-06-16 ENCOUNTER — Other Ambulatory Visit: Payer: BC Managed Care – PPO

## 2023-06-16 ENCOUNTER — Ambulatory Visit: Payer: BC Managed Care – PPO | Admitting: *Deleted

## 2023-06-16 VITALS — BP 111/77 | HR 86 | Wt 289.8 lb

## 2023-06-16 DIAGNOSIS — Z3A32 32 weeks gestation of pregnancy: Secondary | ICD-10-CM

## 2023-06-16 DIAGNOSIS — O24113 Pre-existing diabetes mellitus, type 2, in pregnancy, third trimester: Secondary | ICD-10-CM | POA: Diagnosis not present

## 2023-06-16 NOTE — Progress Notes (Signed)
   NURSE VISIT- NST  SUBJECTIVE:  Allison Friedman is a 28 y.o. G45P1011 female at [redacted]w[redacted]d, here for a NST for pregnancy complicated by Diabetes: T2DM}.  She reports active fetal movement, contractions: none, vaginal bleeding: none, membranes: intact.   OBJECTIVE:  BP 111/77   Pulse 86   Wt 289 lb 12.8 oz (131.5 kg)   LMP 10/09/2022 (Exact Date) Comment: early ultrasound on 12/09/22    size < dates  EDD by early us  = 6-11---25  BMI 49.74 kg/m   Appears well, no apparent distress  No results found for this or any previous visit (from the past 24 hours).  NST: FHR baseline 140 bpm, Variability: moderate, Accelerations:present, Decelerations:  Absent= Cat 1/reactive Toco: none   ASSESSMENT: G3P1011 at [redacted]w[redacted]d with Diabetes: T2DM} NST reactive  PLAN: EFM strip reviewed by Benny Braver, CNM, [facility]   Recommendations: keep next appointment as scheduled    Kerrie Peek  06/16/2023 12:54 PM

## 2023-06-17 ENCOUNTER — Other Ambulatory Visit: Payer: BC Managed Care – PPO

## 2023-06-17 ENCOUNTER — Encounter: Payer: BC Managed Care – PPO | Admitting: Obstetrics & Gynecology

## 2023-06-19 ENCOUNTER — Encounter: Payer: Self-pay | Admitting: Obstetrics & Gynecology

## 2023-06-19 ENCOUNTER — Ambulatory Visit: Payer: BC Managed Care – PPO | Admitting: Obstetrics & Gynecology

## 2023-06-19 ENCOUNTER — Ambulatory Visit (INDEPENDENT_AMBULATORY_CARE_PROVIDER_SITE_OTHER): Payer: BC Managed Care – PPO | Admitting: Radiology

## 2023-06-19 VITALS — BP 107/71 | HR 96 | Wt 290.8 lb

## 2023-06-19 DIAGNOSIS — O24313 Unspecified pre-existing diabetes mellitus in pregnancy, third trimester: Secondary | ICD-10-CM

## 2023-06-19 DIAGNOSIS — Z3A33 33 weeks gestation of pregnancy: Secondary | ICD-10-CM

## 2023-06-19 DIAGNOSIS — Z6841 Body Mass Index (BMI) 40.0 and over, adult: Secondary | ICD-10-CM

## 2023-06-19 DIAGNOSIS — O24113 Pre-existing diabetes mellitus, type 2, in pregnancy, third trimester: Secondary | ICD-10-CM | POA: Diagnosis not present

## 2023-06-19 DIAGNOSIS — O0993 Supervision of high risk pregnancy, unspecified, third trimester: Secondary | ICD-10-CM

## 2023-06-19 DIAGNOSIS — O24319 Unspecified pre-existing diabetes mellitus in pregnancy, unspecified trimester: Secondary | ICD-10-CM

## 2023-06-19 DIAGNOSIS — Z98891 History of uterine scar from previous surgery: Secondary | ICD-10-CM

## 2023-06-19 NOTE — Progress Notes (Signed)
 HIGH-RISK PREGNANCY VISIT Patient name: Allison Friedman MRN [id number]  Date of birth: Aug 11, 1995 Chief Complaint:   Routine Prenatal Visit  History of Present Illness:   Allison Friedman is a 28 y.o. G8P1011 female at [redacted]w[redacted]d with an Estimated Date of Delivery: 08/05/23 being seen today for ongoing management of a high-risk pregnancy complicated by:  -Class B DM- has Dexcom/omnipod Patient did not bring sugar but states she has been seen by her endocrinologist who is overall happy and feels like her sugars are significantly improved A1c improved to 6 (from 6.5)  -prior C-section, desires repeat with BTS  Notes some pulling and stretching pain in her abdomen as well as difficulty sleeping.  Contractions: Irregular. Vag. Bleeding: None.  Movement: Present. denies leaking of fluid.      01/21/2023   10:53 AM 12/03/2022    2:11 PM 08/01/2022    3:03 PM 06/13/2022    9:41 AM 05/22/2022   11:06 AM  Depression screen PHQ 2/9  Decreased Interest 0 0 0 0 0  Down, Depressed, Hopeless 0 1 0 1 1  PHQ - 2 Score 0 1 0 1 1  Altered sleeping 2 2 2 2  0  Tired, decreased energy 1 1 2 2 2   Change in appetite 1 1 2 2 2   Feeling bad or failure about yourself  0 0 0 0 0  Trouble concentrating 0 0 0 0 0  Moving slowly or fidgety/restless 0 0 0 0 0  Suicidal thoughts 0 0 0 0 0  PHQ-9 Score 4 5 6 7 5   Difficult doing work/chores  Not difficult at all Not difficult at all Not difficult at all Not difficult at all     Current Outpatient Medications  Medication Instructions   Accu-Chek Softclix Lancets lancets Use as instructed to check blood sugar 4 times daily   aspirin  EC 81 mg, Oral, Daily, Swallow whole.   Continuous Glucose Sensor (DEXCOM G7 SENSOR) MISC 1 Application, Subcutaneous, As directed, Change sensor every 10 days as directed.   Insulin  Disposable Pump (OMNIPOD DASH INTRO, GEN 4,) KIT Change pod every 72 hours   Insulin  Disposable Pump (OMNIPOD DASH PODS, GEN 4,) MISC Change pod every 72  hours   insulin  lispro (HUMALOG ) 100 UNIT/ML injection Use with Omnipod for TDD around 120 units daily.   INSULIN  SYRINGE .3CC/29GX1/2" 1 Units, Does not apply, 2 times daily   linaclotide  (LINZESS ) 145 mcg, Oral, Daily before breakfast   ondansetron  (ZOFRAN -ODT) 4-8 mg, Oral, Every 6 hours PRN   Prenatal Vit-Fe Fumarate-FA (PRENATAL MULTIVITAMIN) TABS tablet 1 tablet, Daily     Review of Systems:   Pertinent items are noted in HPI Denies abnormal vaginal discharge w/ itching/odor/irritation, headaches, visual changes, shortness of breath, chest pain, abdominal pain, severe nausea/vomiting, or problems with urination or bowel movements unless otherwise stated above. Pertinent History Reviewed:  Reviewed past medical,surgical, social, obstetrical and family history.  Reviewed problem list, medications and allergies. Physical Assessment:   Vitals:   06/19/23 1134  BP: 107/71  Pulse: 96  Weight: 290 lb 12.8 oz (131.9 kg)  Body mass index is 49.92 kg/m.           Physical Examination:   General appearance: alert, well appearing, and in no distress  Mental status: normal mood, behavior, speech, dress, motor activity, and thought processes  Skin: warm & dry   Extremities:      Cardiovascular: normal heart rate noted  Respiratory: normal respiratory effort, no distress  Abdomen: gravid,  soft, non-tender  Pelvic: Cervical exam deferred         Fetal Status:     Movement: Present    Fetal Surveillance Testing today: 33+2 weeks Single active fetus, complete breech, FHR = 130 bpm, post pl, gr1,  AFI = 10.9 cm,  MVP = 4.4 cm, BPP = 8/8   Chaperone: N/A    No results found for this or any previous visit (from the past 24 hours).   Assessment & Plan:  High-risk pregnancy: G3P1011 at [redacted]w[redacted]d with an Estimated Date of Delivery: 08/05/23   1) Class B DM -no change in insulin  -continue antepartum testing -discussed timing of delivery pending DM control and growth -as of currently ~  37-39wks  2) prior C-section 3) Obesity  Meds: No orders of the defined types were placed in this encounter.   Labs/procedures today: BPP 8/8  Treatment Plan:  routine OB care and as outlined above  Reviewed: Preterm labor symptoms and general obstetric precautions including but not limited to vaginal bleeding, contractions, leaking of fluid and fetal movement were reviewed in detail with the patient.  All questions were answered. Pt has home bp cuff. Check bp weekly, let us  know if >140/90.   Follow-up: No follow-ups on file.   Future Appointments  Date Time Provider Department Center  06/23/2023 10:50 AM [facility] NURSE [facility]  06/26/2023 10:45 AM [facility]G 2 [facility] None  06/26/2023 11:30 AM Wendelyn Halter, MD [facility]  06/30/2023 10:50 AM [facility] NURSE [facility]  07/03/2023 10:45 AM [facility] US  [facility] None  07/03/2023 11:30 AM Wendelyn Halter, MD [facility]  07/07/2023 10:50 AM [facility] NURSE [facility]  07/10/2023 10:45 AM [facility] US  [facility] None  07/10/2023 11:30 AM Keene Pastures, DO [facility]  07/14/2023 10:50 AM [facility] NURSE [facility]  07/17/2023 10:45 AM [facility] US  [facility] None  07/17/2023 11:30 AM Wendelyn Halter, MD [facility]  07/21/2023 10:50 AM [facility] NURSE [facility]  07/24/2023 10:45 AM [facility] US  [facility] None  07/24/2023 11:30 AM Wendelyn Halter, MD [facility]  07/28/2023 10:50 AM [facility] NURSE [facility]  07/31/2023 10:45 AM [facility] US  [facility] None  08/04/2023 10:50 AM [facility] NURSE [facility]  09/14/2023 11:00 AM Jodelle Mungo, RD NDM-NDMR None  09/16/2023  9:30 AM Wendel Hals, NP [facility]-RE[facility] None    No orders of the defined types were placed in this encounter.   Jiovany Scheffel, DO Attending Obstetrician & Gynecologist, [facility] for [employer], [facility]

## 2023-06-19 NOTE — Progress Notes (Signed)
 US : GA = 33+2 weeks Single active fetus, complete breech, FHR = 130 bpm, post pl, gr1,  AFI = 10.9 cm,  MVP = 4.4 cm, BPP = 8/8

## 2023-06-20 ENCOUNTER — Other Ambulatory Visit: Payer: BC Managed Care – PPO

## 2023-06-23 ENCOUNTER — Ambulatory Visit: Payer: BC Managed Care – PPO | Admitting: Obstetrics & Gynecology

## 2023-06-23 VITALS — BP 102/70 | HR 83 | Temp 98.6°F | Wt 293.0 lb

## 2023-06-23 DIAGNOSIS — Z3A33 33 weeks gestation of pregnancy: Secondary | ICD-10-CM

## 2023-06-25 ENCOUNTER — Other Ambulatory Visit: Payer: Self-pay | Admitting: Obstetrics & Gynecology

## 2023-06-25 DIAGNOSIS — O24119 Pre-existing diabetes mellitus, type 2, in pregnancy, unspecified trimester: Secondary | ICD-10-CM

## 2023-06-25 DIAGNOSIS — Z6841 Body Mass Index (BMI) 40.0 and over, adult: Secondary | ICD-10-CM

## 2023-06-25 DIAGNOSIS — O34219 Maternal care for unspecified type scar from previous cesarean delivery: Secondary | ICD-10-CM

## 2023-06-26 ENCOUNTER — Ambulatory Visit: Payer: BC Managed Care – PPO | Admitting: Obstetrics & Gynecology

## 2023-06-26 ENCOUNTER — Ambulatory Visit: Payer: BC Managed Care – PPO | Admitting: Radiology

## 2023-06-26 VITALS — BP 114/79 | HR 82 | Wt 293.0 lb

## 2023-06-26 DIAGNOSIS — Z6841 Body Mass Index (BMI) 40.0 and over, adult: Secondary | ICD-10-CM | POA: Diagnosis not present

## 2023-06-26 DIAGNOSIS — O0993 Supervision of high risk pregnancy, unspecified, third trimester: Secondary | ICD-10-CM | POA: Diagnosis not present

## 2023-06-26 DIAGNOSIS — O24119 Pre-existing diabetes mellitus, type 2, in pregnancy, unspecified trimester: Secondary | ICD-10-CM

## 2023-06-26 DIAGNOSIS — O24113 Pre-existing diabetes mellitus, type 2, in pregnancy, third trimester: Secondary | ICD-10-CM | POA: Diagnosis not present

## 2023-06-26 DIAGNOSIS — Z3A34 34 weeks gestation of pregnancy: Secondary | ICD-10-CM | POA: Diagnosis not present

## 2023-06-26 DIAGNOSIS — O24319 Unspecified pre-existing diabetes mellitus in pregnancy, unspecified trimester: Secondary | ICD-10-CM

## 2023-06-26 DIAGNOSIS — O34219 Maternal care for unspecified type scar from previous cesarean delivery: Secondary | ICD-10-CM | POA: Diagnosis not present

## 2023-06-26 NOTE — Progress Notes (Signed)
 HIGH-RISK PREGNANCY VISIT Patient name: Allison Friedman MRN [id number]  Date of birth: 04/12/1995 Chief Complaint:   Routine Prenatal Visit  History of Present Illness:   Allison Friedman is a 28 y.o. G19P1011 female at [redacted]w[redacted]d with an Estimated Date of Delivery: 08/05/23 being seen today for ongoing management of a high-risk pregnancy complicated by Class B(type 2 DM) suboptimal control, adjusted ^2.6 basal, 1:4 sense SS 1/10>125.    Today she reports no complaints. Contractions: Irregular. Vag. Bleeding: None.  Movement: Present. denies leaking of fluid.      01/21/2023   10:53 AM 12/03/2022    2:11 PM 08/01/2022    3:03 PM 06/13/2022    9:41 AM 05/22/2022   11:06 AM  Depression screen PHQ 2/9  Decreased Interest 0 0 0 0 0  Down, Depressed, Hopeless 0 1 0 1 1  PHQ - 2 Score 0 1 0 1 1  Altered sleeping 2 2 2 2  0  Tired, decreased energy 1 1 2 2 2   Change in appetite 1 1 2 2 2   Feeling bad or failure about yourself  0 0 0 0 0  Trouble concentrating 0 0 0 0 0  Moving slowly or fidgety/restless 0 0 0 0 0  Suicidal thoughts 0 0 0 0 0  PHQ-9 Score 4 5 6 7 5   Difficult doing work/chores  Not difficult at all Not difficult at all Not difficult at all Not difficult at all        01/21/2023   10:53 AM 12/03/2022    2:09 PM 08/01/2022    3:04 PM 06/13/2022    9:44 AM  GAD 7 : Generalized Anxiety Score  Nervous, Anxious, on Edge 0 2 1 2   Control/stop worrying 0 1 0 0  Worry too much - different things 0 0 1 2  Trouble relaxing 0 0 0 1  Restless 0 0 0 0  Easily annoyed or irritable 2 2 2 2   Afraid - awful might happen 0 0 0 0  Total GAD 7 Score 2 5 4 7   Anxiety Difficulty  Not difficult at all Not difficult at all Not difficult at all     Review of Systems:   Pertinent items are noted in HPI Denies abnormal vaginal discharge w/ itching/odor/irritation, headaches, visual changes, shortness of breath, chest pain, abdominal pain, severe nausea/vomiting, or problems with urination or bowel  movements unless otherwise stated above. Pertinent History Reviewed:  Reviewed past medical,surgical, social, obstetrical and family history.  Reviewed problem list, medications and allergies. Physical Assessment:   Vitals:   06/26/23 1110  BP: 114/79  Pulse: 82  Weight: 293 lb (132.9 kg)  Body mass index is 50.29 kg/m.           Physical Examination:   General appearance: alert, well appearing, and in no distress  Mental status: alert, oriented to person, place, and time  Skin: warm & dry   Extremities:      Cardiovascular: normal heart rate noted  Respiratory: normal respiratory effort, no distress  Abdomen: gravid, soft, non-tender  Pelvic: Cervical exam deferred         Fetal Status:     Movement: Present    Fetal Surveillance Testing today: BPP 8/8   Chaperone:     No results found for this or any previous visit (from the past 24 hours).  Assessment & Plan:  High-risk pregnancy: G3P1011 at [redacted]w[redacted]d with an Estimated Date of Delivery: 08/05/23  ICD-10-CM   1. Supervision of high risk pregnancy in third trimester  O09.93     2. Class B DM^ basal 2.6 u/hr, 1:4 carb ratio//SS 1/10 > 125  O24.319         Meds: No orders of the defined types were placed in this encounter.   Orders: No orders of the defined types were placed in this encounter.    Labs/procedures today: U/S  Treatment Plan:  twice weekly surveillance, EFW 2 weeks timing of section depends on the EFW + CBG control this last month, 37w-39w   Follow-up: No follow-ups on file.   Future Appointments  Date Time Provider Department Center  06/30/2023 10:50 AM [facility] NURSE [facility]  07/03/2023 10:45 AM [facility] US  [facility] None  07/03/2023 11:30 AM Wendelyn Halter, MD [facility]  07/07/2023 10:50 AM [facility] NURSE [facility]  07/10/2023 10:45 AM [facility] US  [facility] None  07/10/2023 11:30 AM Keene Pastures, DO [facility]  07/14/2023 10:50 AM [facility] NURSE [facility]  FTOBGYN  07/17/2023 10:45 AM [facility] - [facility] US  [facility] None  07/17/2023 11:30 AM Wendelyn Halter, MD [facility]  07/21/2023 10:50 AM [facility] NURSE [facility]  07/24/2023 10:45 AM [facility] - [facility] US  [facility] None  07/24/2023 11:30 AM Wendelyn Halter, MD [facility]  07/28/2023 10:50 AM [facility] NURSE [facility]  07/31/2023 10:45 AM [facility] - [facility] US  [facility]FT[facility]G None  08/04/2023 10:50 AM [facility] NURSE [facility]  09/14/2023 11:00 AM Jodelle Mungo, RD NDM-NDMR None  09/16/2023  9:30 AM Wendel Hals, NP RE[facility]-REA None    No orders of the defined types were placed in this encounter.  Wendelyn Halter  Attending Physician for the Center for [facility] 06/26/2023 11:50 AM

## 2023-06-26 NOTE — Progress Notes (Signed)
 US : GA = 34+2 weeks Single active female fetus, cephalic, FHR = 147 bpm, AFI = 11.6 cm, 25%, MVP = 5.1 cm  BPP = 8/8

## 2023-06-30 ENCOUNTER — Ambulatory Visit: Payer: BC Managed Care – PPO | Admitting: *Deleted

## 2023-06-30 ENCOUNTER — Other Ambulatory Visit: Payer: Self-pay | Admitting: Obstetrics & Gynecology

## 2023-06-30 VITALS — BP 101/70 | HR 81 | Wt 295.0 lb

## 2023-06-30 DIAGNOSIS — O0993 Supervision of high risk pregnancy, unspecified, third trimester: Secondary | ICD-10-CM

## 2023-06-30 DIAGNOSIS — Z98891 History of uterine scar from previous surgery: Secondary | ICD-10-CM

## 2023-06-30 DIAGNOSIS — E1165 Type 2 diabetes mellitus with hyperglycemia: Secondary | ICD-10-CM

## 2023-06-30 DIAGNOSIS — Z3A34 34 weeks gestation of pregnancy: Secondary | ICD-10-CM | POA: Diagnosis not present

## 2023-06-30 NOTE — Progress Notes (Signed)
   NURSE VISIT- NST  SUBJECTIVE:  Allison Friedman is a 28 y.o. G41P1011 female at [redacted]w[redacted]d, here for a NST for pregnancy complicated by Diabetes: T2DM and Morbid obesity (BMI >=40).  She reports active fetal movement, contractions: occasional, vaginal bleeding: none, membranes: intact.   OBJECTIVE:  BP 101/70   Pulse 81   Wt 295 lb (133.8 kg)   LMP 10/09/2022 (Exact Date) Comment: early ultrasound on 12/09/22    size < dates  EDD by early us  = 6-11---25  BMI 50.64 kg/m   Appears well, no apparent distress  No results found for this or any previous visit (from the past 24 hours).  NST: FHR baseline 135 bpm, Variability: moderate, Accelerations:present, Decelerations:  Absent= Cat 1/reactive Toco: none   ASSESSMENT: G3P1011 at [redacted]w[redacted]d with Diabetes: T2DM and Morbid obesity (BMI >=40) NST reactive  PLAN: EFM strip reviewed by Benny Braver, CNM, [facility]   Recommendations: keep next appointment as scheduled    Laverne Potter  06/30/2023 12:19 PM

## 2023-06-30 NOTE — Progress Notes (Signed)
 Referral created for rpt C-section and BTL May 21  Allison Sickinger, DO Attending Obstetrician & Gynecologist, [facility] for [facility]

## 2023-07-02 ENCOUNTER — Other Ambulatory Visit: Payer: Self-pay | Admitting: Obstetrics & Gynecology

## 2023-07-02 DIAGNOSIS — O24113 Pre-existing diabetes mellitus, type 2, in pregnancy, third trimester: Secondary | ICD-10-CM

## 2023-07-03 ENCOUNTER — Ambulatory Visit: Payer: BC Managed Care – PPO

## 2023-07-03 ENCOUNTER — Encounter: Payer: Self-pay | Admitting: Obstetrics & Gynecology

## 2023-07-03 ENCOUNTER — Ambulatory Visit: Payer: BC Managed Care – PPO | Admitting: Obstetrics & Gynecology

## 2023-07-03 VITALS — BP 115/81 | HR 97 | Wt 295.0 lb

## 2023-07-03 DIAGNOSIS — O0993 Supervision of high risk pregnancy, unspecified, third trimester: Secondary | ICD-10-CM | POA: Diagnosis not present

## 2023-07-03 DIAGNOSIS — Z3A35 35 weeks gestation of pregnancy: Secondary | ICD-10-CM

## 2023-07-03 DIAGNOSIS — Z98891 History of uterine scar from previous surgery: Secondary | ICD-10-CM

## 2023-07-03 DIAGNOSIS — O24319 Unspecified pre-existing diabetes mellitus in pregnancy, unspecified trimester: Secondary | ICD-10-CM | POA: Diagnosis not present

## 2023-07-03 DIAGNOSIS — O24113 Pre-existing diabetes mellitus, type 2, in pregnancy, third trimester: Secondary | ICD-10-CM

## 2023-07-03 NOTE — Progress Notes (Signed)
 HIGH-RISK PREGNANCY VISIT Patient name: Allison Friedman MRN [id number]  Date of birth: Apr 28, 1995 Chief Complaint:   Routine Prenatal Visit  History of Present Illness:   Allison Friedman is a 28 y.o. [id number] female at [redacted]w[redacted]d with an Estimated Date of Delivery: 08/05/23 being seen today for ongoing management of a high-risk pregnancy complicated by    ICD-10-CM   1. Supervision of high risk pregnancy in third trimester  O09.93     2. [redacted] weeks gestation of pregnancy  Z3A.35     3. Class B DM^ basal 2.6 u/hr, 1:4 carb ratio//SS 1/10 > 125  O24.319     4. History of cesarean delivery  Z98.891      .    Today she reports no complaints. Contractions: Not present. Vag. Bleeding: None.  Movement: Present. denies leaking of fluid.      01/21/2023   10:53 AM 12/03/2022    2:11 PM 08/01/2022    3:03 PM 06/13/2022    9:41 AM 05/22/2022   11:06 AM  Depression screen PHQ 2/9  Decreased Interest 0 0 0 0 0  Down, Depressed, Hopeless 0 1 0 1 1  PHQ - 2 Score 0 1 0 1 1  Altered sleeping 2 2 2 2  0  Tired, decreased energy 1 1 2 2 2   Change in appetite 1 1 2 2 2   Feeling bad or failure about yourself  0 0 0 0 0  Trouble concentrating 0 0 0 0 0  Moving slowly or fidgety/restless 0 0 0 0 0  Suicidal thoughts 0 0 0 0 0  PHQ-9 Score 4 5 6 7 5   Difficult doing work/chores  Not difficult at all Not difficult at all Not difficult at all Not difficult at all        01/21/2023   10:53 AM 12/03/2022    2:09 PM 08/01/2022    3:04 PM 06/13/2022    9:44 AM  GAD 7 : Generalized Anxiety Score  Nervous, Anxious, on Edge 0 2 1 2   Control/stop worrying 0 1 0 0  Worry too much - different things 0 0 1 2  Trouble relaxing 0 0 0 1  Restless 0 0 0 0  Easily annoyed or irritable 2 2 2 2   Afraid - awful might happen 0 0 0 0  Total GAD 7 Score 2 5 4 7   Anxiety Difficulty  Not difficult at all Not difficult at all Not difficult at all     Review of Systems:   Pertinent items are noted in HPI Denies  abnormal vaginal discharge w/ itching/odor/irritation, headaches, visual changes, shortness of breath, chest pain, abdominal pain, severe nausea/vomiting, or problems with urination or bowel movements unless otherwise stated above. Pertinent History Reviewed:  Reviewed past medical,surgical, social, obstetrical and family history.  Reviewed problem list, medications and allergies. Physical Assessment:   Vitals:   07/03/23 1119  BP: 115/81  Pulse: 97  Weight: 295 lb (133.8 kg)  Body mass index is 50.64 kg/m.           Physical Examination:   General appearance: alert, well appearing, and in no distress  Mental status: alert, oriented to person, place, and time  Skin: warm & dry   Extremities:      Cardiovascular: normal heart rate noted  Respiratory: normal respiratory effort, no distress  Abdomen: gravid, soft, non-tender  Pelvic: Cervical exam deferred         Fetal Status:  Movement: Present    Fetal Surveillance Testing today: BPP 8/8   Chaperone:     No results found for this or any previous visit (from the past 24 hours).  Assessment & Plan:  High-risk pregnancy: [id number] at [redacted]w[redacted]d with an Estimated Date of Delivery: 08/05/23      ICD-10-CM   1. Supervision of high risk pregnancy in third trimester  O09.93     2. [redacted] weeks gestation of pregnancy  Z3A.35     3. Class B DM^ basal 2.6 u/hr, 1:4 carb ratio//SS 1/10 > 125  O24.319     4. History of cesarean delivery  Z98.891           Meds: No orders of the defined types were placed in this encounter.   Orders: No orders of the defined types were placed in this encounter.    Labs/procedures today: U/S  Treatment Plan:  schedule C section at 37 weeks    Follow-up: Return for keep scheduled.   Future Appointments  Date Time Provider Department Center  07/23/2023  2:10 PM Wendelyn Halter, MD [facility]  08/26/2023  9:50 AM Ozan, Jennifer, DO [facility]  09/14/2023 11:00 AM Jodelle Mungo, RD  NDM-NDMR None  09/16/2023  9:30 AM Wendel Hals, NP REA-REA None    No orders of the defined types were placed in this encounter.  Wendelyn Halter  Attending Physician for the Center for [facility] 07/20/2023 8:21 PM

## 2023-07-03 NOTE — Progress Notes (Signed)
 US  35+2 wks,cephalic,posterior placenta gr 1,FHR 161 bpm,AFI 16 cm,BPP 8/8

## 2023-07-07 ENCOUNTER — Other Ambulatory Visit: Payer: BC Managed Care – PPO

## 2023-07-09 ENCOUNTER — Other Ambulatory Visit: Payer: Self-pay | Admitting: Obstetrics & Gynecology

## 2023-07-09 DIAGNOSIS — O24112 Pre-existing diabetes mellitus, type 2, in pregnancy, second trimester: Secondary | ICD-10-CM

## 2023-07-10 ENCOUNTER — Ambulatory Visit: Payer: BC Managed Care – PPO | Admitting: Obstetrics & Gynecology

## 2023-07-10 ENCOUNTER — Other Ambulatory Visit (HOSPITAL_COMMUNITY)
Admission: RE | Admit: 2023-07-10 | Discharge: 2023-07-10 | Disposition: A | Discharge status: Home / Self Care | Source: Ambulatory Visit | Attending: Obstetrics & Gynecology | Admitting: Obstetrics & Gynecology

## 2023-07-10 ENCOUNTER — Ambulatory Visit (INDEPENDENT_AMBULATORY_CARE_PROVIDER_SITE_OTHER): Payer: BC Managed Care – PPO

## 2023-07-10 VITALS — BP 116/76 | HR 91 | Wt 299.0 lb

## 2023-07-10 DIAGNOSIS — O0993 Supervision of high risk pregnancy, unspecified, third trimester: Secondary | ICD-10-CM | POA: Diagnosis not present

## 2023-07-10 DIAGNOSIS — O24113 Pre-existing diabetes mellitus, type 2, in pregnancy, third trimester: Secondary | ICD-10-CM

## 2023-07-10 DIAGNOSIS — Z3A36 36 weeks gestation of pregnancy: Secondary | ICD-10-CM

## 2023-07-10 DIAGNOSIS — Z98891 History of uterine scar from previous surgery: Secondary | ICD-10-CM | POA: Diagnosis not present

## 2023-07-10 DIAGNOSIS — O24319 Unspecified pre-existing diabetes mellitus in pregnancy, unspecified trimester: Secondary | ICD-10-CM

## 2023-07-10 DIAGNOSIS — O24313 Unspecified pre-existing diabetes mellitus in pregnancy, third trimester: Secondary | ICD-10-CM

## 2023-07-10 DIAGNOSIS — E1165 Type 2 diabetes mellitus with hyperglycemia: Secondary | ICD-10-CM

## 2023-07-10 DIAGNOSIS — O24112 Pre-existing diabetes mellitus, type 2, in pregnancy, second trimester: Secondary | ICD-10-CM

## 2023-07-10 NOTE — Patient Instructions (Addendum)
 Allison Friedman  07/10/2023   Your procedure is scheduled on:  07/15/2023  Arrive at 12:30 pm at Entrance C on [address] at [facility]  and [employer]. You are invited to use the FREE valet parking or use the Visitor's parking deck.  Pick up the phone at the desk and dial [phone].  Call this number if you have problems the morning of surgery: [phone]  Remember:   Do not eat food:(After Midnight) Desps de medianoche. You may have clear liquids up til your arrival at 12:30 pm. Clear liquids means a liquid you can see thru. It may have color like cola or kool-aid. Tea is okay and coffee too just NO creamer or milk of any kind.  Take these medicines the morning of surgery with A SIP OF WATER:    Do not wear jewelry, make-up or nail polish.  Do not wear lotions, powders, or perfumes. Do not wear deodorant.  Do not shave 48 hours prior to surgery.  Do not bring valuables to the hospital.  [facility] is not   responsible for any belongings or valuables brought to the hospital.  Contacts, dentures or bridgework may not be worn into surgery.  Leave suitcase in the car. After surgery it may be brought to your room.  For patients admitted to the hospital, checkout time is 11:00 AM the day of              discharge.      Please read over the following fact sheets that you were given:     Preparing for Surgery

## 2023-07-10 NOTE — Progress Notes (Signed)
 US  36+2 wks,cephalic,BPP 8/8,posterior placenta gr 2,AFI 17 cm,FHR  140 bpm,EFW 3679 g 98%,AC 99%

## 2023-07-10 NOTE — Progress Notes (Addendum)
 HIGH-RISK PREGNANCY VISIT Patient name: Allison Friedman MRN [id number]  Date of birth: 1995-11-06 Chief Complaint:   Routine Prenatal Visit  History of Present Illness:   Allison Friedman is a 28 y.o. G84P1011 female at [redacted]w[redacted]d with an Estimated Date of Delivery: 08/05/23 being seen today for ongoing management of a high-risk pregnancy complicated by:  -Class B DM Pt has insulin  pump, did not bring log [place] wnl, 2hr PP 160s  Today she reports notes increased vaginal discharge- not sure if she just had a urinary accident or bag broken.   Contractions: Irritability. Vag. Bleeding: None.  Movement: Present.      01/21/2023   10:53 AM 12/03/2022    2:11 PM 08/01/2022    3:03 PM 06/13/2022    9:41 AM 05/22/2022   11:06 AM  Depression screen PHQ 2/9  Decreased Interest 0 0 0 0 0  Down, Depressed, Hopeless 0 1 0 1 1  PHQ - 2 Score 0 1 0 1 1  Altered sleeping 2 2 2 2  0  Tired, decreased energy 1 1 2 2 2   Change in appetite 1 1 2 2 2   Feeling bad or failure about yourself  0 0 0 0 0  Trouble concentrating 0 0 0 0 0  Moving slowly or fidgety/restless 0 0 0 0 0  Suicidal thoughts 0 0 0 0 0  PHQ-9 Score 4 5 6 7 5   Difficult doing work/chores  Not difficult at all Not difficult at all Not difficult at all Not difficult at all     Current Outpatient Medications  Medication Instructions   Accu-Chek Softclix Lancets lancets Use as instructed to check blood sugar 4 times daily   aspirin  EC 81 mg, Oral, Daily, Swallow whole.   Continuous Glucose Sensor (DEXCOM G7 SENSOR) MISC 1 Application, Subcutaneous, As directed, Change sensor every 10 days as directed.   Insulin  Disposable Pump (OMNIPOD DASH INTRO, GEN 4,) KIT Change pod every 72 hours   Insulin  Disposable Pump (OMNIPOD DASH PODS, GEN 4,) MISC Change pod every 72 hours   insulin  lispro (HUMALOG ) 100 UNIT/ML injection Use with Omnipod for TDD around 120 units daily.   INSULIN  SYRINGE .3CC/29GX1/2" 1 Units, Does not apply, 2 times daily    linaclotide  (LINZESS ) 145 mcg, Oral, Daily before breakfast   ondansetron  (ZOFRAN -ODT) 4-8 mg, Oral, Every 6 hours PRN   Prenat-FeCbn-FeAsp-Meth-FA-DHA (PRENATE MINI PO) 2 capsules, Daily     Review of Systems:   Pertinent items are noted in HPI Denies  headaches, visual changes, shortness of breath, chest pain, abdominal pain, severe nausea/vomiting, or problems with urination or bowel movements unless otherwise stated above. Pertinent History Reviewed:  Reviewed past medical,surgical, social, obstetrical and family history.  Reviewed problem list, medications and allergies. Physical Assessment:   Vitals:   07/10/23 1125  BP: 116/76  Pulse: 91  Weight: 299 lb (135.6 kg)  Body mass index is 51.32 kg/m.           Physical Examination:   General appearance: alert, well appearing, and in no distress  Mental status: normal mood, behavior, speech, dress, motor activity, and thought processes  Skin: warm & dry   Extremities:   no edema   Cardiovascular: normal heart rate noted  Respiratory: normal respiratory effort, no distress  Abdomen: obese, gravid, soft, non-tender  Pelvic: SSE: Cervix visualized- no pooling, physiologic discarge noted.  Cervix closed         Fetal Status:     Movement: Present  Fetal Surveillance Testing today: cephalic,BPP 8/8,posterior placenta gr 2,AFI 17 cm,FHR 140 bpm,EFW 3679 g 98%,AC 99%    Chaperone: Wendell Halt    No results found for this or any previous visit (from the past 24 hours).   Assessment & Plan:  High-risk pregnancy: G3P1011 at [redacted]w[redacted]d with an Estimated Date of Delivery: 08/05/23   1. Supervision of high risk pregnancy in third trimester (Primary)  - Cervicovaginal ancillary only - Culture, beta strep (group b only)  2. History of cesarean delivery -plan for repeat C_section with BTS  3. Class B DM^ basal 2.6 u/hr, 1:4 carb ratio//SS 1/10 > 125, uncontrolled -encouraged pt to increase by an additional 2 units meal  coverage -growth scan reflective of hyperglycemia, LGA, current percentile 98% -BPP 8/8 today -Continue antepartum testing -schedule for 37wk delivery  4. Morbid obesity (HCC)   5. [redacted] weeks gestation of pregnancy  - Cervicovaginal ancillary only - Culture, beta strep (group b only)    Meds: No orders of the defined types were placed in this encounter.   Labs/procedures today: growth scan/BPP  Treatment Plan:  as outlined above  Reviewed: Preterm labor symptoms and general obstetric precautions including but not limited to vaginal bleeding, contractions, leaking of fluid and fetal movement were reviewed in detail with the patient.  All questions were answered. PT has home bp cuff. Check bp weekly, let us  know if >140/90.   Follow-up: No follow-ups on file.   Future Appointments  Date Time Provider Department Center  07/13/2023 10:30 AM [facility] PAT 1 [facility]-INDC None  07/14/2023 10:50 AM [facility] NURSE [facility]  09/14/2023 11:00 AM Jodelle Mungo, RD NDM-NDMR None  09/16/2023  9:30 AM Wendel Hals, NP REA-REA None    Orders Placed This Encounter  Procedures   Culture, beta strep (group b only)    Iyan Flett, DO Attending Obstetrician & Gynecologist, [facility] Practice Center for [employer], [facility]

## 2023-07-13 ENCOUNTER — Encounter (HOSPITAL_COMMUNITY)
Admission: RE | Admit: 2023-07-13 | Discharge: 2023-07-13 | Disposition: A | Discharge status: Home / Self Care | Source: Ambulatory Visit | Attending: Family Medicine | Admitting: Family Medicine

## 2023-07-13 ENCOUNTER — Other Ambulatory Visit: Payer: Self-pay | Admitting: Family Medicine

## 2023-07-13 DIAGNOSIS — O99214 Obesity complicating childbirth: Secondary | ICD-10-CM | POA: Diagnosis not present

## 2023-07-13 DIAGNOSIS — O3663X0 Maternal care for excessive fetal growth, third trimester, not applicable or unspecified: Secondary | ICD-10-CM | POA: Diagnosis not present

## 2023-07-13 DIAGNOSIS — O0993 Supervision of high risk pregnancy, unspecified, third trimester: Secondary | ICD-10-CM | POA: Diagnosis not present

## 2023-07-13 DIAGNOSIS — Z98891 History of uterine scar from previous surgery: Secondary | ICD-10-CM

## 2023-07-13 DIAGNOSIS — E66813 Obesity, class 3: Secondary | ICD-10-CM | POA: Diagnosis not present

## 2023-07-13 DIAGNOSIS — O9962 Diseases of the digestive system complicating childbirth: Secondary | ICD-10-CM | POA: Diagnosis not present

## 2023-07-13 DIAGNOSIS — O24424 Gestational diabetes mellitus in childbirth, insulin controlled: Secondary | ICD-10-CM | POA: Diagnosis not present

## 2023-07-13 DIAGNOSIS — Z833 Family history of diabetes mellitus: Secondary | ICD-10-CM | POA: Diagnosis not present

## 2023-07-13 DIAGNOSIS — O2412 Pre-existing diabetes mellitus, type 2, in childbirth: Secondary | ICD-10-CM | POA: Diagnosis not present

## 2023-07-13 DIAGNOSIS — O34219 Maternal care for unspecified type scar from previous cesarean delivery: Secondary | ICD-10-CM | POA: Diagnosis not present

## 2023-07-13 DIAGNOSIS — Z3A37 37 weeks gestation of pregnancy: Secondary | ICD-10-CM | POA: Diagnosis not present

## 2023-07-13 DIAGNOSIS — Z3A00 Weeks of gestation of pregnancy not specified: Secondary | ICD-10-CM | POA: Diagnosis not present

## 2023-07-13 DIAGNOSIS — Z302 Encounter for sterilization: Secondary | ICD-10-CM | POA: Diagnosis not present

## 2023-07-13 DIAGNOSIS — O99284 Endocrine, nutritional and metabolic diseases complicating childbirth: Secondary | ICD-10-CM | POA: Diagnosis not present

## 2023-07-13 DIAGNOSIS — Z3A36 36 weeks gestation of pregnancy: Secondary | ICD-10-CM | POA: Diagnosis not present

## 2023-07-13 DIAGNOSIS — Z79899 Other long term (current) drug therapy: Secondary | ICD-10-CM | POA: Diagnosis not present

## 2023-07-13 DIAGNOSIS — Z7982 Long term (current) use of aspirin: Secondary | ICD-10-CM | POA: Diagnosis not present

## 2023-07-13 DIAGNOSIS — K219 Gastro-esophageal reflux disease without esophagitis: Secondary | ICD-10-CM | POA: Diagnosis not present

## 2023-07-13 DIAGNOSIS — O99344 Other mental disorders complicating childbirth: Secondary | ICD-10-CM | POA: Diagnosis not present

## 2023-07-13 DIAGNOSIS — Z794 Long term (current) use of insulin: Secondary | ICD-10-CM | POA: Diagnosis not present

## 2023-07-13 DIAGNOSIS — E785 Hyperlipidemia, unspecified: Secondary | ICD-10-CM | POA: Diagnosis not present

## 2023-07-13 DIAGNOSIS — O34211 Maternal care for low transverse scar from previous cesarean delivery: Secondary | ICD-10-CM | POA: Diagnosis not present

## 2023-07-13 DIAGNOSIS — Z8249 Family history of ischemic heart disease and other diseases of the circulatory system: Secondary | ICD-10-CM | POA: Diagnosis not present

## 2023-07-13 LAB — RPR: RPR Ser Ql: NONREACTIVE

## 2023-07-13 LAB — TYPE AND SCREEN
ABO/RH(D): O POS
Antibody Screen: NEGATIVE

## 2023-07-13 LAB — CERVICOVAGINAL ANCILLARY ONLY
Chlamydia: NEGATIVE
Comment: NEGATIVE
Comment: NORMAL
Neisseria Gonorrhea: NEGATIVE

## 2023-07-13 LAB — CBC
HCT: 37.3 % (ref 36.0–46.0)
Hemoglobin: 12.1 g/dL (ref 12.0–15.0)
MCH: 27.8 pg (ref 26.0–34.0)
MCHC: 32.4 g/dL (ref 30.0–36.0)
MCV: 85.6 fL (ref 80.0–100.0)
Platelets: 175 10*3/uL (ref 150–400)
RBC: 4.36 MIL/uL (ref 3.87–5.11)
RDW: 14.6 % (ref 11.5–15.5)
WBC: 6.1 10*3/uL (ref 4.0–10.5)
nRBC: 0 % (ref 0.0–0.2)

## 2023-07-14 ENCOUNTER — Ambulatory Visit: Payer: BC Managed Care – PPO

## 2023-07-14 VITALS — BP 87/59 | HR 77 | Wt 302.5 lb

## 2023-07-14 DIAGNOSIS — O0993 Supervision of high risk pregnancy, unspecified, third trimester: Secondary | ICD-10-CM

## 2023-07-14 DIAGNOSIS — Z3A36 36 weeks gestation of pregnancy: Secondary | ICD-10-CM

## 2023-07-14 LAB — CULTURE, BETA STREP (GROUP B ONLY): Strep Gp B Culture: NEGATIVE

## 2023-07-14 NOTE — Progress Notes (Signed)
   NURSE VISIT- NST  SUBJECTIVE:  Allison Friedman is a 28 y.o. G26P1011 female at [redacted]w[redacted]d, here for a NST for pregnancy complicated by Diabetes: T2DM and Morbid obesity (BMI >=40).  She reports active fetal movement, contractions: none, vaginal bleeding: none, membranes: intact.   OBJECTIVE:  BP (!) 87/59   Pulse 77   Wt (!) 302 lb 8 oz (137.2 kg)   LMP 10/09/2022 (Exact Date) Comment: early ultrasound on 12/09/22    size < dates  EDD by early us  = 6-11---25  BMI 51.92 kg/m   Appears well, no apparent distress  No results found for this or any previous visit (from the past 24 hours).  NST: FHR baseline 150 bpm, Variability: moderate, Accelerations:present, Decelerations:  Absent= Cat 1/reactive Toco: none   ASSESSMENT: G3P1011 at [redacted]w[redacted]d with Diabetes: T2DM and Morbid obesity (BMI >=40) NST reactive  PLAN: EFM strip reviewed by Dr. Randolm Butte   Recommendations: keep next appointment as scheduled    [name]  07/14/2023 11:51 AM

## 2023-07-15 ENCOUNTER — Inpatient Hospital Stay (HOSPITAL_COMMUNITY): Admitting: Anesthesiology

## 2023-07-15 ENCOUNTER — Encounter (HOSPITAL_COMMUNITY)
Admission: RE | Disposition: A | Discharge status: Home / Self Care | Payer: Self-pay | Source: Home / Self Care | Attending: Family Medicine

## 2023-07-15 ENCOUNTER — Other Ambulatory Visit: Payer: Self-pay

## 2023-07-15 ENCOUNTER — Encounter (HOSPITAL_COMMUNITY): Payer: Self-pay | Admitting: Family Medicine

## 2023-07-15 ENCOUNTER — Inpatient Hospital Stay (HOSPITAL_COMMUNITY)
Admission: RE | Admit: 2023-07-15 | Discharge: 2023-07-17 | DRG: 783 | Disposition: A | Discharge status: Home / Self Care | Attending: Family Medicine | Admitting: Family Medicine

## 2023-07-15 DIAGNOSIS — K219 Gastro-esophageal reflux disease without esophagitis: Secondary | ICD-10-CM | POA: Diagnosis present

## 2023-07-15 DIAGNOSIS — Z8249 Family history of ischemic heart disease and other diseases of the circulatory system: Secondary | ICD-10-CM | POA: Diagnosis not present

## 2023-07-15 DIAGNOSIS — F32A Depression, unspecified: Secondary | ICD-10-CM | POA: Diagnosis present

## 2023-07-15 DIAGNOSIS — Z302 Encounter for sterilization: Secondary | ICD-10-CM | POA: Diagnosis not present

## 2023-07-15 DIAGNOSIS — Z9079 Acquired absence of other genital organ(s): Secondary | ICD-10-CM

## 2023-07-15 DIAGNOSIS — K760 Fatty (change of) liver, not elsewhere classified: Secondary | ICD-10-CM | POA: Diagnosis present

## 2023-07-15 DIAGNOSIS — Z98891 History of uterine scar from previous surgery: Principal | ICD-10-CM

## 2023-07-15 DIAGNOSIS — G43709 Chronic migraine without aura, not intractable, without status migrainosus: Secondary | ICD-10-CM | POA: Diagnosis present

## 2023-07-15 DIAGNOSIS — O34211 Maternal care for low transverse scar from previous cesarean delivery: Principal | ICD-10-CM | POA: Diagnosis present

## 2023-07-15 DIAGNOSIS — O99284 Endocrine, nutritional and metabolic diseases complicating childbirth: Secondary | ICD-10-CM | POA: Diagnosis present

## 2023-07-15 DIAGNOSIS — E785 Hyperlipidemia, unspecified: Secondary | ICD-10-CM | POA: Diagnosis present

## 2023-07-15 DIAGNOSIS — Z833 Family history of diabetes mellitus: Secondary | ICD-10-CM | POA: Diagnosis not present

## 2023-07-15 DIAGNOSIS — O34219 Maternal care for unspecified type scar from previous cesarean delivery: Secondary | ICD-10-CM | POA: Diagnosis not present

## 2023-07-15 DIAGNOSIS — O3663X0 Maternal care for excessive fetal growth, third trimester, not applicable or unspecified: Secondary | ICD-10-CM | POA: Diagnosis present

## 2023-07-15 DIAGNOSIS — O9962 Diseases of the digestive system complicating childbirth: Secondary | ICD-10-CM | POA: Diagnosis present

## 2023-07-15 DIAGNOSIS — O24424 Gestational diabetes mellitus in childbirth, insulin controlled: Secondary | ICD-10-CM | POA: Diagnosis not present

## 2023-07-15 DIAGNOSIS — Z79899 Other long term (current) drug therapy: Secondary | ICD-10-CM | POA: Diagnosis not present

## 2023-07-15 DIAGNOSIS — F419 Anxiety disorder, unspecified: Secondary | ICD-10-CM | POA: Diagnosis present

## 2023-07-15 DIAGNOSIS — Z794 Long term (current) use of insulin: Secondary | ICD-10-CM

## 2023-07-15 DIAGNOSIS — Z3A37 37 weeks gestation of pregnancy: Secondary | ICD-10-CM

## 2023-07-15 DIAGNOSIS — F339 Major depressive disorder, recurrent, unspecified: Secondary | ICD-10-CM | POA: Diagnosis present

## 2023-07-15 DIAGNOSIS — Z7982 Long term (current) use of aspirin: Secondary | ICD-10-CM

## 2023-07-15 DIAGNOSIS — O2412 Pre-existing diabetes mellitus, type 2, in childbirth: Secondary | ICD-10-CM | POA: Diagnosis present

## 2023-07-15 DIAGNOSIS — O0993 Supervision of high risk pregnancy, unspecified, third trimester: Secondary | ICD-10-CM

## 2023-07-15 DIAGNOSIS — E66813 Obesity, class 3: Secondary | ICD-10-CM | POA: Diagnosis present

## 2023-07-15 DIAGNOSIS — O0990 Supervision of high risk pregnancy, unspecified, unspecified trimester: Secondary | ICD-10-CM

## 2023-07-15 DIAGNOSIS — O99214 Obesity complicating childbirth: Secondary | ICD-10-CM | POA: Diagnosis present

## 2023-07-15 DIAGNOSIS — E1169 Type 2 diabetes mellitus with other specified complication: Secondary | ICD-10-CM | POA: Diagnosis present

## 2023-07-15 DIAGNOSIS — E119 Type 2 diabetes mellitus without complications: Secondary | ICD-10-CM

## 2023-07-15 DIAGNOSIS — Z3490 Encounter for supervision of normal pregnancy, unspecified, unspecified trimester: Secondary | ICD-10-CM

## 2023-07-15 DIAGNOSIS — Z3A00 Weeks of gestation of pregnancy not specified: Secondary | ICD-10-CM | POA: Diagnosis not present

## 2023-07-15 DIAGNOSIS — O99344 Other mental disorders complicating childbirth: Secondary | ICD-10-CM | POA: Diagnosis not present

## 2023-07-15 LAB — GLUCOSE, CAPILLARY: Glucose-Capillary: 103 mg/dL — ABNORMAL HIGH (ref 70–99)

## 2023-07-15 SURGERY — Surgical Case
Anesthesia: Spinal | Laterality: Bilateral

## 2023-07-15 MED ORDER — LACTATED RINGERS IV SOLN: INTRAVENOUS | Status: DC

## 2023-07-15 MED ORDER — MEASLES, MUMPS & RUBELLA VAC IJ SOLR: 0.5000 mL | Freq: Once | INTRAMUSCULAR | Status: DC

## 2023-07-15 MED ORDER — NALOXONE HCL 4 MG/10ML IJ SOLN: 1.0000 ug/kg/h | INTRAVENOUS | Status: DC | PRN

## 2023-07-15 MED ORDER — PHENYLEPHRINE HCL (PRESSORS) 10 MG/ML IV SOLN
INTRAVENOUS | Status: DC | PRN
  Administered 2023-07-15 (×2): 80 ug via INTRAVENOUS

## 2023-07-15 MED ORDER — ACETAMINOPHEN 500 MG PO TABS
ORAL_TABLET | ORAL | Status: AC
  Filled 2023-07-15: qty 2

## 2023-07-15 MED ORDER — ZOLPIDEM TARTRATE 5 MG PO TABS: 5.0000 mg | ORAL_TABLET | Freq: Every evening | ORAL | Status: DC | PRN

## 2023-07-15 MED ORDER — STERILE WATER FOR IRRIGATION IR SOLN
Status: DC | PRN
  Administered 2023-07-15 (×2): 1

## 2023-07-15 MED ORDER — DIPHENHYDRAMINE HCL 50 MG/ML IJ SOLN: 12.5000 mg | INTRAMUSCULAR | Status: DC | PRN

## 2023-07-15 MED ORDER — DEXMEDETOMIDINE HCL IN NACL 80 MCG/20ML IV SOLN
INTRAVENOUS | Status: AC
Start: 2023-07-15 — End: ?
  Filled 2023-07-15: qty 20

## 2023-07-15 MED ORDER — SCOPOLAMINE 1 MG/3DAYS TD PT72
MEDICATED_PATCH | TRANSDERMAL | Status: AC
  Filled 2023-07-15: qty 1

## 2023-07-15 MED ORDER — KETOROLAC TROMETHAMINE 30 MG/ML IJ SOLN
30.0000 mg | Freq: Four times a day (QID) | INTRAMUSCULAR | Status: AC
  Administered 2023-07-15 – 2023-07-16 (×3): 30 mg via INTRAVENOUS
  Filled 2023-07-15 (×3): qty 1

## 2023-07-15 MED ORDER — KETOROLAC TROMETHAMINE 30 MG/ML IJ SOLN
INTRAMUSCULAR | Status: DC | PRN
Start: 2023-07-15 — End: 2023-07-15
  Administered 2023-07-15: 30 mg via INTRAVENOUS

## 2023-07-15 MED ORDER — CEFAZOLIN SODIUM-DEXTROSE 2-4 GM/100ML-% IV SOLN: 2.0000 g | INTRAVENOUS | Status: DC

## 2023-07-15 MED ORDER — WITCH HAZEL-GLYCERIN EX PADS: 1.0000 | MEDICATED_PAD | CUTANEOUS | Status: DC | PRN

## 2023-07-15 MED ORDER — ONDANSETRON HCL 4 MG/2ML IJ SOLN
INTRAMUSCULAR | Status: AC
  Filled 2023-07-15: qty 2

## 2023-07-15 MED ORDER — GABAPENTIN 300 MG PO CAPS
300.0000 mg | ORAL_CAPSULE | Freq: Once | ORAL | Status: AC
  Administered 2023-07-15: 300 mg via ORAL

## 2023-07-15 MED ORDER — POVIDONE-IODINE 10 % EX SWAB
2.0000 | Freq: Once | CUTANEOUS | Status: AC
  Administered 2023-07-15: 2 via TOPICAL

## 2023-07-15 MED ORDER — COCONUT OIL OIL: 1.0000 | TOPICAL_OIL | Status: DC | PRN

## 2023-07-15 MED ORDER — MEPERIDINE HCL 25 MG/ML IJ SOLN: 6.2500 mg | INTRAMUSCULAR | Status: DC | PRN

## 2023-07-15 MED ORDER — MENTHOL 3 MG MT LOZG: 1.0000 | LOZENGE | OROMUCOSAL | Status: DC | PRN

## 2023-07-15 MED ORDER — TRANEXAMIC ACID-NACL 1000-0.7 MG/100ML-% IV SOLN
INTRAVENOUS | Status: AC
  Filled 2023-07-15: qty 100

## 2023-07-15 MED ORDER — FAMOTIDINE 20 MG PO TABS
20.0000 mg | ORAL_TABLET | Freq: Once | ORAL | Status: AC
  Administered 2023-07-15: 20 mg via ORAL

## 2023-07-15 MED ORDER — OXYTOCIN-SODIUM CHLORIDE 30-0.9 UT/500ML-% IV SOLN
2.5000 [IU]/h | INTRAVENOUS | Status: AC
Start: 2023-07-15 — End: 2023-07-16
  Administered 2023-07-15: 2.5 [IU]/h via INTRAVENOUS
  Filled 2023-07-15: qty 500

## 2023-07-15 MED ORDER — KETOROLAC TROMETHAMINE 30 MG/ML IJ SOLN
INTRAMUSCULAR | Status: AC
  Filled 2023-07-15: qty 1

## 2023-07-15 MED ORDER — OXYTOCIN-SODIUM CHLORIDE 30-0.9 UT/500ML-% IV SOLN
INTRAVENOUS | Status: DC | PRN
  Administered 2023-07-15: 300 mL via INTRAVENOUS

## 2023-07-15 MED ORDER — PHENYLEPHRINE HCL-NACL 20-0.9 MG/250ML-% IV SOLN
INTRAVENOUS | Status: DC | PRN
  Administered 2023-07-15: 60 ug/min via INTRAVENOUS

## 2023-07-15 MED ORDER — GABAPENTIN 100 MG PO CAPS
100.0000 mg | ORAL_CAPSULE | Freq: Three times a day (TID) | ORAL | Status: DC
  Administered 2023-07-15 – 2023-07-17 (×6): 100 mg via ORAL
  Filled 2023-07-15 (×6): qty 1

## 2023-07-15 MED ORDER — GABAPENTIN 300 MG PO CAPS
ORAL_CAPSULE | ORAL | Status: AC
  Filled 2023-07-15: qty 1

## 2023-07-15 MED ORDER — DIBUCAINE (PERIANAL) 1 % EX OINT: 1.0000 | TOPICAL_OINTMENT | CUTANEOUS | Status: DC | PRN

## 2023-07-15 MED ORDER — INSULIN ASPART 100 UNIT/ML IJ SOLN: 0.0000 [IU] | Freq: Three times a day (TID) | INTRAMUSCULAR | Status: DC

## 2023-07-15 MED ORDER — ONDANSETRON HCL 4 MG/2ML IJ SOLN
INTRAMUSCULAR | Status: DC | PRN
  Administered 2023-07-15: 4 mg via INTRAVENOUS

## 2023-07-15 MED ORDER — SIMETHICONE 80 MG PO CHEW: 80.0000 mg | CHEWABLE_TABLET | ORAL | Status: DC | PRN

## 2023-07-15 MED ORDER — CEFAZOLIN SODIUM-DEXTROSE 3-4 GM/150ML-% IV SOLN
3.0000 g | INTRAVENOUS | Status: AC
  Administered 2023-07-15: 3 g via INTRAVENOUS

## 2023-07-15 MED ORDER — DIPHENHYDRAMINE HCL 25 MG PO CAPS: 25.0000 mg | ORAL_CAPSULE | ORAL | Status: DC | PRN

## 2023-07-15 MED ORDER — SODIUM CHLORIDE 0.9% FLUSH: 3.0000 mL | INTRAVENOUS | Status: DC | PRN

## 2023-07-15 MED ORDER — CEFAZOLIN SODIUM-DEXTROSE 3-4 GM/150ML-% IV SOLN
INTRAVENOUS | Status: AC
  Filled 2023-07-15: qty 150

## 2023-07-15 MED ORDER — PRENATAL MULTIVITAMIN CH
1.0000 | ORAL_TABLET | Freq: Every day | ORAL | Status: DC
  Administered 2023-07-16: 1 via ORAL
  Filled 2023-07-15: qty 1

## 2023-07-15 MED ORDER — ACETAMINOPHEN 500 MG PO TABS
1000.0000 mg | ORAL_TABLET | Freq: Three times a day (TID) | ORAL | Status: DC
  Administered 2023-07-15 – 2023-07-17 (×5): 1000 mg via ORAL
  Filled 2023-07-15 (×5): qty 2

## 2023-07-15 MED ORDER — ENOXAPARIN SODIUM 80 MG/0.8ML IJ SOSY
70.0000 mg | PREFILLED_SYRINGE | INTRAMUSCULAR | Status: DC
  Administered 2023-07-16 – 2023-07-17 (×2): 70 mg via SUBCUTANEOUS
  Filled 2023-07-15 (×2): qty 0.8

## 2023-07-15 MED ORDER — ONDANSETRON HCL 4 MG/2ML IJ SOLN: 4.0000 mg | Freq: Three times a day (TID) | INTRAMUSCULAR | Status: DC | PRN

## 2023-07-15 MED ORDER — SENNOSIDES-DOCUSATE SODIUM 8.6-50 MG PO TABS
2.0000 | ORAL_TABLET | Freq: Every day | ORAL | Status: DC
  Administered 2023-07-16: 2 via ORAL
  Filled 2023-07-15: qty 2

## 2023-07-15 MED ORDER — EPHEDRINE SULFATE-NACL 50-0.9 MG/10ML-% IV SOSY
PREFILLED_SYRINGE | INTRAVENOUS | Status: DC | PRN
  Administered 2023-07-15: 5 mg via INTRAVENOUS

## 2023-07-15 MED ORDER — BUPIVACAINE IN DEXTROSE 0.75-8.25 % IT SOLN
INTRATHECAL | Status: DC | PRN
  Administered 2023-07-15: 1.6 mL via INTRATHECAL

## 2023-07-15 MED ORDER — MEDROXYPROGESTERONE ACETATE 150 MG/ML IM SUSP: 150.0000 mg | INTRAMUSCULAR | Status: DC | PRN

## 2023-07-15 MED ORDER — TRANEXAMIC ACID-NACL 1000-0.7 MG/100ML-% IV SOLN
1000.0000 mg | Freq: Once | INTRAVENOUS | Status: AC
  Administered 2023-07-15: 1000 mg via INTRAVENOUS

## 2023-07-15 MED ORDER — FAMOTIDINE 20 MG PO TABS
ORAL_TABLET | ORAL | Status: AC
Start: 2023-07-15 — End: ?
  Filled 2023-07-15: qty 1

## 2023-07-15 MED ORDER — DIPHENHYDRAMINE HCL 25 MG PO CAPS: 25.0000 mg | ORAL_CAPSULE | Freq: Four times a day (QID) | ORAL | Status: DC | PRN

## 2023-07-15 MED ORDER — FENTANYL CITRATE (PF) 100 MCG/2ML IJ SOLN
INTRAMUSCULAR | Status: AC
  Filled 2023-07-15: qty 2

## 2023-07-15 MED ORDER — FENTANYL CITRATE (PF) 100 MCG/2ML IJ SOLN
INTRAMUSCULAR | Status: DC | PRN
  Administered 2023-07-15: 15 ug via INTRATHECAL

## 2023-07-15 MED ORDER — OXYCODONE HCL 5 MG PO TABS: 5.0000 mg | ORAL_TABLET | ORAL | Status: DC | PRN

## 2023-07-15 MED ORDER — SIMETHICONE 80 MG PO CHEW
80.0000 mg | CHEWABLE_TABLET | Freq: Three times a day (TID) | ORAL | Status: DC
  Administered 2023-07-16 (×2): 80 mg via ORAL
  Filled 2023-07-15 (×2): qty 1

## 2023-07-15 MED ORDER — SCOPOLAMINE 1 MG/3DAYS TD PT72
1.0000 | MEDICATED_PATCH | TRANSDERMAL | Status: DC
  Administered 2023-07-15: 1.5 mg via TRANSDERMAL

## 2023-07-15 MED ORDER — SCOPOLAMINE 1 MG/3DAYS TD PT72: 1.0000 | MEDICATED_PATCH | Freq: Once | TRANSDERMAL | Status: DC

## 2023-07-15 MED ORDER — MORPHINE SULFATE (PF) 0.5 MG/ML IJ SOLN
INTRAMUSCULAR | Status: AC
Start: 2023-07-15 — End: ?
  Filled 2023-07-15: qty 10

## 2023-07-15 MED ORDER — HYDROMORPHONE HCL 1 MG/ML IJ SOLN: 0.2500 mg | INTRAMUSCULAR | Status: DC | PRN

## 2023-07-15 MED ORDER — ACETAMINOPHEN 500 MG PO TABS
1000.0000 mg | ORAL_TABLET | Freq: Once | ORAL | Status: AC
  Administered 2023-07-15: 1000 mg via ORAL

## 2023-07-15 MED ORDER — MORPHINE SULFATE (PF) 0.5 MG/ML IJ SOLN
INTRAMUSCULAR | Status: DC | PRN
  Administered 2023-07-15: 150 ug via INTRATHECAL

## 2023-07-15 MED ORDER — NALOXONE HCL 0.4 MG/ML IJ SOLN: 0.4000 mg | INTRAMUSCULAR | Status: DC | PRN

## 2023-07-15 MED ORDER — IBUPROFEN 800 MG PO TABS
800.0000 mg | ORAL_TABLET | Freq: Three times a day (TID) | ORAL | Status: DC
  Administered 2023-07-16 – 2023-07-17 (×2): 800 mg via ORAL
  Filled 2023-07-15 (×2): qty 1

## 2023-07-15 MED ORDER — DEXMEDETOMIDINE HCL IN NACL 80 MCG/20ML IV SOLN
INTRAVENOUS | Status: DC | PRN
  Administered 2023-07-15 (×2): 8 ug via INTRAVENOUS

## 2023-07-15 MED ORDER — DROPERIDOL 2.5 MG/ML IJ SOLN: 0.6250 mg | Freq: Once | INTRAMUSCULAR | Status: DC | PRN

## 2023-07-15 SURGICAL SUPPLY — 31 items
CHLORAPREP W/TINT 26 (MISCELLANEOUS) ×2 IMPLANT
CLAMP UMBILICAL CORD (MISCELLANEOUS) ×1 IMPLANT
CLOTH BEACON ORANGE TIMEOUT ST (SAFETY) ×1 IMPLANT
DRESSING PREVENA PLUS CUSTOM (GAUZE/BANDAGES/DRESSINGS) IMPLANT
DRSG OPSITE POSTOP 4X10 (GAUZE/BANDAGES/DRESSINGS) ×1 IMPLANT
ELECTRODE REM PT RTRN 9FT ADLT (ELECTROSURGICAL) ×1 IMPLANT
EXTRACTOR VACUUM KIWI (MISCELLANEOUS) IMPLANT
GLOVE BIOGEL PI IND STRL 7.0 (GLOVE) ×2 IMPLANT
GLOVE ECLIPSE 7.0 STRL STRAW (GLOVE) ×1 IMPLANT
GOWN STRL REUS W/TWL LRG LVL3 (GOWN DISPOSABLE) ×2 IMPLANT
KIT ABG SYR 3ML LUER SLIP (SYRINGE) ×1 IMPLANT
LIGASURE IMPACT 36 18CM CVD LR (INSTRUMENTS) IMPLANT
MAT PREVALON FULL STRYKER (MISCELLANEOUS) IMPLANT
NDL HYPO 25X5/8 SAFETYGLIDE (NEEDLE) ×1 IMPLANT
NEEDLE HYPO 25X5/8 SAFETYGLIDE (NEEDLE) ×1 IMPLANT
NS IRRIG 1000ML POUR BTL (IV SOLUTION) ×1 IMPLANT
PACK C SECTION WH (CUSTOM PROCEDURE TRAY) ×1 IMPLANT
PAD OB MATERNITY 4.3X12.25 (PERSONAL CARE ITEMS) ×1 IMPLANT
RETRACTOR TRAXI PANNICULUS (MISCELLANEOUS) IMPLANT
RTRCTR C-SECT PINK 25CM LRG (MISCELLANEOUS) ×1 IMPLANT
SUT MNCRL 0 VIOLET CTX 36 (SUTURE) ×1 IMPLANT
SUT PLAIN 0 NONE (SUTURE) IMPLANT
SUT PLAIN 2 0 XLH (SUTURE) IMPLANT
SUT PLAIN ABS 2-0 CT1 27XMFL (SUTURE) IMPLANT
SUT VIC AB 0 CTX36XBRD ANBCTRL (SUTURE) ×1 IMPLANT
SUT VIC AB 2-0 CT1 TAPERPNT 27 (SUTURE) IMPLANT
SUT VIC AB 4-0 KS 27 (SUTURE) ×1 IMPLANT
SUTURE PLAIN GUT 2.0 ETHICON (SUTURE) IMPLANT
TOWEL OR 17X24 6PK STRL BLUE (TOWEL DISPOSABLE) ×1 IMPLANT
TRAY FOLEY W/BAG SLVR 14FR LF (SET/KITS/TRAYS/PACK) IMPLANT
WATER STERILE IRR 1000ML POUR (IV SOLUTION) ×1 IMPLANT

## 2023-07-15 NOTE — Transfer of Care (Signed)
 Immediate Anesthesia Transfer of Care Note  Patient: Allison Friedman  Procedure(s) Performed: CESAREAN SECTION, WITH BILATERAL TUBAL LIGATION (Bilateral)  Patient Location: PACU  Anesthesia Type:Spinal  Level of Consciousness: awake, alert , and oriented  Airway & Oxygen Therapy: Patient Spontanous Breathing  Post-op Assessment: Report given to RN and Post -op Vital signs reviewed and stable  Post vital signs: Reviewed and stable  Last Vitals:  Vitals Value Taken Time  BP 102/60 07/15/23 1641  Temp    Pulse 67 07/15/23 1643  Resp 20 07/15/23 1643  SpO2 93 % 07/15/23 1643  Vitals shown include unfiled device data.  Last Pain:  Vitals:   07/15/23 1638  TempSrc:   PainSc: 0-No pain         Complications: No notable events documented.

## 2023-07-15 NOTE — Anesthesia Postprocedure Evaluation (Signed)
 Anesthesia Post Note  Patient: Allison Friedman  Procedure(s) Performed: CESAREAN SECTION, WITH BILATERAL TUBAL LIGATION (Bilateral)     Patient location during evaluation: PACU Anesthesia Type: Spinal Level of consciousness: awake and alert Pain management: pain level controlled Vital Signs Assessment: post-procedure vital signs reviewed and stable Respiratory status: spontaneous breathing Cardiovascular status: stable Postop Assessment: spinal receding Anesthetic complications: no  No notable events documented.  Last Vitals:  Vitals:   07/15/23 1645 07/15/23 1700  BP: (!) 85/49 (!) 90/51  Pulse: 73 74  Resp: 16 14  Temp:    SpO2: 95% 98%    Last Pain:  Vitals:   07/15/23 1700  TempSrc:   PainSc: 0-No pain                 Gorman Laughter

## 2023-07-15 NOTE — Inpatient Diabetes Management (Signed)
 Inpatient Diabetes Program Recommendations  ADA Standards of Care 2025 Diabetes in Pregnancy Target Glucose Ranges:  Fasting: 70 - 95 mg/dL 1 hr postprandial:  161 - 140mg /dL (from first bite of meal) 2 hr postprandial:  100 - 120 mg/dL (from first bit of meal)    Lab Results  Component Value Date   GLUCAP 103 (H) 07/15/2023   HGBA1C 6.0 (A) 04/03/2023    Review of Glycemic Control  Latest Reference Range & Units 07/15/23 12:56  Glucose-Capillary 70 - 99 mg/dL 096 (H)   Diabetes history: DM 2 prior to pregnancy Outpatient Diabetes medications:  Omnipod insulin  pump started during pregnancy Class B DM^ basal 2.6 u/hr, 1:4 carb ratio//SS 1/10 > 125  Prior to Pregnancy-  Ozempic  Current orders for Inpatient glycemic control:  None  Inpatient Diabetes Program Recommendations:    Received page that patient is in periop for c-section.   Note that patient was not on insulin  prior to pregnancy.  Omnipod insulin  pump started during pregnancy with good control.   Chart review indicates that patient was not on insulin  prior to pregnancy.   -Consider d/c of insulin  pump now and add Novolog  sensitive correction q 4 hours after delivery- Likely will not need insulin  at discharge. Will follow.   Thanks,  Josefa Ni, RN, BC-ADM Inpatient Diabetes Coordinator Pager [phone]  (8a-5p)

## 2023-07-15 NOTE — Anesthesia Preprocedure Evaluation (Addendum)
 Anesthesia Evaluation  Patient identified by MRN, date of birth, ID band Patient awake    Reviewed: Allergy & Precautions, NPO status , Patient's Chart, lab work & pertinent test results  Airway Mallampati: III  TM Distance: >3 FB Neck ROM: Full    Dental  (+) Dental Advisory Given, Teeth Intact   Pulmonary asthma    Pulmonary exam normal breath sounds clear to auscultation       Cardiovascular negative cardio ROS Normal cardiovascular exam Rhythm:Regular Rate:Normal     Neuro/Psych  Headaches PSYCHIATRIC DISORDERS Anxiety Depression       GI/Hepatic Neg liver ROS,GERD  Controlled,,  Endo/Other  diabetes, Gestational  Class 3 obesity  Renal/GU negative Renal ROS     Musculoskeletal negative musculoskeletal ROS (+)    Abdominal  (+) + obese  Peds  Hematology negative hematology ROS (+) Blood dyscrasia, anemia   Anesthesia Other Findings   Reproductive/Obstetrics negative OB ROS (+) Pregnancy                              Anesthesia Physical Anesthesia Plan  ASA: 3  Anesthesia Plan: Spinal   Post-op Pain Management: Tylenol  PO (pre-op)*, Precedex, Toradol  IV (intra-op)* and Gabapentin PO (pre-op)*   Induction: Intravenous  PONV Risk Score and Plan: 2 and Ondansetron , Treatment may vary due to age or medical condition, Dexamethasone  and Scopolamine patch - Pre-op  Airway Management Planned: Natural Airway  Additional Equipment:   Intra-op Plan:   Post-operative Plan:   Informed Consent: I have reviewed the patients History and Physical, chart, labs and discussed the procedure including the risks, benefits and alternatives for the proposed anesthesia with the patient or authorized representative who has indicated his/her understanding and acceptance.     Dental advisory given  Plan Discussed with: CRNA  Anesthesia Plan Comments:          Anesthesia Quick  Evaluation

## 2023-07-15 NOTE — Discharge Summary (Addendum)
 Postpartum Discharge Summary       Patient Name: Allison Friedman DOB: 28-Jan-1996 MRN: [id number]  Date of admission: 07/15/2023 Delivery date:07/15/2023 Delivering provider: Teena Feast Date of discharge: 07/17/2023  Admitting diagnosis: Pregnancy [Z34.90] Intrauterine pregnancy: [redacted]w[redacted]d     Secondary diagnosis:  Principal Problem:   Status post repeat low transverse cesarean section Active Problems:   Type 2 diabetes mellitus   Anxiety and depression   Depression, recurrent (HCC)   Morbid obesity (HCC)   Fatty liver disease, nonalcoholic   GERD (gastroesophageal reflux disease)   Hyperlipidemia associated with type 2 diabetes mellitus (HCC)   Chronic migraine w/o aura w/o status migrainosus, not intractable   Supervision of high-risk pregnancy   Pregnancy   Status post bilateral salpingectomy  Additional problems: none    Discharge diagnosis: Term Pregnancy Delivered and Type 2 DM                                              Post partum procedures:none Augmentation: N/A Complications: None  Hospital course: Scheduled C/S   28 y.o. yo [id number] at [redacted]w[redacted]d was admitted to the hospital 07/15/2023 for scheduled cesarean section with the following indication:Elective Repeat and T2DM on insulin  with LGA. Delivery details are as follows:  Membrane Rupture Time/Date: 3:24 PM,07/15/2023  Delivery Method:C-Section, Low Transverse Operative Delivery: N/A Details of operation can be found in separate operative note.  Patient had a postpartum course complicated by nothing.  She is ambulating, tolerating a regular diet, passing flatus, and urinating well. Patient is discharged home in stable condition on  07/17/23.        Newborn Data: Birth date:07/15/2023 Birth time:3:25 PM Gender:Female Living status:Living Apgars:8 ,9  Weight:3850 g    Magnesium Sulfate received: No BMZ received: No Rhophylac:No MMR:No T-DaP:Given prenatally Flu: N/A RSV Vaccine received:  No Transfusion:No  Immunizations received: Immunization History  Administered Date(s) Administered   Influenza,inj,Quad PF,6+ Mos 11/13/2021   Influenza-Unspecified 12/05/2020   Moderna Sars-Covid-2 Vaccination 06/07/2019, 06/28/2019   PFIZER Comirnaty(Gray Top)Covid-19 Tri-Sucrose Vaccine 06/02/2019, 06/27/2019    Physical exam  Vitals:   07/16/23 0528 07/16/23 1300 07/16/23 1920 07/17/23 0432  BP: 103/66 119/63 129/77 130/87  Pulse: 74 67 65 72  Resp: 18 18 18 20   Temp: 97.6 F (36.4 C) 97.7 F (36.5 C)  97.8 F (36.6 C)  TempSrc: Oral Oral  Oral  SpO2: 97%  99% 99%  Weight:      Height:       General: alert, cooperative, and no distress Lochia: appropriate Uterine Fundus: firm Incision: Healing well with no significant drainage, No significant erythema, Dressing is clean, dry, and intact DVT Evaluation: No evidence of DVT seen on physical exam. Negative Homan's sign. No cords or calf tenderness. Labs: Lab Results  Component Value Date   WBC 7.4 07/16/2023   HGB 10.9 (L) 07/16/2023   HCT 32.9 (L) 07/16/2023   MCV 85.7 07/16/2023   PLT 139 (L) 07/16/2023      Latest Ref Rng & Units 06/11/2023   10:07 AM  CMP  AST 0 - 40 IU/L 11   ALT 0 - 32 IU/L 6    Edinburgh Score:    07/16/2023    9:50 AM  Edinburgh Postnatal Depression Scale Screening Tool  I have been able to laugh and see the funny side of things. --  No data recorded  After visit meds:  Allergies as of 07/17/2023       Reactions   Metformin  And Related Diarrhea   Intestinal cramps   Prozac [fluoxetine] Other (See Comments)   Reports suicidal thoughts with prozac or xanax--not willing to risk being on either medication   Xanax [alprazolam] Other (See Comments)   Reports suicidal thoughts with prozac or xanax--not willing to risk being on either medication        Medication List     STOP taking these medications    Accu-Chek Softclix Lancets lancets   aspirin  EC 81 MG tablet    linaclotide  145 MCG Caps capsule Commonly known as: Linzess    ondansetron  4 MG disintegrating tablet Commonly known as: ZOFRAN -ODT       TAKE these medications    acetaminophen  500 MG tablet Commonly known as: TYLENOL  Take 2 tablets (1,000 mg total) by mouth every 8 (eight) hours.   Dexcom G7 Sensor Misc Inject 1 Application into the skin as directed. Change sensor every 10 days as directed.   furosemide 20 MG tablet Commonly known as: Lasix Take 1 tablet (20 mg total) by mouth daily.   gabapentin 100 MG capsule Commonly known as: NEURONTIN Take 1 capsule (100 mg total) by mouth 3 (three) times daily for 7 days.   ibuprofen  800 MG tablet Commonly known as: ADVIL  Take 1 tablet (800 mg total) by mouth every 8 (eight) hours. Do not take toradol  within 12 hours   insulin  lispro 100 UNIT/ML injection Commonly known as: HumaLOG  Use with Omnipod for TDD around 80 units daily.   INSULIN  SYRINGE .3CC/29GX1/2" 29G X 1/2" 0.3 ML Misc 1 Units by Does not apply route 2 (two) times daily.   ketorolac  10 MG tablet Commonly known as: TORADOL  Take 1 tablet (10 mg total) by mouth every 6 (six) hours as needed. Do not take ibuprofen  within 12 hours   Omnipod DASH Pods (Gen 4) Misc Change pod every 72 hours   Omnipod DASH Intro (Gen 4) Kit Change pod every 72 hours   potassium chloride SA 20 MEQ tablet Commonly known as: KLOR-CON M Take 1 tablet (20 mEq total) by mouth daily. While taking the fluid pill   PRENATE MINI PO Take 2 capsules by mouth daily.         Discharge home in stable condition Infant Feeding: Breast Infant Disposition:home with mother Discharge instruction: per After Visit Summary and Postpartum booklet. Activity: Advance as tolerated. Pelvic rest for 6 weeks.  Diet: routine diet Future Appointments: Future Appointments  Date Time Provider Department Center  07/23/2023  2:10 PM Wendelyn Halter, MD [facility]  08/26/2023  9:50 AM Ozan, Jennifer, DO  [facility]  09/14/2023 11:00 AM Jodelle Mungo, RD [facility] None  09/16/2023  9:30 AM Wendel Hals, NP [facility] None   Follow up Visit:  [facility]/21  Please schedule this patient for a In person postpartum visit in 6 weeks with the following provider: Any provider. Additional Postpartum F/U: Incision check 1 week  High risk pregnancy complicated by: T2DM Delivery mode:  C-Section, Low Transverse Anticipated Birth Control:  BTS   07/17/2023 Majel Scott, CNM

## 2023-07-15 NOTE — Op Note (Signed)
 Cesarean Section Operative Note   Patient: Allison Friedman  Date of Procedure: 07/15/2023  Procedure: Repeat Low Transverse Cesarean and Bilateral Tubal Ligation via Bilateral salpingectomy   Indications: patient declines vag del attempt, elective repeat, previous uterine incision: low transverse and undesired fertility  Pre-operative Diagnosis: PREVIOUS CESAREAN SECTION, CLASS 2 DIABETES W/ BILATERAL TUBAL LIGATION.   Post-operative Diagnosis: Same and Bilateral Tubal Sterilization via Bilateral salpingectomy  TOLAC Candidate: No  Surgeon: Surgeons and Role:    * Teena Feast, MD - Primary    * Maud Sorenson, MD - Assisting  Assistants: An experienced assistant was required given the standard of surgical care given the complexity of the case.  This assistant was needed for exposure, dissection, suctioning, retraction, instrument exchange, assisting with delivery with administration of fundal pressure, and for overall help during the procedure.   Anesthesia: spinal  Anesthesiologist: Gorman Laughter, MD   Antibiotics: Cefazolin    Estimated Blood Loss: 356 ml   Total IV Fluids: 1000 ml  Urine Output: 200 cc OF clear urine  Specimens: bilateral fallopian tubes to pathology  Complications: no complications   Indications: Allison Friedman is a 28 y.o. G3P1011 with an IUP [redacted]w[redacted]d presenting for scheduled cesarean secondary to the indications listed above. Clinical course notable for T2DM requiring insulin  during pregnancy, obesity.  The risks of cesarean section were discussed with the patient including but were not limited to: bleeding which may require transfusion or reoperation; infection which may require antibiotics; injury to bowel, bladder, ureters or other surrounding organs; injury to the fetus; need for additional procedures including hysterectomy in the event of a life-threatening hemorrhage; placental abnormalities wth subsequent pregnancies, incisional  problems, thromboembolic phenomenon and other postoperative/anesthesia complications.  Patient also desires permanent sterilization.  Other reversible forms of contraception were discussed with patient; she declines all other modalities. Risks of procedure discussed with patient including but not limited to: risk of regret, permanence of method, bleeding, infection, injury to surrounding organs and need for additional procedures.  Failure risk of about 1% with increased risk of ectopic gestation if pregnancy occurs was also discussed with patient.  Also discussed possibility of post-tubal pain syndrome. The patient concurred with the proposed plan, giving informed written consent for the procedures.  Patient has been NPO since last night she will remain NPO for procedure. Anesthesia and OR aware.  Preoperative prophylactic antibiotics and SCDs ordered on call to the OR.   Findings: Viable infant in cephalic presentation, no nuchal cord present. Apgars 8, 9, . Weight 3850 g. Clear amniotic fluid. Normal placenta, three vessel cord. Normal uterus, Normal bilateral fallopian tubes, Normal bilateral ovaries. Moderate adhesive disease, between uterus/bladder, rectus/fascia.  Procedure Details: A Time Out was held and the above information confirmed. The patient received intravenous antibiotics and had sequential compression devices applied to her lower extremities preoperatively. The patient was taken back to the operative suite where spinal anesthesia was administered. After induction of anesthesia, the patient was draped and prepped in the usual sterile manner and placed in a dorsal supine position with a leftward tilt. A low transverse skin incision was made with scalpel and carried down through the subcutaneous tissue to the fascia. Fascial incision was made and extended transversely. The fascia was separated from the underlying rectus tissue superiorly and inferiorly. The rectus muscles were separated in the  midline bluntly and the peritoneum was entered bluntly. An Alexis retractor was placed to aid in visualization of the uterus. The utero-vesical peritoneal reflection was incised  transversely and the bladder flap was bluntly freed from the lower uterine segment. A low transverse uterine incision was made. The infant was successfully delivered from cephalic presentation, the umbilical cord was clamped after 1 minute. Cord ph was not sent, and cord blood was obtained for evaluation. The placenta was removed Intact and appeared normal. The uterine incision was closed with a single layer running unlocked suture of 0-Monocryl. Overall, excellent hemostasis was noted.  Attention was then turned to the fallopian tubes. The left Fallopian tube was identified and then traced to it's fimbriae. Using the Ligasure device and taking care to avoid large vascular structures, the left fallopian tube was removed sequentially from the fimbriae to the cornua, with excellent hemostasis noted. Attention was then turned to the right fallopian tube, and after confirmation of identification by tracing the tube out to the fimbriae, the same procedure was then performed on with excellent hemostasis noted.  The abdomen and pelvis were cleared of all clot and debris and the Trula Gable was removed. Hemostasis was confirmed on all surfaces.  The peritoneum was reapproximated using 2-0 vicryl . The fascia was then closed using 0-Vicryl in a running fashion. The subcutaneous layer was reapproximated with 2-0 plain gut suture. The skin was closed with a 4-0 vicryl subcuticular stitch. Prevena placed. The patient tolerated the procedure well. Sponge, lap, instrument and needle counts were correct x 2. She was taken to the recovery room in stable condition.  Disposition: PACU - hemodynamically stable.    Signed: Maud Sorenson, MD OB Fellow, [facility] for [facility], [facility]

## 2023-07-15 NOTE — Anesthesia Procedure Notes (Signed)
 Spinal  Patient location during procedure: OR Start time: 07/15/2023 2:43 PM End time: 07/15/2023 2:49 PM Reason for block: surgical anesthesia Staffing Performed: anesthesiologist  Anesthesiologist: Gorman Laughter, MD Performed by: Gorman Laughter, MD Authorized by: Gorman Laughter, MD   Preanesthetic Checklist Completed: patient identified, IV checked, site marked, risks and benefits discussed, surgical consent, monitors and equipment checked, pre-op evaluation and timeout performed Spinal Block Patient position: sitting Prep: DuraPrep and site prepped and draped Patient monitoring: heart rate, continuous pulse ox and blood pressure Approach: midline Location: L3-4 Injection technique: single-shot Needle Needle type: Spinocan  Needle gauge: 25 G Needle length: 9 cm Additional Notes Expiration date of kit checked and confirmed. Patient tolerated procedure well, without complications.

## 2023-07-15 NOTE — H&P (Signed)
 LABOR AND DELIVERY ADMISSION HISTORY AND PHYSICAL NOTE  Allison Friedman is a 28 y.o. female G51P1011 with IUP at [redacted]w[redacted]d presenting for scheduled repeat cesarean section with bilateral tubal ligation.   Patient reports the fetal movement as active. Patient reports uterine contraction  activity as none. Patient reports  vaginal bleeding as none. Patient describes fluid per vagina as None.   She plans on bottle feeding feeding. Her contraception plan is: bilateral tubal ligation.  Prenatal History/Complications: PNC at [facility]  Sono:  @[redacted]w[redacted]d , CWD, normal anatomy, cephalic presentation, posterior placenta, 98%ile, EFW 3679 g  Pregnancy complications:  Patient Active Problem List   Diagnosis Date Noted   History of cesarean delivery 01/28/2023   Supervision of high-risk pregnancy 01/21/2023   Chronic migraine w/o aura w/o status migrainosus, not intractable 06/13/2022   Hyperlipidemia associated with type 2 diabetes mellitus (HCC) 05/22/2022   Fatty liver disease, nonalcoholic 10/25/2021   GERD (gastroesophageal reflux disease) 10/25/2021   Depression, recurrent (HCC) 08/08/2021   Morbid obesity (HCC) 08/08/2021   Type 2 diabetes mellitus 05/09/2021   Anxiety and depression 05/09/2021    Past Medical History: Past Medical History:  Diagnosis Date   Anemia    Anxiety    Childhood asthma    Depression    Diabetes mellitus without complication (HCC)    Elevated lipase 10/25/2021   Migraines    Palpitations     Past Surgical History: Past Surgical History:  Procedure Laterality Date   CESAREAN SECTION N/A 05/08/2021   Procedure: CESAREAN SECTION;  Surgeon: Richard Champion A, DO;  Location: [facility] LD ORS;  Service: Obstetrics;  Laterality: N/A;   CYST REMOVAL NECK     infected sweat gland - cyst removal on the chest   NECK SURGERY      Obstetrical History: OB History     Gravida  3   Para  1   Term  1   Preterm      AB  1   Living  1      SAB  1   IAB       Ectopic      Multiple      Live Births  1           Social History: Social History   Socioeconomic History   Marital status: Married    Spouse name: Bearl Limes   Number of children: 1   Years of education: 12   Highest education level: GED or equivalent  Occupational History   Not on file  Tobacco Use   Smoking status: Never   Smokeless tobacco: Never  Vaping Use   Vaping status: Never Used  Substance and Sexual Activity   Alcohol use: Never   Drug use: Never   Sexual activity: Yes  Other Topics Concern   Not on file  Social History Narrative   Not on file   Social Drivers of Health   Financial Resource Strain: Low Risk  (01/21/2023)   Overall Financial Resource Strain (CARDIA)    Difficulty of Paying Living Expenses: Not hard at all  Food Insecurity: No Food Insecurity (01/21/2023)   Hunger Vital Sign    Worried About Running Out of Food in the Last Year: Never true    Ran Out of Food in the Last Year: Never true  Transportation Needs: No Transportation Needs (01/21/2023)   PRAPARE - Administrator, Civil Service (Medical): No    Lack of Transportation (Non-Medical): No  Physical Activity: Insufficiently  Active (01/21/2023)   Exercise Vital Sign    Days of Exercise per Week: 2 days    Minutes of Exercise per Session: 30 min  Stress: No Stress Concern Present (01/21/2023)   [employer] of Occupational Health - Occupational Stress Questionnaire    Feeling of Stress : Not at all  Social Connections: Moderately Isolated (01/21/2023)   Social Connection and Isolation Panel [NHANES]    Frequency of Communication with Friends and Family: More than three times a week    Frequency of Social Gatherings with Friends and Family: Once a week    Attends Religious Services: Never    Database administrator or Organizations: No    Attends Engineer, structural: Never    Marital Status: Married    Family History: Family History  Problem  Relation Age of Onset   Depression Mother    Arthritis Mother    Anxiety disorder Mother    Diabetes Mother    Hypertension Mother    Diabetes Father    Alcohol abuse Father    Hypertension Father    Congenital Murmur Father    Heart attack Father        late 65's or early 47's   Hearing loss Brother        born deaf   Asthma Brother    Ovarian cancer Maternal Grandmother    Colon cancer Maternal Grandfather     Allergies: Allergies  Allergen Reactions   Metformin  And Related Diarrhea    Intestinal cramps   Prozac [Fluoxetine] Other (See Comments)    Reports suicidal thoughts with prozac or xanax--not willing to risk being on either medication   Xanax [Alprazolam] Other (See Comments)    Reports suicidal thoughts with prozac or xanax--not willing to risk being on either medication     Medications Prior to Admission  Medication Sig Dispense Refill Last Dose/Taking   aspirin  EC 81 MG tablet Take 1 tablet (81 mg total) by mouth daily. Swallow whole. (Patient taking differently: Take 81 mg by mouth once a week. Swallow whole.) 30 tablet 12 Past Week   insulin  lispro (HUMALOG ) 100 UNIT/ML injection Use with Omnipod for TDD around 120 units daily. (Patient taking differently: Use with Omnipod for TDD around 80 units daily.) 40 mL 3 Taking Differently   linaclotide  (LINZESS ) 145 MCG CAPS capsule Take 1 capsule (145 mcg total) by mouth daily before breakfast. (Patient taking differently: Take 145 mcg by mouth daily as needed (constipation).) 30 capsule 5 07/14/2023   ondansetron  (ZOFRAN -ODT) 4 MG disintegrating tablet Take 1-2 tablets (4-8 mg total) by mouth every 6 (six) hours as needed for nausea or vomiting. 20 tablet 0 Past Week   Prenat-FeCbn-FeAsp-Meth-FA-DHA (PRENATE MINI PO) Take 2 capsules by mouth daily.   07/14/2023   Accu-Chek Softclix Lancets lancets Use as instructed to check blood sugar 4 times daily 100 each 12    Continuous Glucose Sensor (DEXCOM G7 SENSOR) MISC Inject 1  Application into the skin as directed. Change sensor every 10 days as directed. 3 each 11    Insulin  Disposable Pump (OMNIPOD DASH INTRO, GEN 4,) KIT Change pod every 72 hours 1 kit 0    Insulin  Disposable Pump (OMNIPOD DASH PODS, GEN 4,) MISC Change pod every 72 hours 10 each 6    Insulin  Syringe-Needle U-100 (INSULIN  SYRINGE .3CC/29GX1/2") 29G X 1/2" 0.3 ML MISC 1 Units by Does not apply route 2 (two) times daily. 200 each 6      Review of  Systems  All systems reviewed and negative except as stated in HPI  Physical Exam BP 131/85   Pulse 85   Temp 98.3 F (36.8 C) (Oral)   Resp 16   Ht 5\' 4"  (1.626 m)   Wt 135.8 kg   LMP 10/09/2022 (Exact Date) Comment: early ultrasound on 12/09/22    size < dates  EDD by early us  = 6-11---25  SpO2 97%   BMI 51.39 kg/m   Physical Exam Constitutional:      General: She is not in acute distress.    Appearance: Normal appearance. She is not ill-appearing.  HENT:     Head: Atraumatic.  Eyes:     General: No scleral icterus.    Conjunctiva/sclera: Conjunctivae normal.  Pulmonary:     Effort: Pulmonary effort is normal.  Skin:    General: Skin is warm and dry.     Coloration: Skin is not jaundiced or pale.  Neurological:     Mental Status: She is alert.     Coordination: Coordination normal.  Psychiatric:        Mood and Affect: Mood normal.        Behavior: Behavior normal.     FHR: 141 bpm by doppler  Prenatal labs: ABO, Rh: --/--/O POS (05/19 1049) Antibody: NEG (05/19 1049) Rubella: 2.75 (11/27 1410) RPR: NON REACTIVE (05/19 1114)  HBsAg: Negative (11/27 1410)  HIV: Non Reactive (03/25 1210)  GC/Chlamydia:  Neisseria Gonorrhea  Date Value Ref Range Status  07/10/2023 Negative  Final   Chlamydia  Date Value Ref Range Status  07/10/2023 Negative  Final   GBS: Negative/-- (05/16 1413)  Prenatal Transfer Tool  Maternal Diabetes: Yes:  Diabetes Type:  Pre-pregnancy, Insulin /Medication controlled Genetic Screening:  Normal Maternal Ultrasounds/Referrals: Normal Fetal Ultrasounds or other Referrals:  Fetal echo, normal Maternal Substance Abuse:  No Significant Maternal Medications:  Meds include: Other: insulin  Significant Maternal Lab Results: Group B Strep negative  Results for orders placed or performed during the hospital encounter of 07/15/23 (from the past 24 hours)  Glucose, capillary   Collection Time: 07/15/23 12:56 PM  Result Value Ref Range   Glucose-Capillary 103 (H) 70 - 99 mg/dL    Assessment: Allison Friedman is a 28 y.o. G3P1011 at [redacted]w[redacted]d here for scheduled cesarean section.  #Repeat Low Transverse Cesarean and Bilateral Tubal Ligation via Bilateral salpingectomy  The risks of cesarean section were discussed with the patient including but were not limited to: bleeding which may require transfusion or reoperation; infection which may require antibiotics; injury to bowel, bladder, ureters or other surrounding organs; injury to the fetus; need for additional procedures including hysterectomy in the event of a life-threatening hemorrhage; placental abnormalities wth subsequent pregnancies, incisional problems, thromboembolic phenomenon and other postoperative/anesthesia complications.  Patient also desires permanent sterilization.  Other reversible forms of contraception were discussed with patient; she declines all other modalities. Risks of procedure discussed with patient including but not limited to: risk of regret, permanence of method, bleeding, infection, injury to surrounding organs and need for additional procedures.  Failure risk of about 1% with increased risk of ectopic gestation if pregnancy occurs was also discussed with patient.  Also discussed possibility of post-tubal pain syndrome. The patient concurred with the proposed plan, giving informed written consent for the procedures.  Patient has been NPO since last night she will remain NPO for procedure. Anesthesia and OR aware.   Preoperative prophylactic antibiotics and SCDs ordered on call to the OR.   #Anesthesia: spinal #  FWB: FHR 141 bpm by doppler #GBS/ID: Negative #MOF: bottle feeding #MOC: bilateral tubal ligation #Circ: Yes  #T2DM: currently on omnipod, plan to manage per IP DM coordinator recs  Teena Feast, MD/MPH Attending Family Medicine Physician, [facility] for [facility], [facility]  07/15/2023, 2:16 PM

## 2023-07-15 NOTE — OR Nursing (Signed)
 1432 pt turned off insulin  pump  as per recommendation of diabetic coordinator .

## 2023-07-16 ENCOUNTER — Other Ambulatory Visit: Payer: Self-pay

## 2023-07-16 LAB — CBC
HCT: 32.9 % — ABNORMAL LOW (ref 36.0–46.0)
Hemoglobin: 10.9 g/dL — ABNORMAL LOW (ref 12.0–15.0)
MCH: 28.4 pg (ref 26.0–34.0)
MCHC: 33.1 g/dL (ref 30.0–36.0)
MCV: 85.7 fL (ref 80.0–100.0)
Platelets: 139 10*3/uL — ABNORMAL LOW (ref 150–400)
RBC: 3.84 MIL/uL — ABNORMAL LOW (ref 3.87–5.11)
RDW: 14.6 % (ref 11.5–15.5)
WBC: 7.4 10*3/uL (ref 4.0–10.5)
nRBC: 0 % (ref 0.0–0.2)

## 2023-07-16 MED ORDER — LINACLOTIDE 145 MCG PO CAPS
145.0000 ug | ORAL_CAPSULE | Freq: Every day | ORAL | Status: DC | PRN
  Filled 2023-07-16: qty 1

## 2023-07-16 MED ORDER — LACTATED RINGERS IV BOLUS
500.0000 mL | Freq: Once | INTRAVENOUS | Status: AC
  Administered 2023-07-16: 500 mL via INTRAVENOUS

## 2023-07-16 NOTE — Progress Notes (Signed)
 Subjective: Postpartum Day #1: Cesarean Delivery & BTS Patient reports tolerating PO and no problems voiding; bottlefeeding; denies dizziness w ambulation; she desires a circ for her son- she was consented and a note placed in his chart   Objective: Vital signs in last 24 hours: Temp:  [97.1 F (36.2 C)-98.3 F (36.8 C)] 97.6 F (36.4 C) (05/22 0528) Pulse Rate:  [58-85] 74 (05/22 0528) Resp:  [12-19] 18 (05/22 0528) BP: (77-131)/(40-85) 103/66 (05/22 0528) SpO2:  [90 %-100 %] 97 % (05/22 0528) Weight:  [135.8 kg] 135.8 kg (05/21 1313)  Physical Exam:  General: alert, cooperative, and no distress Lochia: appropriate Uterine Fundus: firm Incision: Prevena intact DVT Evaluation: No evidence of DVT seen on physical exam.  Recent Labs    07/13/23 1113 07/16/23 0550  HGB 12.1 10.9*  HCT 37.3 32.9*    Assessment/Plan: Status post Cesarean section. Doing well postoperatively. Inpt DM coordinator to follow, and orders for CBG values from Dexcom to be entered in lab section. Anticipate d/c 5/23 or 5/24.  Jolayne Natter, CNM 07/16/2023, 9:17 AM

## 2023-07-16 NOTE — Progress Notes (Signed)
 MOB was referred for history of depression/anxiety.  * Referral screened out by Clinical Social Worker because none of the following criteria appear to apply:  ~ History of anxiety/depression during this pregnancy, or of post-partum depression following prior delivery.  ~ Diagnosis of anxiety and/or depression within last 3 years  Per OB notes, MOB did not indicate any signs/symptoms during her pregnancy.   Per chart review, anxiety/depression 2019  OR  * MOB's symptoms currently being treated with medication and/or therapy.  Please contact the Clinical Social Worker if needs arise, by [facility] request, or if MOB scores greater than 9/yes to question 10 on Edinburgh Postpartum Depression Screen.  Jenney Modest, Milinda Allen Clinical Social Worker [phone]

## 2023-07-17 ENCOUNTER — Encounter: Admitting: Obstetrics & Gynecology

## 2023-07-17 ENCOUNTER — Other Ambulatory Visit: Payer: BC Managed Care – PPO

## 2023-07-17 LAB — BIRTH TISSUE RECOVERY COLLECTION (PLACENTA DONATION)

## 2023-07-17 LAB — SURGICAL PATHOLOGY

## 2023-07-17 MED ORDER — INSULIN LISPRO 100 UNIT/ML IJ SOLN
INTRAMUSCULAR | 3 refills | Status: DC
Start: 2023-07-17 — End: 2023-11-13

## 2023-07-17 MED ORDER — IBUPROFEN 800 MG PO TABS: 800.0000 mg | ORAL_TABLET | Freq: Three times a day (TID) | ORAL | 0 refills | Status: DC

## 2023-07-17 MED ORDER — POTASSIUM CHLORIDE CRYS ER 20 MEQ PO TBCR: 20.0000 meq | EXTENDED_RELEASE_TABLET | Freq: Every day | ORAL | 0 refills | Status: DC

## 2023-07-17 MED ORDER — KETOROLAC TROMETHAMINE 10 MG PO TABS: 10.0000 mg | ORAL_TABLET | Freq: Four times a day (QID) | ORAL | 0 refills | Status: DC | PRN

## 2023-07-17 MED ORDER — ACETAMINOPHEN 500 MG PO TABS: 1000.0000 mg | ORAL_TABLET | Freq: Three times a day (TID) | ORAL | 0 refills | Status: DC

## 2023-07-17 MED ORDER — GABAPENTIN 100 MG PO CAPS: 100.0000 mg | ORAL_CAPSULE | Freq: Three times a day (TID) | ORAL | 0 refills | Status: DC

## 2023-07-17 MED ORDER — FUROSEMIDE 20 MG PO TABS: 20.0000 mg | ORAL_TABLET | Freq: Every day | ORAL | 0 refills | Status: DC

## 2023-07-17 NOTE — Progress Notes (Signed)
   07/17/23 0600  Point of Care Tests  CBG -Manual entry (!) 144   Mom reported she had just ate breakfast

## 2023-07-21 ENCOUNTER — Other Ambulatory Visit: Payer: BC Managed Care – PPO

## 2023-07-23 ENCOUNTER — Ambulatory Visit (INDEPENDENT_AMBULATORY_CARE_PROVIDER_SITE_OTHER): Admitting: Obstetrics & Gynecology

## 2023-07-23 ENCOUNTER — Encounter: Payer: Self-pay | Admitting: Obstetrics & Gynecology

## 2023-07-23 VITALS — BP 117/82 | Ht 64.0 in | Wt 277.0 lb

## 2023-07-23 DIAGNOSIS — G5603 Carpal tunnel syndrome, bilateral upper limbs: Secondary | ICD-10-CM

## 2023-07-23 DIAGNOSIS — Z9889 Other specified postprocedural states: Secondary | ICD-10-CM

## 2023-07-23 NOTE — Progress Notes (Signed)
  HPI: Patient returns for routine postoperative follow-up having undergone rLTCS + BTL on 07/15/23.  The patient's immediate postoperative recovery has been unremarkable. Since hospital discharge the patient reports no problems.   Current Outpatient Medications: ibuprofen  (ADVIL ) 800 MG tablet, Take 1 tablet (800 mg total) by mouth every 8 (eight) hours. Do not take toradol  within 12 hours, Disp: 30 tablet, Rfl: 0 acetaminophen  (TYLENOL ) 500 MG tablet, Take 2 tablets (1,000 mg total) by mouth every 8 (eight) hours. (Patient not taking: Reported on 07/23/2023), Disp: 30 tablet, Rfl: 0 Continuous Glucose Sensor (DEXCOM G7 SENSOR) MISC, Inject 1 Application into the skin as directed. Change sensor every 10 days as directed. (Patient not taking: Reported on 07/23/2023), Disp: 3 each, Rfl: 11 furosemide (LASIX) 20 MG tablet, Take 1 tablet (20 mg total) by mouth daily. (Patient not taking: Reported on 07/23/2023), Disp: 5 tablet, Rfl: 0 gabapentin (NEURONTIN) 100 MG capsule, Take 1 capsule (100 mg total) by mouth 3 (three) times daily for 7 days. (Patient not taking: Reported on 07/23/2023), Disp: 21 capsule, Rfl: 0 Insulin  Disposable Pump (OMNIPOD DASH INTRO, GEN 4,) KIT, Change pod every 72 hours (Patient not taking: Reported on 07/23/2023), Disp: 1 kit, Rfl: 0 Insulin  Disposable Pump (OMNIPOD DASH PODS, GEN 4,) MISC, Change pod every 72 hours (Patient not taking: Reported on 07/23/2023), Disp: 10 each, Rfl: 6 insulin  lispro (HUMALOG ) 100 UNIT/ML injection, Use with Omnipod for TDD around 80 units daily. (Patient not taking: Reported on 07/23/2023), Disp: 40 mL, Rfl: 3 Insulin  Syringe-Needle U-100 (INSULIN  SYRINGE .3CC/29GX1/2") 29G X 1/2" 0.3 ML MISC, 1 Units by Does not apply route 2 (two) times daily. (Patient not taking: Reported on 07/23/2023), Disp: 200 each, Rfl: 6 ketorolac  (TORADOL ) 10 MG tablet, Take 1 tablet (10 mg total) by mouth every 6 (six) hours as needed. Do not take ibuprofen  within 12 hours  (Patient not taking: Reported on 07/23/2023), Disp: 20 tablet, Rfl: 0 potassium chloride SA (KLOR-CON M) 20 MEQ tablet, Take 1 tablet (20 mEq total) by mouth daily. While taking the fluid pill (Patient not taking: Reported on 07/23/2023), Disp: 5 tablet, Rfl: 0 Prenat-FeCbn-FeAsp-Meth-FA-DHA (PRENATE MINI PO), Take 2 capsules by mouth daily. (Patient not taking: Reported on 07/23/2023), Disp: , Rfl:   No current facility-administered medications for this visit.    Blood pressure 117/82, height 5\' 4"  (1.626 m), weight 277 lb (125.6 kg), last menstrual period 10/09/2022, unknown if currently breastfeeding.  Physical Exam: Incision clean dry intact, could not remove the whole prevena, pt will do it at home  Diagnostic Tests:   Pathology:   Impression + Management plan:   ICD-10-CM   1. Post-operative state: rLTCS + BTL 07/15/23  Z98.890     2. Bilateral carpal tunnel syndrome  G56.03           Medications Prescribed this encounter: No orders of the defined types were placed in this encounter.     Follow up: No follow-ups on file.    Wendelyn Halter, MD Attending Physician for the [facility] for [facility] 07/23/2023 3:07 PM

## 2023-07-24 ENCOUNTER — Encounter: Admitting: Obstetrics & Gynecology

## 2023-07-24 ENCOUNTER — Other Ambulatory Visit: Payer: BC Managed Care – PPO

## 2023-07-27 ENCOUNTER — Encounter: Payer: Self-pay | Admitting: Nurse Practitioner

## 2023-07-28 ENCOUNTER — Other Ambulatory Visit: Payer: BC Managed Care – PPO

## 2023-07-28 MED ORDER — OZEMPIC (0.25 OR 0.5 MG/DOSE) 2 MG/3ML ~~LOC~~ SOPN: PEN_INJECTOR | SUBCUTANEOUS | 3 refills | Status: DC

## 2023-07-29 ENCOUNTER — Other Ambulatory Visit (HOSPITAL_COMMUNITY): Payer: Self-pay

## 2023-07-31 ENCOUNTER — Other Ambulatory Visit: Payer: BC Managed Care – PPO

## 2023-08-03 ENCOUNTER — Telehealth: Payer: Self-pay | Admitting: Nurse Practitioner

## 2023-08-03 ENCOUNTER — Telehealth: Payer: Self-pay

## 2023-08-03 ENCOUNTER — Other Ambulatory Visit (HOSPITAL_COMMUNITY): Payer: Self-pay

## 2023-08-03 ENCOUNTER — Encounter: Payer: Self-pay | Admitting: Nurse Practitioner

## 2023-08-03 NOTE — Telephone Encounter (Signed)
 Pt is checking on PA for Ozempic 

## 2023-08-03 NOTE — Telephone Encounter (Signed)
 Addressed in new encounter.

## 2023-08-03 NOTE — Telephone Encounter (Signed)
 Pharmacy Patient Advocate Encounter   Received notification from Patient Advice Request messages that prior authorization for Ozempic  is required/requested.   Insurance verification completed.   The patient is insured through [employer] .   Per test claim: PA required; PA submitted to above mentioned insurance via CoverMyMeds Key/confirmation #/EOC [id number] Status is pending

## 2023-08-04 ENCOUNTER — Other Ambulatory Visit: Payer: BC Managed Care – PPO

## 2023-08-11 ENCOUNTER — Encounter: Payer: Self-pay | Admitting: Obstetrics & Gynecology

## 2023-08-18 NOTE — Telephone Encounter (Signed)
 Pharmacy Patient Advocate Encounter  Received notification from [facility] that Prior Authorization for Ozempic  (0.25 or 0.5 MG/DOSE) 2MG /3ML pen-injectors has been APPROVED from 08-07-2023 to 08-06-2024   PA #/Case ID/Reference #: [id number]

## 2023-08-21 NOTE — Telephone Encounter (Signed)
Patient was called and a message was left on her voicemail. 

## 2023-08-24 ENCOUNTER — Encounter: Payer: Self-pay | Admitting: Obstetrics & Gynecology

## 2023-08-24 ENCOUNTER — Ambulatory Visit (INDEPENDENT_AMBULATORY_CARE_PROVIDER_SITE_OTHER): Admitting: Obstetrics & Gynecology

## 2023-08-24 VITALS — BP 109/76 | HR 97 | Wt 276.0 lb

## 2023-08-24 DIAGNOSIS — Z9889 Other specified postprocedural states: Secondary | ICD-10-CM | POA: Diagnosis not present

## 2023-08-24 NOTE — Progress Notes (Signed)
 Subjective:     Allison Friedman is a 28 y.o. female who presents for a postpartum visit. She is 5 weeks postpartum following a low cervical transverse Cesarean section. I have fully reviewed the prenatal and intrapartum course. The delivery was at 37+ gestational weeks. Outcome: repeat cesarean section, low transverse incision. Anesthesia: none. Postpartum course has been unremarkable. Baby's course has been normal. Baby is feeding by bottle feeding. Bleeding staining only. Bowel function is normal. Bladder function is normal. Patient is sexually active. Contraception method is tubal ligation during her cesarean section. Postpartum depression screening: Negative.    Review of Systems Negative   Objective:    BP 109/76 (BP Location: Right Arm, Patient Position: Sitting, Cuff Size: Normal)   Pulse 97   Wt 276 lb (125.2 kg)   LMP 10/09/2022 (Exact Date) Comment: early ultrasound on 12/09/22    size < dates  EDD by early us  = 6-11---25  Breastfeeding No   BMI 47.38 kg/m   General:     Breasts:    Lungs:   Heart:    Abdomen: soft, non-tender; bowel sounds normal; no masses,  no organomegaly and incision is clean dry and intact   Vulva: Normal  Vagina: Normal  Cervix:    Corpus:   Adnexa:    Rectal Exam:         Assessment:    Normal postpartum exam. Pap smear not done at today's visit.   Plan:    1. Contraception: Tubal ligation bilateral salpingectomy 2.  Needs colposcopy at 12 weeks postpartum for atypical squamous cells and positive HPV 3. Follow up in: 7 weeks for colposcopy or as needed.

## 2023-08-26 ENCOUNTER — Ambulatory Visit: Admitting: Obstetrics & Gynecology

## 2023-09-14 ENCOUNTER — Other Ambulatory Visit: Payer: Self-pay | Admitting: Nurse Practitioner

## 2023-09-14 ENCOUNTER — Encounter: Attending: Family Medicine | Admitting: Nutrition

## 2023-09-14 DIAGNOSIS — K760 Fatty (change of) liver, not elsewhere classified: Secondary | ICD-10-CM | POA: Insufficient documentation

## 2023-09-14 DIAGNOSIS — O24119 Pre-existing diabetes mellitus, type 2, in pregnancy, unspecified trimester: Secondary | ICD-10-CM | POA: Insufficient documentation

## 2023-09-14 DIAGNOSIS — E119 Type 2 diabetes mellitus without complications: Secondary | ICD-10-CM

## 2023-09-16 ENCOUNTER — Ambulatory Visit: Admitting: Nurse Practitioner

## 2023-09-16 DIAGNOSIS — E119 Type 2 diabetes mellitus without complications: Secondary | ICD-10-CM

## 2023-10-16 ENCOUNTER — Ambulatory Visit: Admitting: Obstetrics & Gynecology

## 2023-10-16 ENCOUNTER — Encounter: Payer: Self-pay | Admitting: Obstetrics & Gynecology

## 2023-10-16 VITALS — BP 120/83 | Ht 64.0 in | Wt 279.0 lb

## 2023-10-16 DIAGNOSIS — R87810 Cervical high risk human papillomavirus (HPV) DNA test positive: Secondary | ICD-10-CM | POA: Diagnosis not present

## 2023-10-16 DIAGNOSIS — R87610 Atypical squamous cells of undetermined significance on cytologic smear of cervix (ASC-US): Secondary | ICD-10-CM | POA: Diagnosis not present

## 2023-10-16 MED ORDER — LINACLOTIDE 145 MCG PO CAPS: 145.0000 ug | ORAL_CAPSULE | Freq: Every day | ORAL | 0 refills | Status: DC

## 2023-10-16 NOTE — Progress Notes (Signed)
    Colposcopy Procedure Note:    Colposcopy Procedure Note  Indications:  ASCUS + HPV 18   2019 ASCCP recommendation:  Smoker:  No. New sexual partner:  No.  :  History of abnormal Pap: yes  Procedure Details  The risks and benefits of the procedure and Written informed consent obtained.  Speculum placed in vagina and excellent visualization of cervix achieved, cervix swabbed x 3 with acetic acid solution.  Findings: Adequate colposcopy is noted today.  Cervix: no visible lesions, no mosaicism, no punctation, and no abnormal vasculature; SCJ visualized 360 degrees without lesions and no biopsies taken. Vaginal inspection: vaginal colposcopy not performed. Vulvar colposcopy: vulvar colposcopy not performed.  Specimens: none  Complications: none.  Colposcopic Impression: Normal colposcopy  Plan(Based on 2019 ASCCP recommendations)  Repeat HPV based cytology 1 year

## 2023-10-28 DIAGNOSIS — R300 Dysuria: Secondary | ICD-10-CM | POA: Diagnosis not present

## 2023-10-28 DIAGNOSIS — R350 Frequency of micturition: Secondary | ICD-10-CM | POA: Diagnosis not present

## 2023-10-28 DIAGNOSIS — E119 Type 2 diabetes mellitus without complications: Secondary | ICD-10-CM | POA: Diagnosis not present

## 2023-10-28 DIAGNOSIS — N3001 Acute cystitis with hematuria: Secondary | ICD-10-CM | POA: Diagnosis not present

## 2023-11-12 ENCOUNTER — Other Ambulatory Visit: Payer: Self-pay | Admitting: Obstetrics & Gynecology

## 2023-11-13 ENCOUNTER — Ambulatory Visit: Admitting: Family Medicine

## 2023-11-13 ENCOUNTER — Encounter: Payer: Self-pay | Admitting: Family Medicine

## 2023-11-13 VITALS — BP 106/69 | HR 83 | Temp 97.7°F | Ht 64.0 in | Wt 276.0 lb

## 2023-11-13 DIAGNOSIS — Z23 Encounter for immunization: Secondary | ICD-10-CM | POA: Diagnosis not present

## 2023-11-13 DIAGNOSIS — Z7985 Long-term (current) use of injectable non-insulin antidiabetic drugs: Secondary | ICD-10-CM

## 2023-11-13 DIAGNOSIS — Z87440 Personal history of urinary (tract) infections: Secondary | ICD-10-CM | POA: Diagnosis not present

## 2023-11-13 DIAGNOSIS — R300 Dysuria: Secondary | ICD-10-CM | POA: Diagnosis not present

## 2023-11-13 DIAGNOSIS — N898 Other specified noninflammatory disorders of vagina: Secondary | ICD-10-CM

## 2023-11-13 DIAGNOSIS — E1165 Type 2 diabetes mellitus with hyperglycemia: Secondary | ICD-10-CM

## 2023-11-13 LAB — WET PREP FOR TRICH, YEAST, CLUE
Clue Cell Exam: NEGATIVE
Trichomonas Exam: NEGATIVE
Yeast Exam: NEGATIVE

## 2023-11-13 LAB — MICROSCOPIC EXAMINATION: RBC, Urine: NONE SEEN /HPF (ref 0–2)

## 2023-11-13 LAB — URINALYSIS, ROUTINE W REFLEX MICROSCOPIC
Bilirubin, UA: NEGATIVE
Glucose, UA: NEGATIVE
Ketones, UA: NEGATIVE
Nitrite, UA: NEGATIVE
Protein,UA: NEGATIVE
RBC, UA: NEGATIVE
Specific Gravity, UA: 1.025 (ref 1.005–1.030)
Urobilinogen, Ur: 0.2 mg/dL (ref 0.2–1.0)
pH, UA: 6 (ref 5.0–7.5)

## 2023-11-13 NOTE — Progress Notes (Signed)
 Acute Office Visit  Subjective:     Patient ID: Allison Friedman, female    DOB: 10-Jun-1995, 28 y.o.   MRN: [id number]  Chief Complaint  Patient presents with   Dysuria    Has had symptoms since Sept 3 and has been treated but not resolved    HPI  History of Present Illness   Allison Friedman is a 28 year old female who presents with persistent urinary symptoms following recent UTI treatment. She is accompanied by her child, Allison Friedman.  Persistent dysuria and urinary symptoms - Persistent discomfort following recent UTI treatment with Macrobid, started September 3rd - Sensation of 'pushing' during urination, partially alleviated by Pyridium - Itching and burning localized to the external urethral area, especially during urination, described as 'scraping' sensation - Increased urinary frequency and urgency - Sharp pain during urination, even when symptoms are not at peak  Vaginal discharge - Increased vaginal discharge since UTI treatment - Discharge is white, thick - Harsh odor present  Medication management - Currently taking Ozempic  - Desires to increase Ozempic  dose but has been unable to reach her endocrinologist       ROS As per HPI.     Objective:    BP 106/69   Pulse 83   Temp 97.7 F (36.5 C) (Temporal)   Ht 5' 4 (1.626 m)   Wt 276 lb (125.2 kg)   LMP 09/28/2023 (Approximate)   SpO2 96%   BMI 47.38 kg/m     Physical Exam Vitals and nursing note reviewed.  Constitutional:      General: She is not in acute distress.    Appearance: She is obese. She is not ill-appearing, toxic-appearing or diaphoretic.  Musculoskeletal:     Right lower leg: No edema.     Left lower leg: No edema.  Skin:    General: Skin is warm and dry.  Neurological:     General: No focal deficit present.     Mental Status: She is alert and oriented to person, place, and time.  Psychiatric:        Mood and Affect: Mood normal.        Behavior: Behavior normal.     Urine  dipstick shows positive for leukocytes.  Micro exam: 0-5 WBC's per HPF, 0 RBC's per HPF, and few + bacteria.  Microscopic wet-mount exam shows negative for pathogens, normal epithelial cells.     Assessment & Plan:   Allison Friedman was seen today for dysuria.  Diagnoses and all orders for this visit:  Dysuria -     Urinalysis, Routine w reflex microscopic -     Urine Culture -     WET PREP FOR TRICH, YEAST, CLUE -     Microscopic Examination  Vaginal odor -     WET PREP FOR TRICH, YEAST, CLUE  Type 2 diabetes mellitus with hyperglycemia, without long-term current use of insulin  (HCC)  Encounter for immunization -     Flu vaccine trivalent PF, 6mos and older(Flulaval,Afluria,Fluarix,Fluzone)   Dysuria and urinary frequency UA is not compelling for UTI today. Urine culture pending. Discussed follow up with GYN if negative urine culture and symptoms persist.  - Send urine for culture. - Refer to GYN if culture negative and symptoms persist.  Vaginal odor Wet prep negative for bacterial vaginosis or yeast infection. Advised on vaginal hygiene.   T2DM Desires dosage increase for ozempic . Managed by endocrinologist.  - Contact endocrinologist for dosage management.     Return to office  for new or worsening symptoms, or if symptoms persist.   The patient indicates understanding of these issues and agrees with the plan.  Allison CHRISTELLA Search, FNP

## 2023-11-15 LAB — URINE CULTURE

## 2023-11-16 ENCOUNTER — Ambulatory Visit: Payer: Self-pay | Admitting: Family Medicine

## 2023-12-11 ENCOUNTER — Other Ambulatory Visit: Payer: Self-pay | Admitting: Nurse Practitioner

## 2023-12-17 ENCOUNTER — Telehealth: Payer: Self-pay | Admitting: Pharmacy Technician

## 2023-12-17 NOTE — Telephone Encounter (Signed)
 Pharmacy Patient Advocate Encounter   Received notification from CoverMyMeds that prior authorization for Dexcom G7 Sensor is due for renewal.   Insurance verification completed.   The patient is insured through [employer].  Action: Medication has been discontinued. Archived Key: [id number]

## 2024-01-14 ENCOUNTER — Ambulatory Visit: Admitting: Nurse Practitioner

## 2024-01-14 ENCOUNTER — Encounter: Payer: Self-pay | Admitting: Nurse Practitioner

## 2024-01-14 VITALS — BP 112/70 | HR 68 | Ht 64.0 in | Wt 284.8 lb

## 2024-01-14 DIAGNOSIS — Z7985 Long-term (current) use of injectable non-insulin antidiabetic drugs: Secondary | ICD-10-CM | POA: Diagnosis not present

## 2024-01-14 DIAGNOSIS — E119 Type 2 diabetes mellitus without complications: Secondary | ICD-10-CM | POA: Diagnosis not present

## 2024-01-14 LAB — POCT GLYCOSYLATED HEMOGLOBIN (HGB A1C): Hemoglobin A1C: 6.9 % — AB (ref 4.0–5.6)

## 2024-01-14 LAB — POCT UA - MICROALBUMIN
Albumin/Creatinine Ratio, Urine, POC: <30
Creatinine, POC: 300 mg/dL
Microalbumin Ur, POC: 10 mg/L

## 2024-01-14 MED ORDER — SEMAGLUTIDE (1 MG/DOSE) 4 MG/3ML ~~LOC~~ SOPN: 1.0000 mg | PEN_INJECTOR | SUBCUTANEOUS | 1 refills | Status: AC

## 2024-01-14 NOTE — Progress Notes (Signed)
 Endocrinology Follow Up Note       01/14/2024, 11:41 AM   Subjective:    Patient ID: Allison Friedman, female    DOB: [birth year].  Allison Friedman is being seen in follow up after being seen in consultation for management of currently controlled symptomatic diabetes requested by  Joesph Annabella HERO, FNP.   Past Medical History:  Diagnosis Date   Anemia    Anxiety    Childhood asthma    Depression    Diabetes mellitus without complication (HCC)    Elevated lipase 10/25/2021   Migraines    Palpitations     Past Surgical History:  Procedure Laterality Date   CESAREAN SECTION N/A 05/08/2021   Procedure: CESAREAN SECTION;  Surgeon: Rendell Calton LABOR, DO;  Location: [facility];  Service: Obstetrics;  Laterality: N/A;   CESAREAN SECTION WITH BILATERAL TUBAL LIGATION Bilateral 07/15/2023   Procedure: CESAREAN SECTION, WITH BILATERAL TUBAL LIGATION;  Surgeon: Lola Donnice HERO, MD;  Location: [facility];  Service: Obstetrics;  Laterality: Bilateral;   CYST REMOVAL NECK     infected sweat gland - cyst removal on the chest   NECK SURGERY      Social History   Socioeconomic History   Marital status: Married    Spouse name: Toribio   Number of children: 1   Years of education: 12   Highest education level: GED or equivalent  Occupational History   Not on file  Tobacco Use   Smoking status: Never   Smokeless tobacco: Never  Vaping Use   Vaping status: Never Used  Substance and Sexual Activity   Alcohol use: Never   Drug use: Never   Sexual activity: Not Currently    Birth control/protection: Surgical    Comment: tubal  Other Topics Concern   Not on file  Social History Narrative   Not on file   Social Drivers of Health   Financial Resource Strain: Low Risk  (01/21/2023)   Overall Financial Resource Strain (CARDIA)    Difficulty of Paying Living Expenses: Not hard at all  Food Insecurity: No Food  Insecurity (01/21/2023)   Hunger Vital Sign    Worried About Running Out of Food in the Last Year: Never true    Ran Out of Food in the Last Year: Never true  Transportation Needs: No Transportation Needs (01/21/2023)   PRAPARE - Administrator, Civil Service (Medical): No    Lack of Transportation (Non-Medical): No  Physical Activity: Insufficiently Active (01/21/2023)   Exercise Vital Sign    Days of Exercise per Week: 2 days    Minutes of Exercise per Session: 30 min  Stress: No Stress Concern Present (01/21/2023)   [employer] of Occupational Health - Occupational Stress Questionnaire    Feeling of Stress : Not at all  Social Connections: Moderately Isolated (01/21/2023)   Social Connection and Isolation Panel    Frequency of Communication with Friends and Family: More than three times a week    Frequency of Social Gatherings with Friends and Family: Once a week    Attends Religious Services: Never    Database Administrator or Organizations: No  Attends Banker Meetings: Never    Marital Status: Married    Family History  Problem Relation Age of Onset   Depression Mother    Arthritis Mother    Anxiety disorder Mother    Diabetes Mother    Hypertension Mother    Diabetes Father    Alcohol abuse Father    Hypertension Father    Congenital Murmur Father    Heart attack Father        late 70's or early 55's   Hearing loss Brother        born deaf   Asthma Brother    Ovarian cancer Maternal Grandmother    Colon cancer Maternal Grandfather     Outpatient Encounter Medications as of 01/14/2024  Medication Sig   Semaglutide , 1 MG/DOSE, 4 MG/3ML SOPN Inject 1 mg as directed once a week.   [DISCONTINUED] Semaglutide ,0.25 or 0.5MG /DOS, (OZEMPIC , 0.25 OR 0.5 MG/DOSE,) 2 MG/3ML SOPN INJECT 0.25 MG SUBCUTANEOUSLY ONCE A WEEK FOR 4 WEEKS, THEN INCREASE TO 0.5 MG WEEKLY THEREAFTER   linaclotide  (LINZESS ) 145 MCG CAPS capsule TAKE 1 CAPSULE BY MOUTH  ONCE DAILY BEFORE BREAKFAST   No facility-administered encounter medications on file as of 01/14/2024.    ALLERGIES: Allergies  Allergen Reactions   Metformin  And Related Diarrhea    Intestinal cramps   Prozac [Fluoxetine] Other (See Comments)    Reports suicidal thoughts with prozac or xanax--not willing to risk being on either medication   Xanax [Alprazolam] Other (See Comments)    Reports suicidal thoughts with prozac or xanax--not willing to risk being on either medication     VACCINATION STATUS: Immunization History  Administered Date(s) Administered   Influenza, Seasonal, Injecte, Preservative Fre 11/13/2023   Influenza,inj,Quad PF,6+ Mos 11/13/2021   Influenza-Unspecified 12/05/2020   Moderna Sars-Covid-2 Vaccination 06/07/2019, 06/28/2019   PFIZER Comirnaty(Gray Top)Covid-19 Tri-Sucrose Vaccine 06/02/2019, 06/27/2019    Diabetes She presents for her follow-up diabetic visit. She has type 2 diabetes mellitus. Onset time: diagnosed at a approx age of 28. Her disease course has been stable. There are no hypoglycemic associated symptoms. There are no diabetic associated symptoms. There are no hypoglycemic complications. There are no diabetic complications. Risk factors for coronary artery disease include diabetes mellitus, family history, obesity and dyslipidemia. Current diabetic treatments: Ozempic  only. She is compliant with treatment most of the time. Her weight is fluctuating minimally. She is following a generally healthy diet. When asked about meal planning, she reported none. She has not had a previous visit with a dietitian. She participates in exercise intermittently. Her overall blood glucose range is 110-130 mg/dl. (She presents today with no meter or logs to review.  She was not asked to routinely monitor glucose due to safe regimen with Ozempic .  Her POCT A1c today is 6.9%, increasing from last A1c of 6%.  She notes she is ready to increase her dose of Ozempic .) An ACE  inhibitor/angiotensin II receptor blocker is not being taken. She does not see a podiatrist.Eye exam is current.     Review of systems  Constitutional: + increasing body weight,  current Body mass index is 48.89 kg/m. , no fatigue, no subjective hyperthermia, no subjective hypothermia Eyes: no blurry vision, no xerophthalmia ENT: no sore throat, no nodules palpated in throat, no dysphagia/odynophagia, no hoarseness Cardiovascular: no chest pain, no shortness of breath, no palpitations, no leg swelling Respiratory: no cough, no shortness of breath Gastrointestinal: no nausea/vomiting/diarrhea Musculoskeletal: no muscle/joint aches Skin: no rashes, no hyperemia Neurological: no  tremors, no numbness, no tingling, no dizziness Psychiatric: no depression, no anxiety  Objective:     BP 112/70 (BP Location: Left Arm, Patient Position: Sitting, Cuff Size: Large)   Pulse 68   Ht 5' 4 (1.626 m)   Wt 284 lb 12.8 oz (129.2 kg)   Breastfeeding No   BMI 48.89 kg/m   Wt Readings from Last 3 Encounters:  01/14/24 284 lb 12.8 oz (129.2 kg)  11/13/23 276 lb (125.2 kg)  10/16/23 279 lb (126.6 kg)     BP Readings from Last 3 Encounters:  01/14/24 112/70  11/13/23 106/69  10/16/23 120/83       Physical Exam- Limited  Constitutional:  Body mass index is 48.89 kg/m. , not in acute distress, normal state of mind Eyes:  EOMI, no exophthalmos Musculoskeletal: no gross deformities, strength intact in all four extremities, no gross restriction of joint movements Skin:  no rashes, no hyperemia Neurological: no tremor with outstretched hands   Diabetic Foot Exam - Simple   Simple Foot Form Diabetic Foot exam was performed with the following findings: Yes 01/14/2024 11:12 AM  Visual Inspection No deformities, no ulcerations, no other skin breakdown bilaterally: Yes Sensation Testing Intact to touch and monofilament testing bilaterally: Yes Pulse Check Posterior Tibialis and Dorsalis  pulse intact bilaterally: Yes Comments      CMP ( most recent) CMP     Component Value Date/Time   NA 134 (L) 05/31/2023 0028   NA 137 05/19/2023 1209   K 3.9 05/31/2023 0028   CL 107 05/31/2023 0028   CO2 18 (L) 05/31/2023 0028   GLUCOSE 95 05/31/2023 0028   BUN 10 05/31/2023 0028   BUN 8 05/19/2023 1209   CREATININE 0.53 05/31/2023 0028   CALCIUM  8.7 (L) 05/31/2023 0028   PROT 6.5 05/31/2023 0028   PROT 6.7 05/19/2023 1209   ALBUMIN 2.9 (L) 05/31/2023 0028   ALBUMIN 3.9 (L) 05/19/2023 1209   AST 11 06/11/2023 1007   ALT 6 06/11/2023 1007   ALKPHOS 50 05/31/2023 0028   BILITOT 0.4 05/31/2023 0028   BILITOT <0.2 05/19/2023 1209   EGFR 134 05/19/2023 1209   GFRNONAA >60 05/31/2023 0028     Diabetic Labs (most recent): Lab Results  Component Value Date   HGBA1C 6.9 (A) 01/14/2024   HGBA1C 6.0 (A) 04/03/2023   HGBA1C 6.5 (A) 01/02/2023   MICROALBUR 10 mg/L 01/14/2024     Lipid Panel ( most recent) Lipid Panel     Component Value Date/Time   CHOL 158 02/20/2022 1050   TRIG 188 (H) 02/20/2022 1050   HDL 34 (L) 02/20/2022 1050   CHOLHDL 4.6 (H) 02/20/2022 1050   LDLCALC 92 02/20/2022 1050   LABVLDL 32 02/20/2022 1050      Lab Results  Component Value Date   TSH 0.724 01/21/2023           Assessment & Plan:   1) Type 2 diabetes mellitus without complication, without long-term current use of insulin  (HCC)  She presents today with no meter or logs to review.  She was not asked to routinely monitor glucose due to safe regimen with Ozempic .  Her POCT A1c today is 6.9%, increasing from last A1c of 6%.  She notes she is ready to increase her dose of Ozempic .  - Allison Friedman has currently uncontrolled symptomatic type 2 DM since 28 years of age.   -Recent labs reviewed.  - I had a long discussion with her about the progressive nature  of diabetes and the pathology behind its complications. -her diabetes is not currently complicated but she remains at a  high risk for more acute and chronic complications which include CAD, CVA, CKD, retinopathy, and neuropathy. These are all discussed in detail with her.  The following Lifestyle Medicine recommendations according to American College of Lifestyle Medicine [facility]) were discussed and offered to patient and she agrees to start the journey:  A. Whole Foods, Plant-based plate comprising of fruits and vegetables, plant-based proteins, whole-grain carbohydrates was discussed in detail with the patient.   A list for source of those nutrients were also provided to the patient.  Patient will use only water  or unsweetened tea for hydration. B.  The need to stay away from risky substances including alcohol, smoking; obtaining 7 to 9 hours of restorative sleep, at least 150 minutes of moderate intensity exercise weekly, the importance of healthy social connections,  and stress reduction techniques were discussed. C.  A full color page of  Calorie density of various food groups per pound showing examples of each food groups was provided to the patient.  - Nutritional counseling repeated/built upon at each appointment.  - The patient admits there is a room for improvement in their diet and drink choices. -  Suggestion is made for the patient to avoid simple carbohydrates from their diet including Cakes, Sweet Desserts / Pastries, Ice Cream, Soda (diet and regular), Sweet Tea, Candies, Chips, Cookies, Sweet Pastries, Store Bought Juices, Alcohol in Excess of 1-2 drinks a day, Artificial Sweeteners, Coffee Creamer, and Sugar-free Products. This will help patient to have stable blood glucose profile and potentially avoid unintended weight gain.   - I encouraged the patient to switch to unprocessed or minimally processed complex starch and increased protein intake (animal or plant source), fruits, and vegetables.   - Patient is advised to stick to a routine mealtimes to eat 3 meals a day and avoid unnecessary snacks (to  snack only to correct hypoglycemia).  - I have approached her with the following individualized plan to manage her diabetes and patient agrees:   -Will increase her Ozempic  to 1 mg SQ weekly.  She does not need to routinely monitor glucose at this time.  - Specific targets for  A1c; LDL, HDL, and Triglycerides were discussed with the patient.  3) Blood Pressure /Hypertension:  her blood pressure is controlled to target without the use of antihypertensive medications.    4) Lipids/Hyperlipidemia:    Review of her recent lipid panel from 02/20/22 showed controlled LDL at 92 and elevated triglycerides of 188.  She is not currently on any lipid lowering medications.  Will recheck lipid panel prior to next visit.  5)  Weight/Diet:  her Body mass index is 48.89 kg/m.  -  clearly complicating her diabetes care.   she is a candidate for weight loss. I discussed with her the fact that loss of 5 - 10% of her  current body weight will have the most impact on her diabetes management.  Exercise, and detailed carbohydrates information provided  -  detailed on discharge instructions.  6) Chronic Care/Health Maintenance: -she is not on ACEI/ARB or Statin medications and is encouraged to initiate and continue to follow up with Ophthalmology, Dentist, Podiatrist at least yearly or according to recommendations, and advised to stay away from smoking. I have recommended yearly flu vaccine and pneumonia vaccine at least every 5 years; moderate intensity exercise for up to 150 minutes weekly; and sleep for at least 7  hours a day.  - she is advised to maintain close follow up with Joesph Annabella HERO, FNP for primary care needs, as well as her other providers for optimal and coordinated care.     I spent  25  minutes in the care of the patient today including review of labs from CMP, Lipids, Thyroid  Function, Hematology (current and previous including abstractions from other facilities); face-to-face time discussing   her blood glucose readings/logs, discussing hypoglycemia and hyperglycemia episodes and symptoms, medications doses, her options of short and long term treatment based on the latest standards of care / guidelines;  discussion about incorporating lifestyle medicine;  and documenting the encounter. Risk reduction counseling performed per USPSTF guidelines to reduce obesity and cardiovascular risk factors.     Please refer to Patient Instructions for Blood Glucose Monitoring and Insulin /Medications Dosing Guide  in media tab for additional information. Please  also refer to  Patient Self Inventory in the Media  tab for reviewed elements of pertinent patient history.  Allison Friedman participated in the discussions, expressed understanding, and voiced agreement with the above plans.  All questions were answered to her satisfaction. she is encouraged to contact clinic should she have any questions or concerns prior to her return visit.     Follow up plan: - Return in about 4 months (around 05/13/2024) for Diabetes F/U with A1c in office, No previsit labs.   Benton Rio, [facility] [address] [place], [place] [address] Phone: [phone] Fax: [phone]  01/14/2024, 11:41 AM

## 2024-03-08 ENCOUNTER — Encounter: Payer: Self-pay | Admitting: Family Medicine

## 2024-03-08 ENCOUNTER — Ambulatory Visit: Admitting: Nurse Practitioner

## 2024-03-14 ENCOUNTER — Encounter: Payer: Self-pay | Admitting: Nurse Practitioner

## 2024-03-14 ENCOUNTER — Ambulatory Visit: Admitting: Nurse Practitioner

## 2024-03-14 VITALS — BP 117/78 | HR 88 | Temp 98.7°F | Ht 64.0 in | Wt 279.4 lb

## 2024-03-14 DIAGNOSIS — F419 Anxiety disorder, unspecified: Secondary | ICD-10-CM | POA: Diagnosis not present

## 2024-03-14 DIAGNOSIS — Z7985 Long-term (current) use of injectable non-insulin antidiabetic drugs: Secondary | ICD-10-CM

## 2024-03-14 DIAGNOSIS — F331 Major depressive disorder, recurrent, moderate: Secondary | ICD-10-CM

## 2024-03-14 DIAGNOSIS — E1165 Type 2 diabetes mellitus with hyperglycemia: Secondary | ICD-10-CM

## 2024-03-14 DIAGNOSIS — R1033 Periumbilical pain: Secondary | ICD-10-CM

## 2024-03-14 NOTE — Addendum Note (Signed)
 Addended by: MILAS CONGRESS D on: 03/14/2024 04:07 PM   Modules accepted: Orders

## 2024-03-14 NOTE — Progress Notes (Signed)
 "    Subjective:  Patient ID: Allison Friedman, female    DOB: September 18, 1995, 29 y.o.   MRN: [id number]  Patient Care Team: Joesph Annabella HERO, FNP as PCP - General (Family Medicine) Therisa Benton PARAS, NP as Nurse Practitioner (Nurse Practitioner)   Chief Complaint:  abdominal bump (Near belly button)   HPI: Allison Friedman is a 29 y.o. female presenting on 03/14/2024 for abdominal bump (Near belly button)   Discussed the use of AI scribe software for clinical note transcription with the patient, who gave verbal consent to proceed.  History of Present Illness Allison Friedman is a 29 year old female with type 2 diabetes who presents with tenderness and a lump near her belly button.  She experiences tenderness around her umbilicus, particularly when standing, and describes a palpable lump in the area. The symptoms are more pronounced when she is upright, and she has noticed irritation around the umbilicus.  She has a history of type 2 diabetes and is currently taking Ozempic . She has not seen her primary care physician recently but last visited her diabetic specialist a couple of months ago. She has not had an eye exam in almost three years.  She has a history of clinical depression and anxiety, previously managed with medications such as Zoloft, Prozac, and Xanax, but is not currently on any mental health medication. She last took medication for these conditions a year and a half ago. She wants to return to talk therapy due to increased anxiety and a lack of happiness since having her son seven months ago. She previously attended talk therapy after the birth of her daughter, who is now almost three years old.  Report making great connection with the baby.  denies SI/HI  She has a supportive home environment with her husband, who helps with their two children.       03/14/2024    2:20 PM 11/13/2023    2:52 PM 01/21/2023   10:53 AM  PHQ9 SCORE ONLY  PHQ-9 Total Score 8 6 4       Data saved  with a previous flowsheet row definition       03/14/2024    2:20 PM 11/13/2023    2:53 PM 01/21/2023   10:53 AM 12/03/2022    2:09 PM  GAD 7 : Generalized Anxiety Score  Nervous, Anxious, on Edge 2  2  0  2   Control/stop worrying 1  1  0  1   Worry too much - different things 1  1  0  0   Trouble relaxing 2  0  0  0   Restless 0  0  0  0   Easily annoyed or irritable 2  2  2  2    Afraid - awful might happen 0  0  0  0   Total GAD 7 Score 8 6 2 5   Anxiety Difficulty Not difficult at all Not difficult at all  Not difficult at all     Data saved with a previous flowsheet row definition      Relevant past medical, surgical, family, and social history reviewed and updated as indicated.  Allergies and medications reviewed and updated. Data reviewed: Chart in Epic.   Past Medical History:  Diagnosis Date   Anemia    Anxiety    Childhood asthma    Depression    Diabetes mellitus without complication (HCC)    Elevated lipase 10/25/2021   Migraines    Palpitations  Past Surgical History:  Procedure Laterality Date   CESAREAN SECTION N/A 05/08/2021   Procedure: CESAREAN SECTION;  Surgeon: Rendell Calton LABOR, DO;  Location: [facility];  Service: Obstetrics;  Laterality: N/A;   CESAREAN SECTION WITH BILATERAL TUBAL LIGATION Bilateral 07/15/2023   Procedure: CESAREAN SECTION, WITH BILATERAL TUBAL LIGATION;  Surgeon: Lola Donnice HERO, MD;  Location: [facility];  Service: Obstetrics;  Laterality: Bilateral;   CYST REMOVAL NECK     infected sweat gland - cyst removal on the chest   NECK SURGERY      Social History   Socioeconomic History   Marital status: Married    Spouse name: Toribio   Number of children: 1   Years of education: 12   Highest education level: GED or equivalent  Occupational History   Not on file  Tobacco Use   Smoking status: Never   Smokeless tobacco: Never  Vaping Use   Vaping status: Never Used  Substance and Sexual Activity   Alcohol use: Never    Drug use: Never   Sexual activity: Not Currently    Birth control/protection: Surgical    Comment: tubal  Other Topics Concern   Not on file  Social History Narrative   Not on file   Social Drivers of Health   Tobacco Use: Low Risk (03/14/2024)   Patient History    Smoking Tobacco Use: Never    Smokeless Tobacco Use: Never    Passive Exposure: Not on file  Financial Resource Strain: Low Risk (03/07/2024)   Overall Financial Resource Strain (CARDIA)    Difficulty of Paying Living Expenses: Not hard at all  Food Insecurity: No Food Insecurity (03/07/2024)   Epic    Worried About Programme Researcher, Broadcasting/film/video in the Last Year: Never true    Ran Out of Food in the Last Year: Never true  Transportation Needs: No Transportation Needs (03/07/2024)   Epic    Lack of Transportation (Medical): No    Lack of Transportation (Non-Medical): No  Physical Activity: Insufficiently Active (03/07/2024)   Exercise Vital Sign    Days of Exercise per Week: 1 day    Minutes of Exercise per Session: 10 min  Stress: Stress Concern Present (03/07/2024)   [employer] of Occupational Health - Occupational Stress Questionnaire    Feeling of Stress: To some extent  Social Connections: Unknown (03/07/2024)   Social Connection and Isolation Panel    Frequency of Communication with Friends and Family: Once a week    Frequency of Social Gatherings with Friends and Family: Patient declined    Attends Religious Services: Never    Database Administrator or Organizations: No    Attends Engineer, Structural: Not on file    Marital Status: Married  Catering Manager Violence: Not At Risk (01/21/2023)   Humiliation, Afraid, Rape, and Kick questionnaire    Fear of Current or Ex-Partner: No    Emotionally Abused: No    Physically Abused: No    Sexually Abused: No  Depression (PHQ2-9): Medium Risk (03/14/2024)   Depression (PHQ2-9)    PHQ-2 Score: 8  Alcohol Screen: Low Risk (01/21/2023)   Alcohol Screen     Last Alcohol Screening Score (AUDIT): 0  Housing: Unknown (03/07/2024)   Epic    Unable to Pay for Housing in the Last Year: No    Number of Times Moved in the Last Year: Not on file    Homeless in the Last Year: No  Utilities: Not At  Risk (01/21/2023)   [facility] Utilities    Threatened with loss of utilities: No  Health Literacy: Adequate Health Literacy (01/21/2023)   B1300 Health Literacy    Frequency of need for help with medical instructions: Never    Outpatient Encounter Medications as of 03/14/2024  Medication Sig   linaclotide  (LINZESS ) 145 MCG CAPS capsule TAKE 1 CAPSULE BY MOUTH ONCE DAILY BEFORE BREAKFAST   Semaglutide , 1 MG/DOSE, 4 MG/3ML SOPN Inject 1 mg as directed once a week.   No facility-administered encounter medications on file as of 03/14/2024.    Allergies[1]  Pertinent ROS per HPI, otherwise unremarkable      Objective:  BP 117/78   Pulse 88   Temp 98.7 F (37.1 C)   Ht 5' 4 (1.626 m)   Wt 279 lb 6.4 oz (126.7 kg)   SpO2 97%   BMI 47.96 kg/m    Wt Readings from Last 3 Encounters:  03/14/24 279 lb 6.4 oz (126.7 kg)  01/14/24 284 lb 12.8 oz (129.2 kg)  11/13/23 276 lb (125.2 kg)    Physical Exam Vitals and nursing note reviewed.  Constitutional:      General: She is not in acute distress.    Appearance: She is obese.  HENT:     Head: Normocephalic and atraumatic.     Right Ear: There is no impacted cerumen.  Cardiovascular:     Heart sounds: Normal heart sounds.  Pulmonary:     Effort: Pulmonary effort is normal.     Breath sounds: Normal breath sounds.  Abdominal:     General: Bowel sounds are normal.     Palpations: Abdomen is soft.  Neurological:     Mental Status: She is alert.  Psychiatric:        Mood and Affect: Mood is anxious and depressed.        Speech: Speech normal.        Behavior: Behavior normal. Behavior is cooperative.        Thought Content: Thought content normal. Thought content does not include homicidal or  suicidal ideation. Thought content does not include homicidal or suicidal plan.        Cognition and Memory: Cognition and memory normal.        Judgment: Judgment normal.    Physical Exam ABDOMEN: Deep umbilicus with no redness or drainage. Small lipoma near umbilicus.     Results for orders placed or performed in visit on 01/14/24  POCT UA - Microalbumin   Collection Time: 01/14/24 10:56 AM  Result Value Ref Range   Microalbumin Ur, POC 10 mg/L mg/L   Creatinine, POC 300 mg/dL mg/dL   Albumin/Creatinine Ratio, Urine, POC <30 mg/G   HgB A1c   Collection Time: 01/14/24 10:57 AM  Result Value Ref Range   Hemoglobin A1C 6.9 (A) 4.0 - 5.6 %   HbA1c POC (<> result, manual entry)     HbA1c, POC (prediabetic range)     HbA1c, POC (controlled diabetic range)         Pertinent labs & imaging results that were available during my care of the patient were reviewed by me and considered in my medical decision making.  Assessment & Plan:  Kamalani was seen today for abdominal bump.  Diagnoses and all orders for this visit:  Moderate episode of recurrent major depressive disorder (HCC) -     Ambulatory referral to Psychology  Umbilical pain -     WOUND CULTURE  Anxiety and depression -  Ambulatory referral to Psychology  Type 2 diabetes mellitus with hyperglycemia, without long-term current use of insulin  (HCC)     Assessment and Plan Lucie is a 29 year old female seen today for chronic disease management, no acute distress Assessment & Plan Umbilical tenderness and possible local skin infection Tenderness and irritation around the umbilicus with a palpable lump, likely a lipoma. Deep umbilicus may predispose to moisture retention and potential infection. - Performed wound culture. - Advised to dry umbilicus thoroughly after showering. - Instructed to avoid using peroxide in the umbilicus. - Await culture results to determine need for antibiotics.  Major depressive  disorder and anxiety disorder Clinical depression and anxiety with previous unsuccessful medication trials. Currently not on any mental health medication. Interested in talk therapy and medication management. - Referred to psychologist for talk therapy and potential medication management. - Performed GeneSight test to guide medication selection. - Scheduled follow-up to review test results and discuss medication options.  Type 2 diabetes mellitus with hyperglycemia Type 2 diabetes managed with Ozempic . Due for urinalysis and annual eye exam. - Needs microalbumin, unable to provide sample today - Advised to schedule annual eye exam.    Patient and/or legal guardian verbally consented to [facility] services about presenting concerns and psychiatric consultation as appropriate.  The services will be billed as appropriate for the patient   Continue all other maintenance medications.  Follow up plan: Return in about 2 months (around 05/12/2024) for chronic Diseases Managemnt.   Continue healthy lifestyle choices, including diet (rich in fruits, vegetables, and lean proteins, and low in salt and simple carbohydrates) and exercise (at least 30 minutes of moderate physical activity daily).  Educational handout given for   Clinical References  Managing Depression, Adult Depression is a mental health condition that affects your thoughts, feelings, and actions. Being diagnosed with depression can bring you relief if you did not know why you have felt or behaved a certain way. It could also leave you feeling overwhelmed. Finding ways to manage your symptoms can help you feel more positive about your future. How to manage lifestyle changes Being depressed is difficult. Depression can increase the level of everyday stress. Stress can make depression symptoms worse. You may believe your symptoms cannot be managed or will never improve. However, there are many things you can try  to help manage your symptoms. There is hope. Managing stress  Stress is your body's reaction to life changes and events, both good and bad. Stress can add to your feelings of depression. Learning to manage your stress can help lessen your feelings of depression. Try some of the following approaches to reducing your stress (stress reduction techniques): Listen to music that you enjoy and that inspires you. Try using a meditation app or take a meditation class. Develop a practice that helps you connect with your spiritual self. Walk in nature, pray, or go to a place of worship. Practice deep breathing. To do this, inhale slowly through your nose. Pause at the top of your inhale for a few seconds and then exhale slowly, letting yourself relax. Repeat this three or four times. Practice yoga to help relax and work your muscles. Choose a stress reduction technique that works for you. These techniques take time and practice to develop. Set aside 5-15 minutes a day to do them. Therapists can offer training in these techniques. Do these things to help manage stress: Keep a journal. Know your limits. Set healthy boundaries for yourself and others, such as  saying no when you think something is too much. Pay attention to how you react to certain situations. You may not be able to control everything, but you can change your reaction. Add humor to your life by watching funny movies or shows. Make time for activities that you enjoy and that relax you. Spend less time using electronics, especially at night before bed. The light from screens can make your brain think it is time to get up rather than go to bed.   Medicines Medicines, such as antidepressants, are often a part of treatment for depression. Talk with your pharmacist or health care provider about all the medicines, supplements, and herbal products that you take, their possible side effects, and what medicines and other products are safe to take  together. Make sure to report any side effects you may have to your health care provider. Relationships Your health care provider may suggest family therapy, couples therapy, or individual therapy as part of your treatment. How to recognize changes Everyone responds differently to treatment for depression. As you recover from depression, you may start to: Have more interest in doing activities. Feel more hopeful. Have more energy. Eat a more regular amount of food. Have better mental focus. It is important to recognize if your depression is not getting better or is getting worse. The symptoms you had in the beginning may return, such as: Feeling tired. Eating too much or too little. Sleeping too much or too little. Feeling restless, agitated, or hopeless. Trouble focusing or making decisions. Having unexplained aches and pains. Feeling irritable, angry, or aggressive. If you or your family members notice these symptoms coming back, let your health care provider know right away. Follow these instructions at home: Activity Try to get some form of exercise each day, such as walking. Try yoga, mindfulness, or other stress reduction techniques. Participate in group activities if you are able. Lifestyle Get enough sleep. Cut down on or stop using caffeine, tobacco, alcohol, and any other harmful substances. Eat a healthy diet that includes plenty of vegetables, fruits, whole grains, low-fat dairy products, and lean protein. Limit foods that are high in solid fats, added sugar, or salt (sodium). General instructions Take over-the-counter and prescription medicines only as told by your health care provider. Keep all follow-up visits. It is important for your health care provider to check on your mood, behavior, and medicines. Your health care provider may need to make changes to your treatment. Where to find support Talking to others  Friends and family members can be sources of support  and guidance. Talk to trusted friends or family members about your condition. Explain your symptoms and let them know that you are working with a health care provider to treat your depression. Tell friends and family how they can help. Finances Find mental health providers that fit with your financial situation. Talk with your health care provider if you are worried about access to food, housing, or medicine. Call your insurance company to learn about your co-pays and prescription plan. Where to find more information You can find support in your area from: Anxiety and Depression Association of America (ADAA): adaa.org Mental Health America: mentalhealthamerica.net The First American on Mental Illness: nami.org Contact a health care provider if: You stop taking your antidepressant medicines, and you have any of these symptoms: Nausea. Headache. Light-headedness. Chills and body aches. Not being able to sleep (insomnia). You or your friends and family think your depression is getting worse. Get help right away if: You  have thoughts of hurting yourself or others. Get help right away if you feel like you may hurt yourself or others, or have thoughts about taking your own life. Go to your nearest emergency room or: Call 911. Call the National Suicide Prevention Lifeline at 726-779-3869 or 988. This is open 24 hours a day. Text the Crisis Text Line at 281-300-8992. This information is not intended to replace advice given to you by your health care provider. Make sure you discuss any questions you have with your health care provider. Document Revised: 06/18/2021 Document Reviewed: 06/18/2021 Elsevier Patient Education  2024 [employer]. Supporting Someone With Depression Depression is a mental health condition that affects the way a person feels, thinks, and handles daily activities such as eating, sleeping, and working. When a person has depression, his or her condition can affect others around  him or her, such as friends and family members. Friends and family can help by offering support and understanding. What do I need to know about this condition? The main symptoms of depression are: Constant depressed or irritable mood. Loss of interest in things and activities that were enjoyed in the past. Other symptoms of depression include: Fatigue. Sleeping too much or too little. Difficulty falling asleep, or waking up early and not being able to get back to sleep. Difficulty concentrating or making decisions. Changes in appetite and weight. Staying away from others (isolating oneself). Expressing feelings of guilt. Expressing suicidal thoughts or feelings. What do I need to know about the treatment options? This condition is usually treated by mental health providers such as psychologists, psychiatrists, psychiatric nurse practitioners, and clinical social workers. Treatment may include one or more of the following: Psychotherapy, also called talk therapy or counseling. Types of psychotherapy include individual or group therapy, and they usually involve the following approaches: Cognitive behavioral therapy (CBT). This type of therapy teaches a person how to recognize feelings, thoughts, and behaviors that contribute to depression. The person is taught to make a choice about how to respond to these feelings, thoughts, and behaviors so that he or she can experience fewer symptoms. Interpersonal therapy (IPT). This helps to improve the way that someone with depression relates to and communicates with others. This type of therapy may involve caregivers, friends, and family members. Family therapy. This treatment helps family members communicate and deal with conflict in healthy ways. Medicine. This is often used to help with certain emotions and behaviors. Combining medicine and therapy is often the most effective approach. The following lifestyle changes may also help with managing symptoms  of depression: Limiting alcohol and drug use. Exercising regularly. Getting enough good-quality sleep. Making healthy eating choices. Reducing distressing situations. Spending time outside. Following regular daily routines. How can I support a person with depression? Talk about the condition Good communication is the key to supporting a person with depression. Here are a few things to keep in mind: Ask the person how you can support him or her. Respect the person's right to make decisions. Be careful about too much prodding. Try not to overdo reminders to an adult about things like taking medicines. Ask the person how he or she prefers that you help. Be encouraging and offer emotional support. This can help to lower stress. Even saying something simple to comfort the person may help. Listening is very important. Be available if the person wants to talk. Make an effort to acknowledge his or her feelings, and stay calm and realistic. Never ignore comments about suicide, and do  not try to avoid the subject of suicide. Talking about suicide will not make the person want to act on it. You or the person with depression can reach out 24 hours a day to get free, private support (on the phone or a live online chat) from a suicide crisis helpline, such as the National Suicide Prevention Lifeline at (250) 169-5025 or 988 in the [place] Find support and resources A health care provider may be able to recommend mental health resources that are available online or over the phone. You could start with: Government sites such as the Substance Abuse and Mental Health Services Administration (SAMHSA): skateoasis.com.pt SAMHSA'S Helpline: 1-800-662-HELP (4357). National mental health organizations such as the [employer] on Mental Illness (NAMI): www.nami.org You may also consider: Joining self-help and support groups, not only for the person with depression, but also for yourself. People in these peer and  family support groups understand what you and the person with depression are going through. They can help you feel a sense of hope and connect you with local resources to help you learn more. Attending family therapy with the person you are supporting. General support Make an effort to learn all you can about depression. Help the person with depression follow his or her treatment plan as directed by health care providers. This could mean driving him or her to therapy sessions or suggesting ways to manage stress. Ask the person if you may join him or her for a therapy session or go with him or her to health care visits. Joining the person with his or her permission can give you an opportunity to learn how to be more supportive. Include the person with depression in activities. Invite her or him to go for walks and outings. At first, she or he may not want to go, but keep trying. Be patient anddo not expect the person to do too much too soon. Help with daily responsibilities, such as laundry or meals. Sometimes daily tasks seem overwhelming to a person with depression. Remember that your support really matters. Social support is a huge benefit for someone who is coping with depression. How can I create a safe environment? If the person with depression feels unable to control his or her behavior, it may be necessary to take steps to keep his or her home safe. Such steps may include: Locking up alcohol and prescription pills that he or she may turn to. Count prescription pills often. You may want to consider removing alcohol from the home. Removing or locking up guns and other weapons. If you do not have a safe place to keep a gun, local law enforcement may store a gun for you. Making a written crisis plan. Include important phone numbers, such as the local crisis intervention team. Make sure that: The person with depression knows about this plan. Everyone who has regular contact with this person knows  about the plan and knows what to do in an emergency. How should I care for myself? It is important to find ways to care for your body, mind, and well-being while supporting someone with depression. Spend time with friends and family. Find someone you can talk to who will also help you work on using coping skills to manage stress. Consider seeking therapy for yourself. Try to maintain your normal routines. This can help you remember that your life is about more than the condition of the person with depression. Understand what your limits are. Say no to requests or events that lead  to a schedule that is too busy. Make time for activities that help you relax, and try to not feel guilty about taking time for yourself. Consider trying meditation and deep breathing exercises to lower stress. Get plenty of sleep. Exercise, even if it is just taking a short walk a few times a week. What are some signs that the condition is getting worse? Signs that the person's condition may be getting worse include: Symptoms return or get worse. Not taking medicines or attending therapy as prescribed. Having more trouble sleeping or doing everyday activities. Withdrawal from friends and family. Get help right away if the person you support: Expresses serious thoughts about self-harm or about hurting others. Sees, hears, tastes, smells, or feels things that are not present (hallucinations). If you ever feel like your loved one may hurt himself or herself or others, or if he or she shares thoughts about taking his or her own life, get help right away. You can go to your nearest emergency department or: Call your local emergency services (911 in the [place] Call a suicide crisis helpline, such as the National Suicide Prevention Lifeline at (216)285-4510 or 988 in the [place] This is open 24 hours a day in the [place] If youre a Veteran: Call 988 and press 1. This is open 24 hours a day. Text the [employer] at  602-404-9927. Summary Depression is a mental health condition that affects the way a person feels, thinks, and handles daily activities. Depression is usually treated by mental health professionals. Treatment may include psychotherapy, medicine, lifestyle changes, or a combination of these approaches. When you support someone with depression, it is important to keep yourself healthy and safe. Get help right away if the person with depression expresses serious thoughts about self-harm. This information is not intended to replace advice given to you by your health care provider. Make sure you discuss any questions you have with your health care provider. Document Revised: 09/25/2022 Document Reviewed: 05/22/2020 Elsevier Patient Education  2024 [employer] Hyperglycemia Hyperglycemia is when the amount of sugar, or glucose, in your blood is too high. High blood sugar can happen if you have diabetes or if you don't have diabetes. It may be an emergency. What are the causes? If you have diabetes, high blood sugar may be caused by: Medicines that increase blood sugar. Not giving yourself enough insulin  (if you take it). Being less active than normal. Eating more than planned. Illness, an injury, or an infection. Having surgery. Stress. If you don't have diabetes, high blood sugar may be caused by: Certain medicines, such as steroids or thiazide diuretics. Stress. A bad illness or infection. Having surgery. Diseases of the pancreas. What increases the risk? You're more likely to have high blood sugar if: Someone in your family has diabetes. You're overweight. You aren't active. You have or have had: Prediabetes. Diabetes when pregnant. Polycystic ovarian syndrome (PCOS). What are the signs or symptoms? High blood sugar may not cause symptoms. If you do have symptoms, they may include: Feeling more thirsty than normal. Needing to pee more often than normal. Hunger. Feeling very  tired. Blurry eyesight. You may have other symptoms if your high blood sugar isn't treated. These may include: Pain in your belly. A headache. Weakness. Weight loss that's not planned. A tingling or numb feeling in your hands or feet. Cuts or bruises that heal slowly. How is this diagnosed?  High blood sugar is diagnosed with a blood test. This test tells you how  much sugar is in your blood. It's done while you're having symptoms.  Your health care provider may also do a physical exam, look at your medical history, and do more blood tests. These tests may include: A fasting blood glucose (FBG) test. You can't eat for at least 8 hours before this test. An A1C test. A glucose tolerance test. How is this treated? Treatment may include: Taking medicine to control your blood sugar levels. Changing your medicine or how much you take if you take insulin  or other diabetes medicines. Checking your blood sugar more often. Making changes to your daily life. These may include: Being more active. Eating healthier foods. Losing weight. Treating an illness or infection. Stopping or taking less steroids. If your high blood sugar stays high, you may need to be treated in the hospital. Follow these instructions at home: If you have diabetes:  Know the symptoms of high blood sugar. Follow your diabetes care plan. Make sure you: Take insulin  and medicines as told. Check your blood sugar as often as told. Eat on time. Do not skip meals. Check your blood sugar before and after you exercise. If you exercise longer or harder than normal, check your blood sugar more often. Follow your sick day plan when you can't eat or drink like normal. Make this plan ahead of time with your provider. Share your diabetes care plan with: Your work or school. The people you live with. Wear an alert bracelet or carry a card that says you have diabetes. General instructions Take medicines only as told by your  provider. Drink enough fluid to keep your pee (urine) pale yellow. Make sure you drink enough when you: Exercise. Get sick. Are in hot places. If you drink alcohol: Limit how much you have to: 0-1 drink a day if you're female. 0-2 drinks a day if you're female. Know how much alcohol is in your drink. In the [place] one drink is one 12 oz bottle of beer (355 mL), one 5 oz glass of wine (148 mL), or one 1 oz glass of hard liquor (44 mL). Manage stress. If you need help with this, ask your provider. Exercise as told. Try to stay at a healthy weight. Keep all follow-up visits. Your provider will want to make sure your high blood sugar is treated. Where to find more information American Diabetes Association (ADA): diabetes.org Contact a health care provider if: You have diabetes and have trouble keeping your blood sugar in the right range. Your blood sugar is at or above 240 mg/dL (86.6 mmol/L) for 2 days in a row. You have high blood sugar often. You have signs of illness, such as: Nausea or vomiting. A headache. A fever. You can't stop vomiting. Get help right away if: Your blood sugar monitor reads high even when you're taking insulin . You have trouble breathing. These symptoms may be an emergency. Call 911 right away. Do not wait to see if the symptoms will go away. Do not drive yourself to the hospital. This information is not intended to replace advice given to you by your health care provider. Make sure you discuss any questions you have with your health care provider. Document Revised: 11/13/2022 Document Reviewed: 04/30/2022 Elsevier Patient Education  2024 [employer]  The above assessment and management plan was discussed with the patient. The patient verbalized understanding of and has agreed to the management plan. Patient is aware to call the clinic if they develop any new symptoms or if symptoms persist or  worsen. Patient is aware when to return to the clinic for a  follow-up visit. Patient educated on when it is appropriate to go to the emergency department.   Nena Deitra Morton Sebastian, [facility] [address] [place], [place] [address] [phone]       [1]  Allergies Allergen Reactions   Metformin  And Related Diarrhea    Intestinal cramps   Prozac [Fluoxetine] Other (See Comments)    Reports suicidal thoughts with prozac or xanax--not willing to risk being on either medication   Xanax [Alprazolam] Other (See Comments)    Reports suicidal thoughts with prozac or xanax--not willing to risk being on either medication    "

## 2024-03-19 LAB — ANAEROBIC AND AEROBIC CULTURE

## 2024-03-21 ENCOUNTER — Ambulatory Visit: Payer: Self-pay | Admitting: Nurse Practitioner

## 2024-04-11 ENCOUNTER — Ambulatory Visit: Admitting: Behavioral Health

## 2024-05-13 ENCOUNTER — Ambulatory Visit: Admitting: Family Medicine

## 2024-05-19 ENCOUNTER — Ambulatory Visit: Admitting: Nurse Practitioner
